# Patient Record
Sex: Female | Born: 1970 | Race: White | Hispanic: No | Smoking: Current some day smoker
Health system: Southern US, Community
[De-identification: ages and names within clinical notes are randomized; demographics above are authoritative.]

## PROBLEM LIST (undated history)

## (undated) DIAGNOSIS — K219 Gastro-esophageal reflux disease without esophagitis: Secondary | ICD-10-CM

## (undated) DIAGNOSIS — I10 Essential (primary) hypertension: Secondary | ICD-10-CM

## (undated) DIAGNOSIS — O009 Unspecified ectopic pregnancy without intrauterine pregnancy: Secondary | ICD-10-CM

## (undated) DIAGNOSIS — E785 Hyperlipidemia, unspecified: Secondary | ICD-10-CM

## (undated) HISTORY — DX: Hyperlipidemia, unspecified: E78.5

## (undated) HISTORY — PX: OTHER SURGICAL HISTORY: SHX169

## (undated) HISTORY — DX: Unspecified ectopic pregnancy without intrauterine pregnancy: O00.90

## (undated) HISTORY — DX: Essential (primary) hypertension: I10

## (undated) HISTORY — DX: Gastro-esophageal reflux disease without esophagitis: K21.9

## (undated) HISTORY — PX: ECTOPIC PREGNANCY SURGERY: SHX613

---

## 2002-03-25 ENCOUNTER — Ambulatory Visit (HOSPITAL_COMMUNITY): Admission: AD | Admit: 2002-03-25 | Discharge: 2002-03-25 | Payer: Self-pay | Admitting: Obstetrics and Gynecology

## 2002-03-25 ENCOUNTER — Encounter (INDEPENDENT_AMBULATORY_CARE_PROVIDER_SITE_OTHER): Payer: Self-pay | Admitting: Specialist

## 2002-03-25 ENCOUNTER — Encounter: Payer: Self-pay | Admitting: Obstetrics and Gynecology

## 2002-07-12 ENCOUNTER — Other Ambulatory Visit: Admission: RE | Admit: 2002-07-12 | Discharge: 2002-07-12 | Payer: Self-pay | Admitting: Obstetrics and Gynecology

## 2002-08-20 ENCOUNTER — Encounter: Payer: Self-pay | Admitting: Obstetrics and Gynecology

## 2002-08-20 ENCOUNTER — Ambulatory Visit (HOSPITAL_COMMUNITY): Admission: RE | Admit: 2002-08-20 | Discharge: 2002-08-20 | Payer: Self-pay | Admitting: Obstetrics and Gynecology

## 2003-04-21 ENCOUNTER — Encounter: Payer: Self-pay | Admitting: Obstetrics and Gynecology

## 2003-04-21 ENCOUNTER — Ambulatory Visit (HOSPITAL_COMMUNITY): Admission: AD | Admit: 2003-04-21 | Discharge: 2003-04-21 | Payer: Self-pay | Admitting: Obstetrics and Gynecology

## 2003-04-21 ENCOUNTER — Encounter (INDEPENDENT_AMBULATORY_CARE_PROVIDER_SITE_OTHER): Payer: Self-pay

## 2003-04-24 ENCOUNTER — Encounter: Admission: RE | Admit: 2003-04-24 | Discharge: 2003-04-24 | Payer: Self-pay | Admitting: Obstetrics and Gynecology

## 2003-04-24 ENCOUNTER — Encounter: Payer: Self-pay | Admitting: Obstetrics and Gynecology

## 2003-07-17 ENCOUNTER — Other Ambulatory Visit: Admission: RE | Admit: 2003-07-17 | Discharge: 2003-07-17 | Payer: Self-pay | Admitting: Obstetrics and Gynecology

## 2003-07-22 ENCOUNTER — Encounter: Admission: RE | Admit: 2003-07-22 | Discharge: 2003-07-22 | Payer: Self-pay | Admitting: Obstetrics and Gynecology

## 2004-07-17 ENCOUNTER — Other Ambulatory Visit: Admission: RE | Admit: 2004-07-17 | Discharge: 2004-07-17 | Payer: Self-pay | Admitting: Obstetrics and Gynecology

## 2004-08-20 ENCOUNTER — Ambulatory Visit: Payer: Self-pay | Admitting: Professional

## 2005-03-01 ENCOUNTER — Ambulatory Visit: Payer: Self-pay | Admitting: Internal Medicine

## 2005-03-02 ENCOUNTER — Encounter: Admission: RE | Admit: 2005-03-02 | Discharge: 2005-03-02 | Payer: Self-pay | Admitting: Internal Medicine

## 2005-03-02 IMAGING — CT CT PELVIS W/ CM
1 of 4 series · 12 of 32 positions shown, 18 images · IV contrast (READICAT/WATER)
Comparison: none

CLINICAL DATA: One week right lower quadrant pain.  Hematuria.  Post op LAP x?s 3.  Nausea.  Diarrhea. 
ABDOMINAL CT PRE AND POST CONTRAST:
TECHNIQUE: Multidetector CT imaging of the abdomen was performed both before and during bolus administration of intravenous contrast.
Contrast:  125 cc Omnipaque 300
TECHNIQUE: Multidetector CT imaging of the pelvis was performed following the standard protocol during bolus administration of intravenous contrast.

[Series 3: routine abdomen · axial · 0.84mm/px · z∈[-429,-29]mm · 12 of 94 slices shown, 18 images]
[im 7/94  soft-tissue]
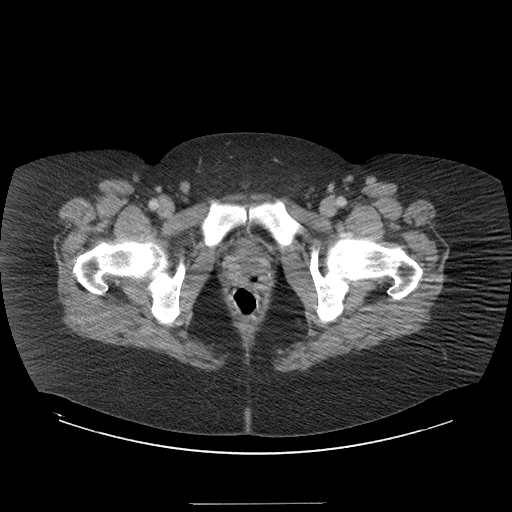
[im 7/94  bone]
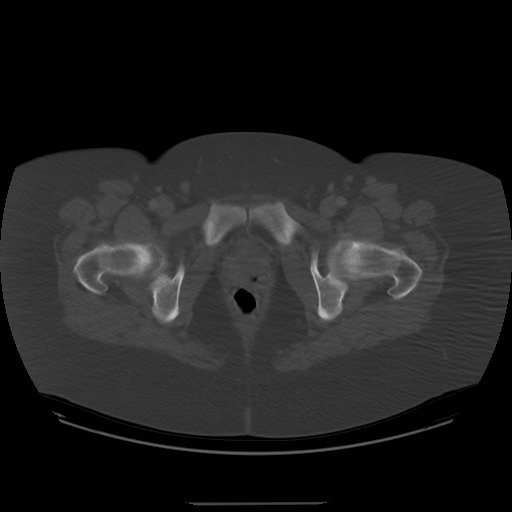
[im 14/94  soft-tissue]
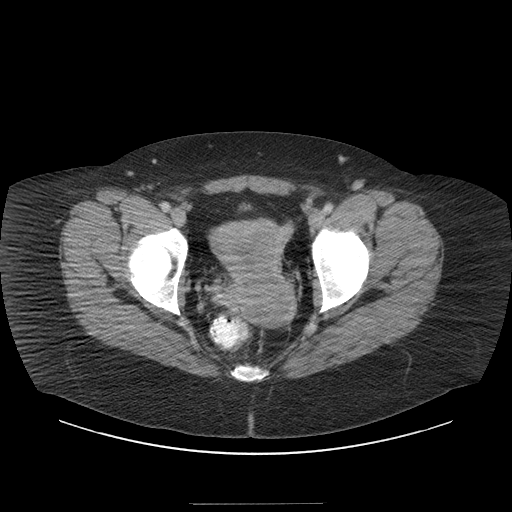
[im 20/94  soft-tissue]
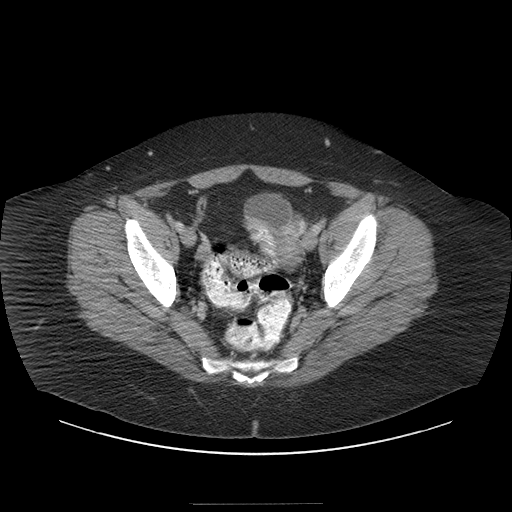
[im 27/94  soft-tissue]
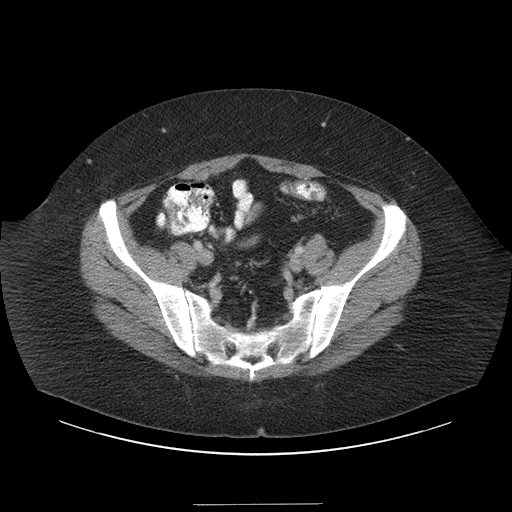
[im 34/94  soft-tissue]
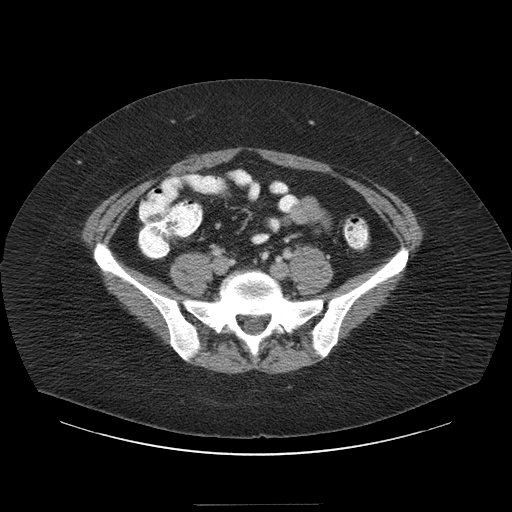
[im 40/94  soft-tissue]
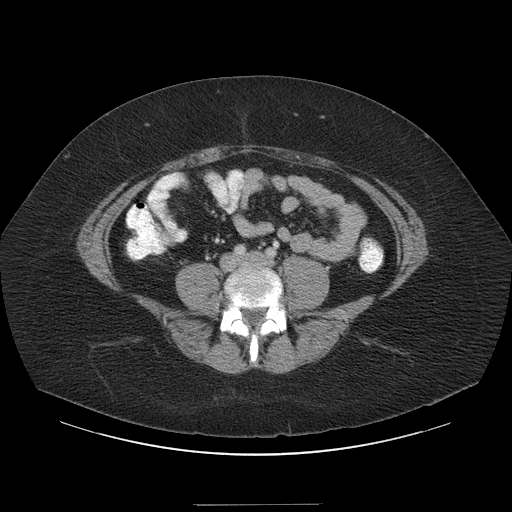
[im 54/94  soft-tissue]
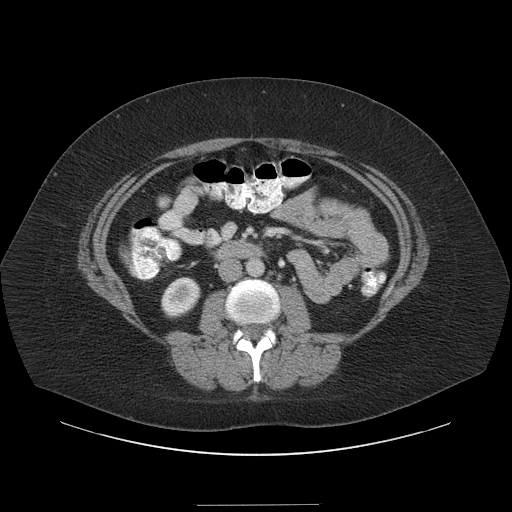
[im 60/94  soft-tissue]
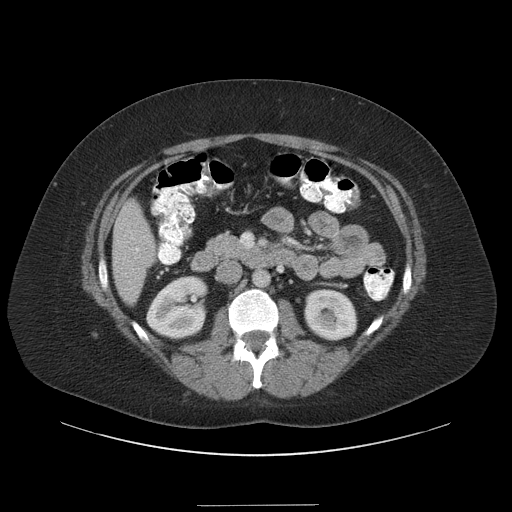
[im 67/94  soft-tissue]
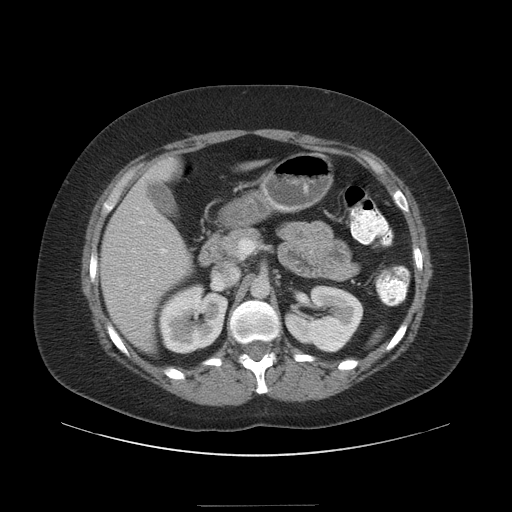
[im 67/94  lung]
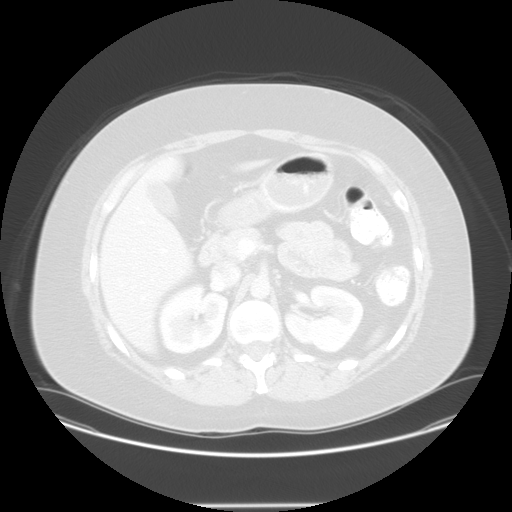
[im 67/94  bone]
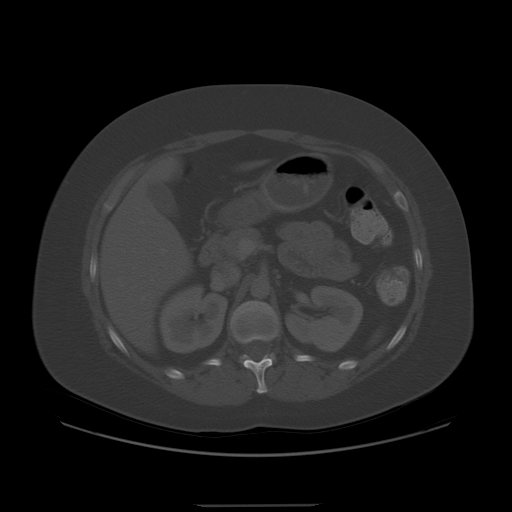
[im 74/94  soft-tissue]
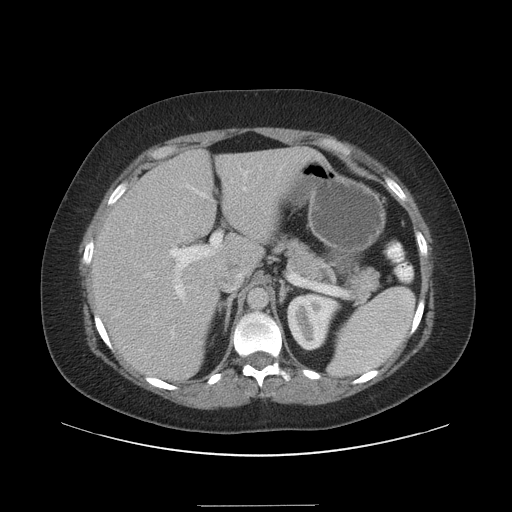
[im 74/94  lung]
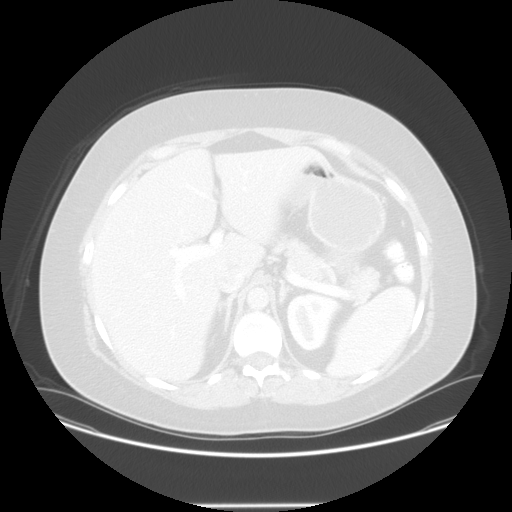
[im 80/94  soft-tissue]
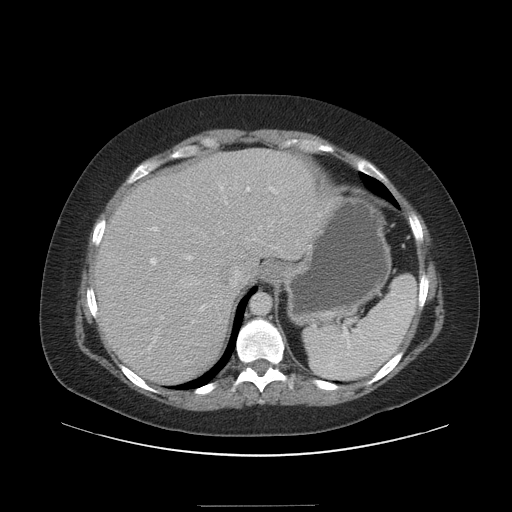
[im 80/94  lung]
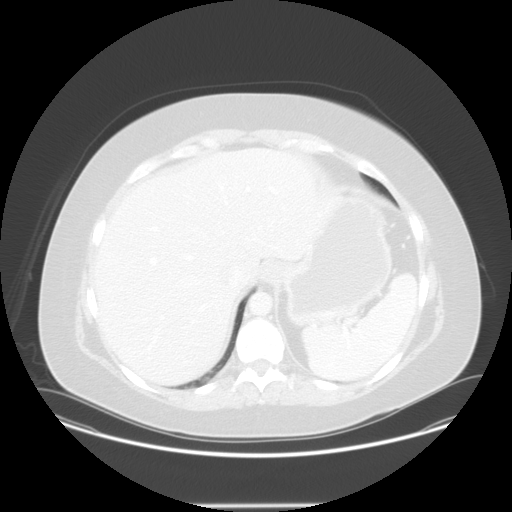
[im 87/94  soft-tissue]
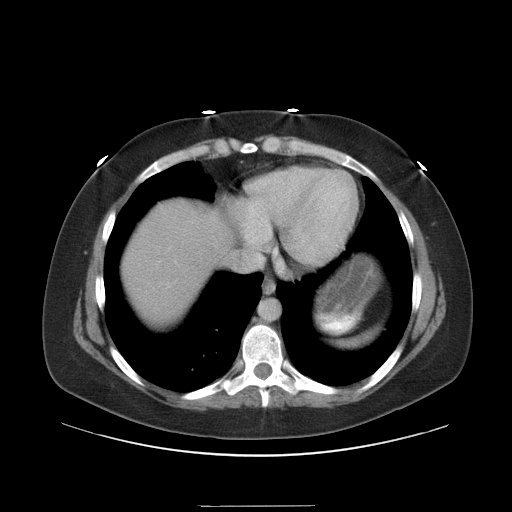
[im 87/94  lung]
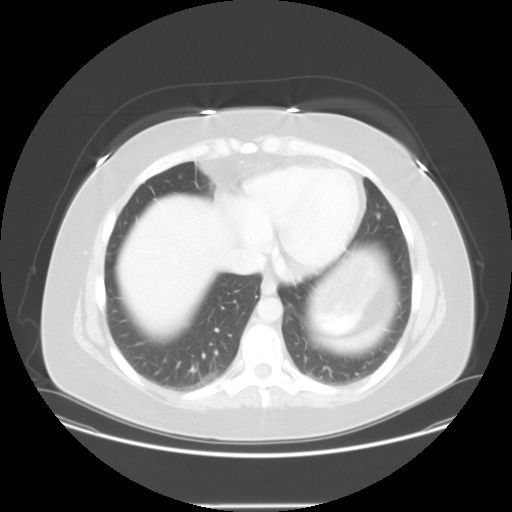

[12 of 32 positions shown; findings below may reference images not displayed]

FINDINGS: There is no evidence of renal calculi or hydronephrosis.  There is no evidence of mass,inflammatory process, or abdominal fluid collection.  
Since previous [REDACTED] abdominal CT report, [DATE], the previous 5 mm posterior right hepatic low density focus is not seen probably due to different slice level currently.  Slight diffuse fatty infiltration of the liver is noted.  Cause of microhematuria is not determined on current study.  Left retroperitoneal lymph nodes measure upper limits of normal with reference lymph node measuring 10 x 7 mm (image 43).
IMPRESSION: Since previous [REDACTED] abdominal CT report of [DATE]:
1.  Nonvisualization of previous 5 mm likely hepatic cyst with current slight diffuse fatty infiltration of the liver.
2.  Retroperitoneal lymph nodes upper limits of normal.
3.  Otherwise negative.
PELVIS CT WITH CONTRAST:
FINDINGS: Since previous [REDACTED] pelvic CT report of [DATE], there is resolution of previously described 15 mm right ovarian cyst.  Interval development of multiloculated complex left ovarian cystic mass measures approximately 4 cm long x 7.3 cm AP x 4.3 cm wide (image 76).  Remaining opacified intestine, uterus, right adnexa and bladder are normal by CT.  No inflammation, free fluid, nor adenopathy is seen.  No residual post procedure periumbilical abdominal wall changes are currently noted.  Incidental small left inguinal hernia containing fat only is seen.
IMPRESSION: Since previous [REDACTED] pelvic CT report of [DATE]:
1.  Interval resolution post procedure periumbilical abdominal wall changes and resolution of previous 15 mm right ovarian cyst. 
2.  Development of complex left ovarian cystic mass consistent with primary benign or malignant cystic ovarian tumors. 
3.  Small left inguinal hernia.
4.  Otherwise negative ? etiology of microhematuria is not determined on current study.

## 2005-03-03 ENCOUNTER — Encounter: Admission: RE | Admit: 2005-03-03 | Discharge: 2005-03-03 | Payer: Self-pay | Admitting: Obstetrics and Gynecology

## 2005-03-03 IMAGING — US US TRANSVAGINAL NON-OB
1 series · 13 of 25 positions shown · non-contrast
Comparison: CT abdomen and pelvis [DATE].

CLINICAL DATA: Followup left ovarian mass identified on recent CT.

TRANSABDOMINAL AND TRANSVAGINAL PELVIC ULTRASOUND [DATE]:
TECHNIQUE: Initially, transabdominal imaging was performed using the bladder as
an acoustical window. Subsequently, the bladder was emptied and transvaginal
imaging was performed.

[Series 1: unknown · 0.26mm/px · 13 of 47 slices shown]
[im 1/47]
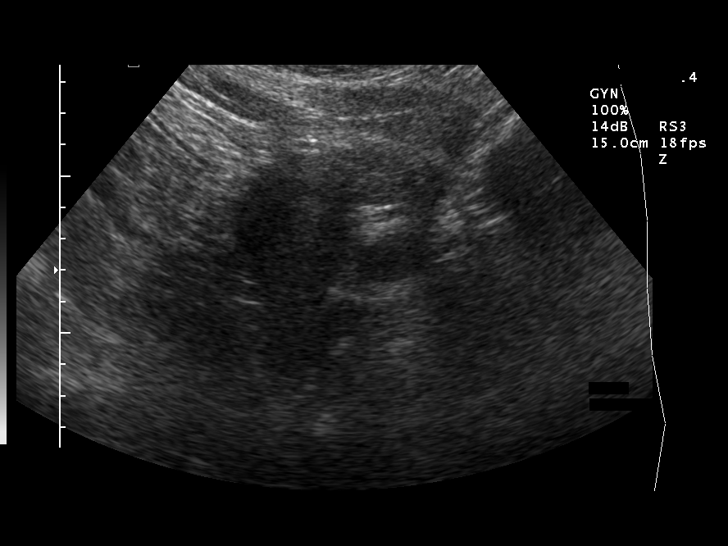
[im 4/47]
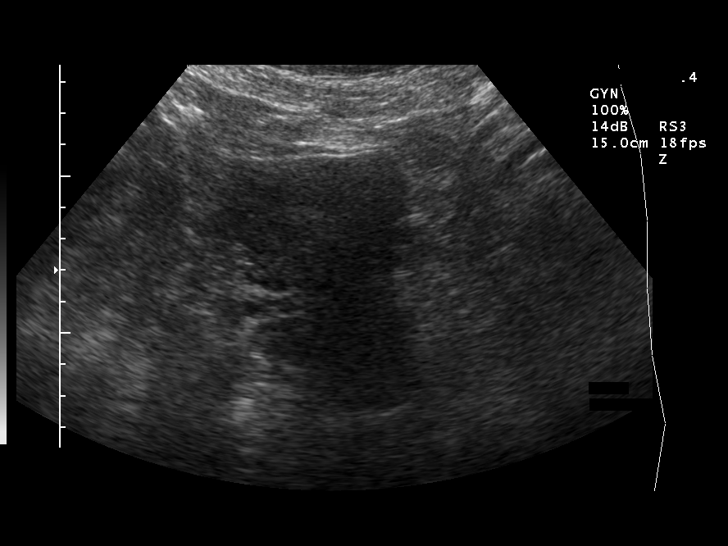
[im 8/47]
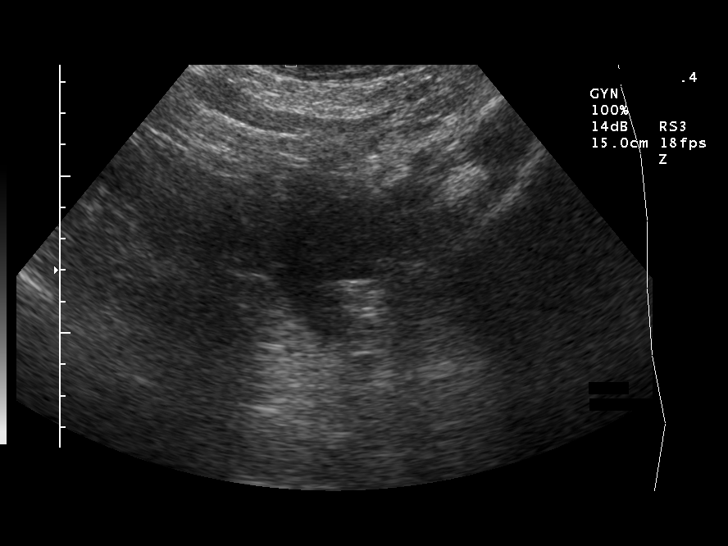
[im 12/47]
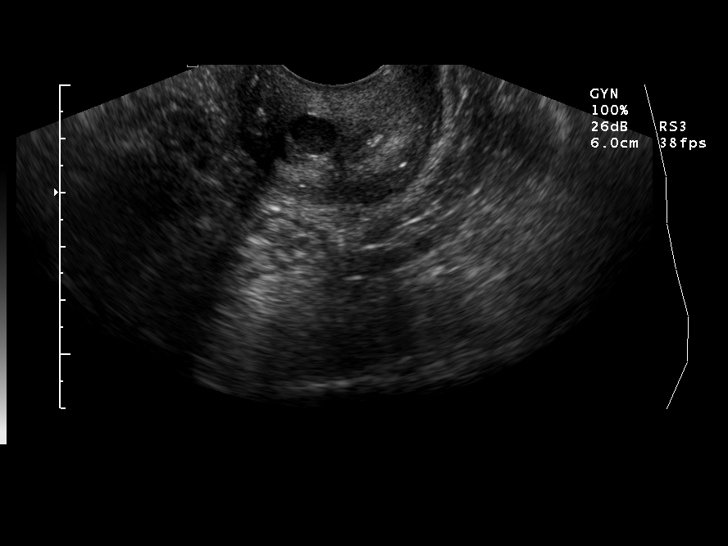
[im 16/47]
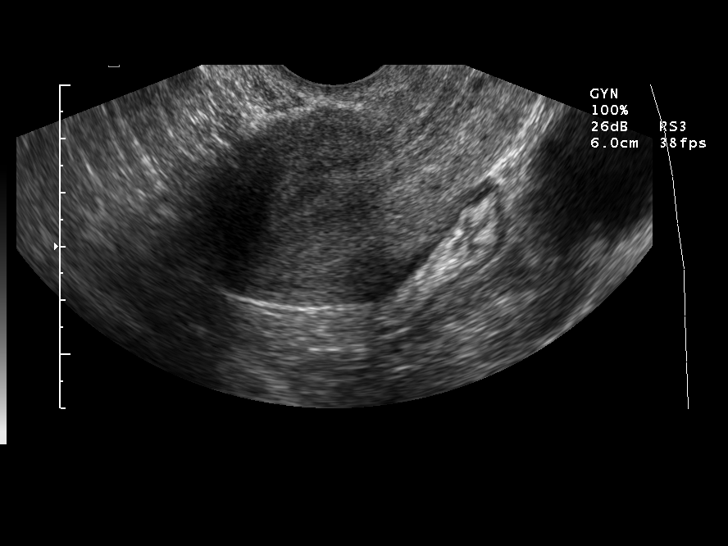
[im 20/47]
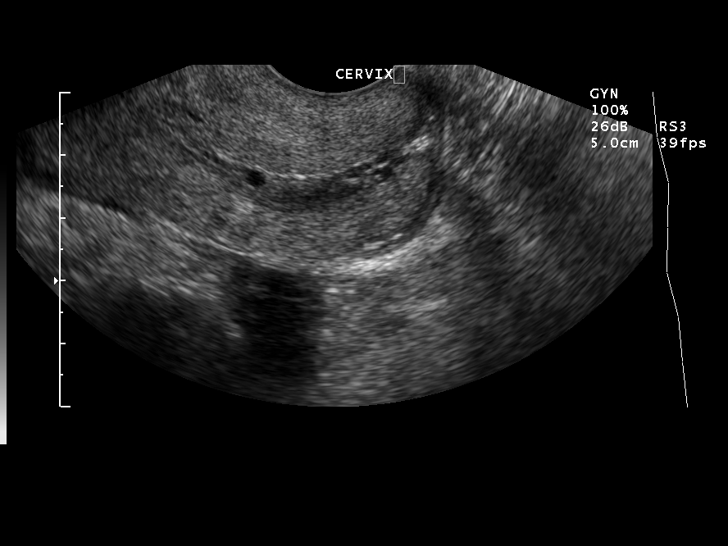
[im 24/47]
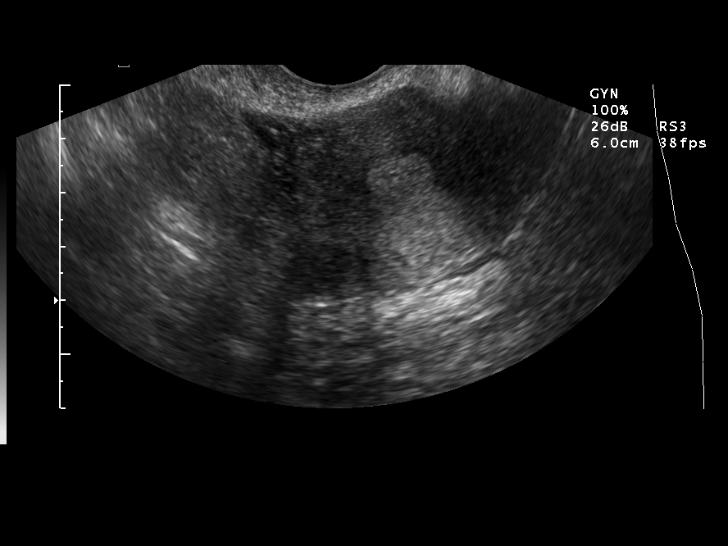
[im 27/47]
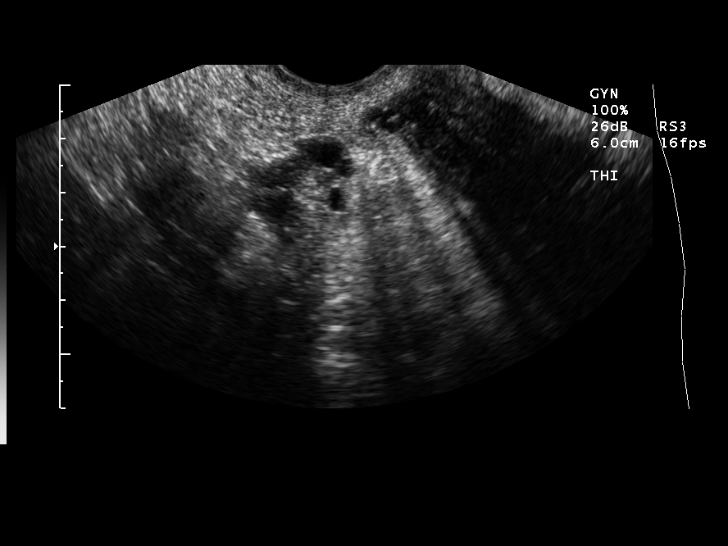
[im 31/47]
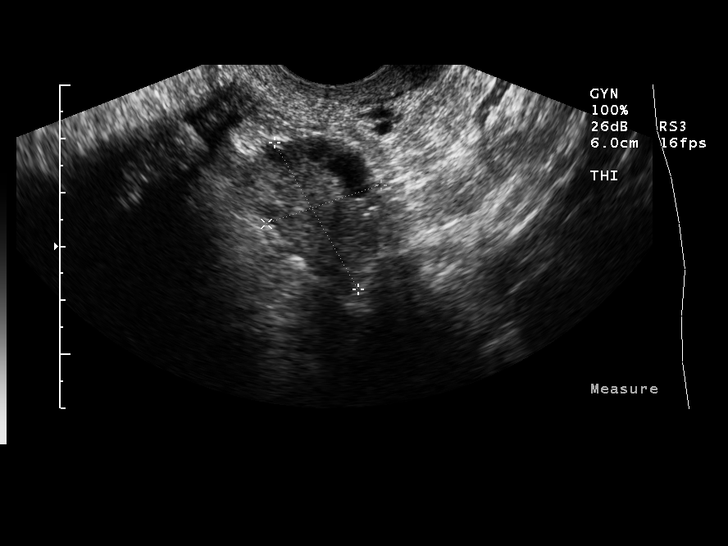
[im 35/47]
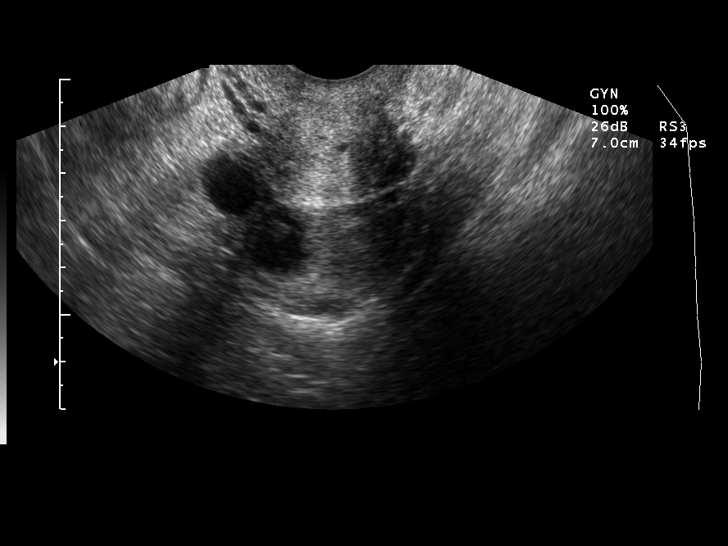
[im 39/47]
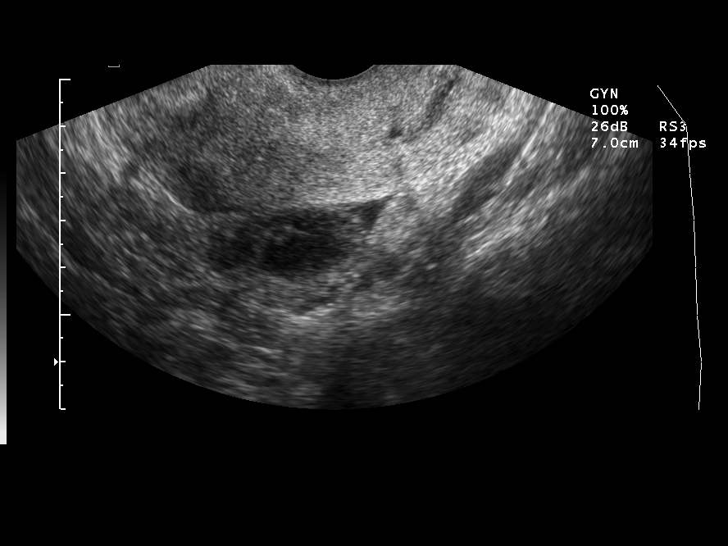
[im 43/47]
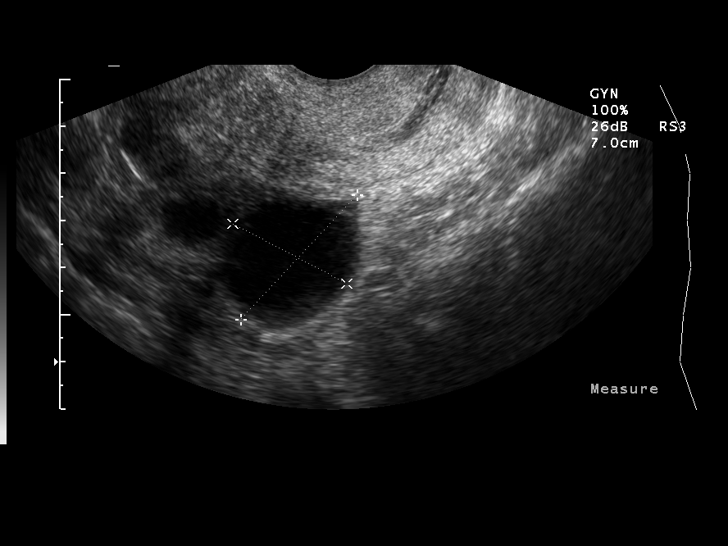
[im 47/47]
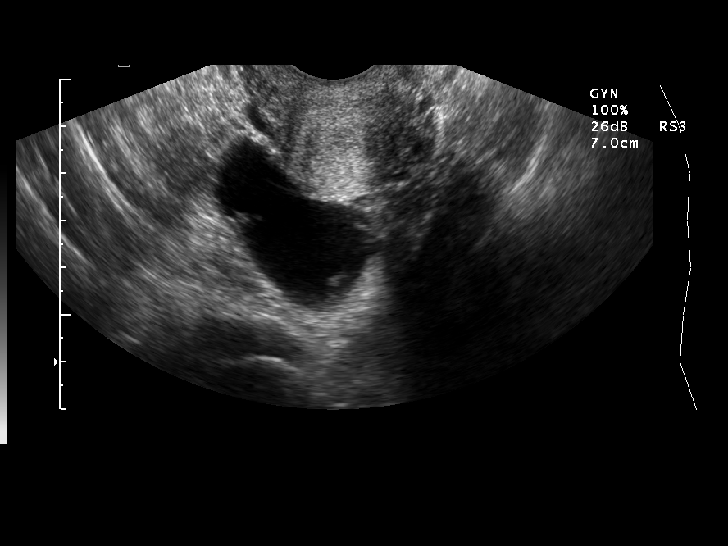

[13 of 25 positions shown; findings below may reference images not displayed]

FINDINGS: Ultrasound confirms a mixed cystic and solid lesion arising from the
left ovary. The cystic component has thick septations. The 2 largest cystic foci
measure approximately 3.6 x 2.7 x 3.3 cm and 2.0 x 1.0 x 1.7 cm. The solid
component measures approximately 3.4 x 2.4 x 4.2 cm. Within the cystic lesions,
there are scattered mural nodules.

The right ovary is normal in size and contains several subcentimeter follicular
cysts. It measures approximately 2.2 x 3.1 x 2.1 cm. No right ovarian masses are
identified. No free fluid was identified.

The uterus is normal in size measuring approximately 7.3 x 3.7 x 5.9 cm. There
is a solitary posterior fundal fibroid measuring 1.4 x 0.9 x 1.4 cm. The
endometrium is normal in appearance and measures approximately 8 mm in
thickness. Nabothian cyst is noted on the cervix.
IMPRESSION: 1. Mixed cystic and solid lesion arising from the left ovary as noted on the CT
yesterday. This is worrisome for an ovarian neoplasm.
2. Normal appearing right ovary.
3. Solitary 1.4 cm posterior fundal uterine fibroid. 
4. Nabothian cyst on the cervix.

## 2005-03-05 ENCOUNTER — Ambulatory Visit: Payer: Self-pay | Admitting: Internal Medicine

## 2005-04-13 ENCOUNTER — Observation Stay (HOSPITAL_COMMUNITY): Admission: RE | Admit: 2005-04-13 | Discharge: 2005-04-14 | Payer: Self-pay | Admitting: Obstetrics and Gynecology

## 2005-07-30 ENCOUNTER — Other Ambulatory Visit: Admission: RE | Admit: 2005-07-30 | Discharge: 2005-07-30 | Payer: Self-pay | Admitting: Obstetrics and Gynecology

## 2005-12-08 ENCOUNTER — Encounter: Payer: Self-pay | Admitting: Obstetrics and Gynecology

## 2006-08-12 ENCOUNTER — Ambulatory Visit: Payer: Self-pay | Admitting: Internal Medicine

## 2006-11-14 ENCOUNTER — Ambulatory Visit: Payer: Self-pay | Admitting: Internal Medicine

## 2007-02-16 ENCOUNTER — Ambulatory Visit: Payer: Self-pay | Admitting: Internal Medicine

## 2007-04-03 ENCOUNTER — Ambulatory Visit: Payer: Self-pay | Admitting: Internal Medicine

## 2007-04-03 DIAGNOSIS — R51 Headache: Secondary | ICD-10-CM | POA: Insufficient documentation

## 2007-04-03 DIAGNOSIS — R519 Headache, unspecified: Secondary | ICD-10-CM | POA: Insufficient documentation

## 2007-04-03 LAB — CONVERTED CEMR LAB
BUN: 7 mg/dL (ref 6–23)
Basophils Absolute: 0.1 10*3/uL (ref 0.0–0.1)
Basophils Relative: 0.6 % (ref 0.0–1.0)
CO2: 22 meq/L (ref 19–32)
Calcium: 9.9 mg/dL (ref 8.4–10.5)
Chloride: 107 meq/L (ref 96–112)
Creatinine, Ser: 0.8 mg/dL (ref 0.4–1.2)
Eosinophils Absolute: 0.1 10*3/uL (ref 0.0–0.6)
Eosinophils Relative: 1.1 % (ref 0.0–5.0)
GFR calc Af Amer: 104 mL/min
GFR calc non Af Amer: 86 mL/min
Glucose, Bld: 87 mg/dL (ref 70–99)
HCT: 39.5 % (ref 36.0–46.0)
Hemoglobin: 13.4 g/dL (ref 12.0–15.0)
INR: 0.9 (ref 0.8–1.0)
Lymphocytes Relative: 31 % (ref 12.0–46.0)
MCHC: 34.1 g/dL (ref 30.0–36.0)
MCV: 91.5 fL (ref 78.0–100.0)
Monocytes Absolute: 0.4 10*3/uL (ref 0.2–0.7)
Monocytes Relative: 3.8 % (ref 3.0–11.0)
Neutro Abs: 7.1 10*3/uL (ref 1.4–7.7)
Neutrophils Relative %: 63.5 % (ref 43.0–77.0)
Platelets: 362 10*3/uL (ref 150–400)
Potassium: 4.1 meq/L (ref 3.5–5.1)
Prothrombin Time: 11.3 s (ref 10.9–13.3)
RBC: 4.31 M/uL (ref 3.87–5.11)
RDW: 12.7 % (ref 11.5–14.6)
Sed Rate: 10 mm/hr (ref 0–25)
Sodium: 138 meq/L (ref 135–145)
WBC: 11.2 10*3/uL — ABNORMAL HIGH (ref 4.5–10.5)
aPTT: 30.1 s — ABNORMAL HIGH (ref 21.7–29.8)

## 2007-04-06 ENCOUNTER — Ambulatory Visit: Payer: Self-pay | Admitting: Cardiology

## 2007-04-06 IMAGING — CT CT HEAD W/O CM
1 series · 16 of 30 positions shown, 20 images · non-contrast
Comparison: none

CLINICAL DATA: Frontal headaches, nausea, foot numbness

[Series 2: head_seq 4.5 h37s st · axial · 0.43mm/px · z∈[-153,-9]mm · 16 of 36 slices shown, 20 images]
[im 2/36  brain]
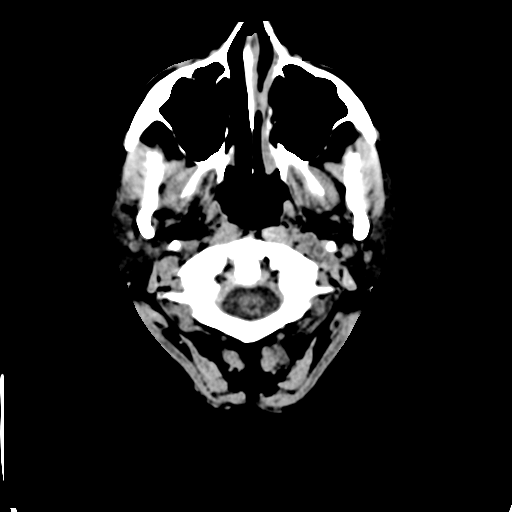
[im 2/36  bone]
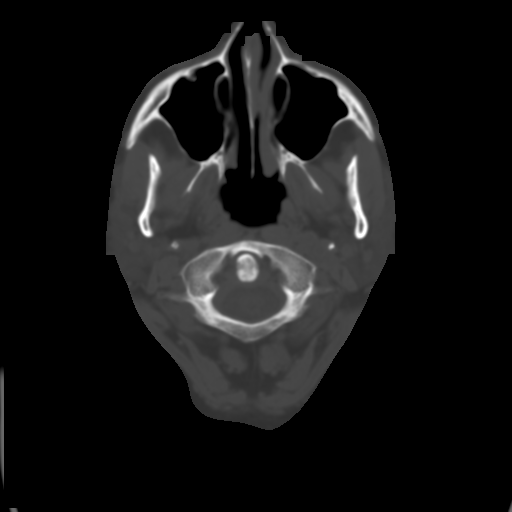
[im 4/36  brain]
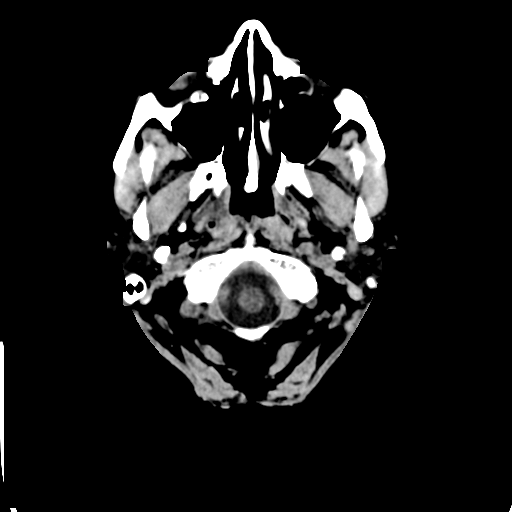
[im 7/36  brain]
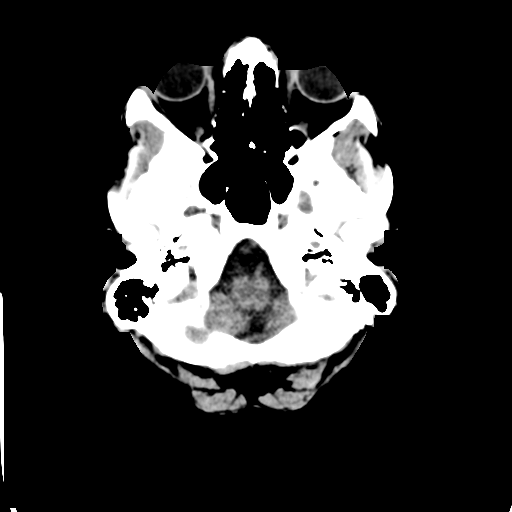
[im 9/36  brain]
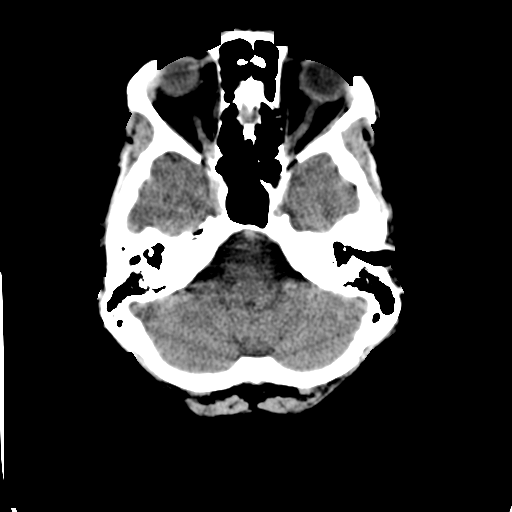
[im 10/36  brain]
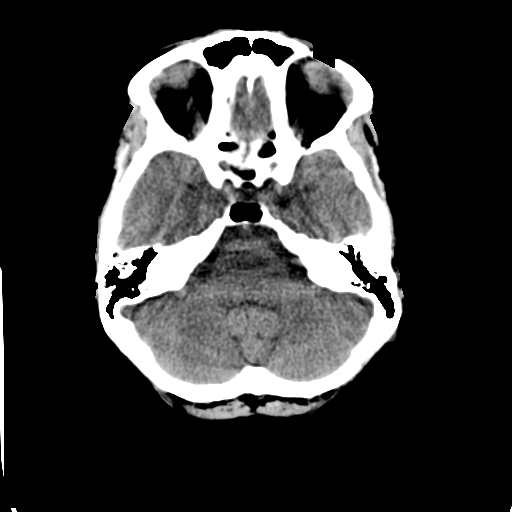
[im 10/36  bone]
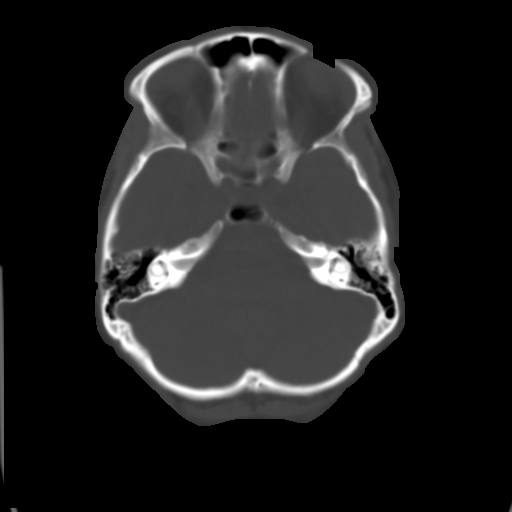
[im 13/36  brain]
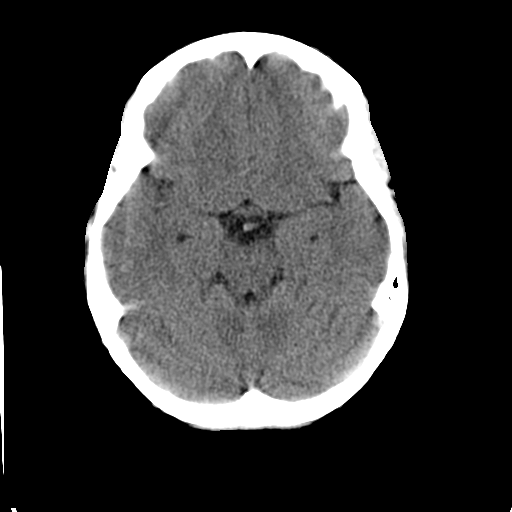
[im 15/36  brain]
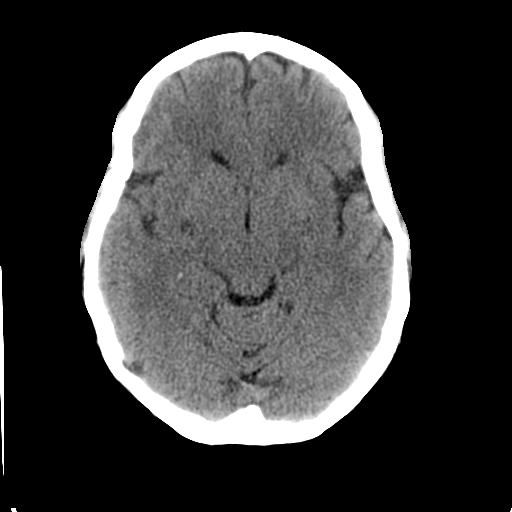
[im 17/36  brain]
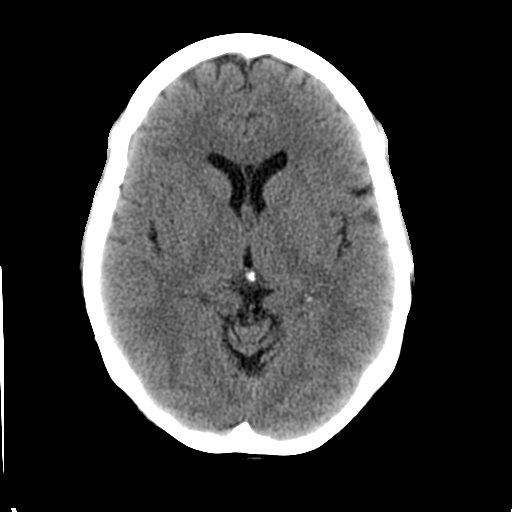
[im 19/36  brain]
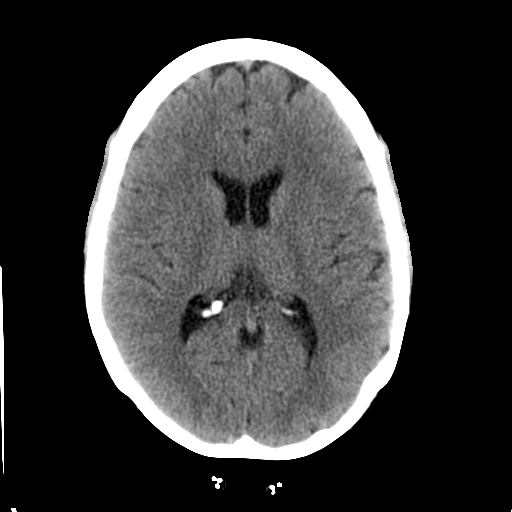
[im 19/36  bone]
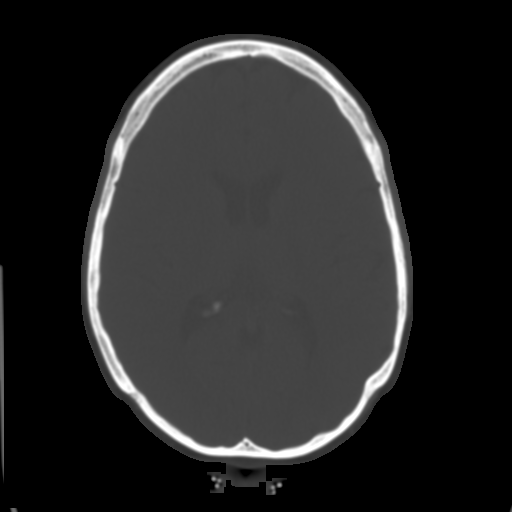
[im 21/36  brain]
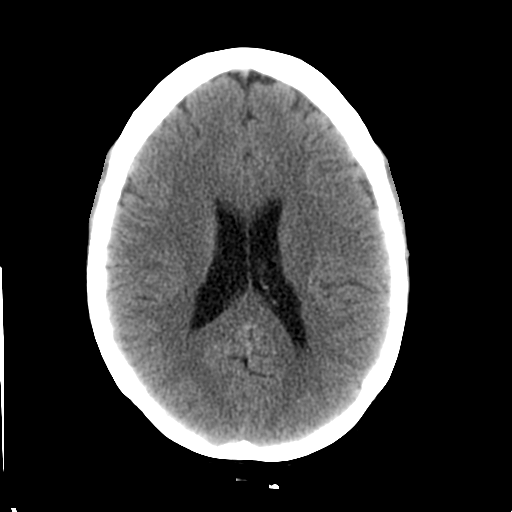
[im 23/36  brain]
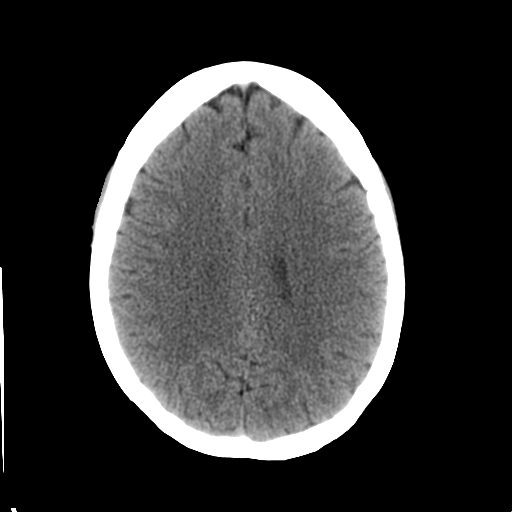
[im 26/36  brain]
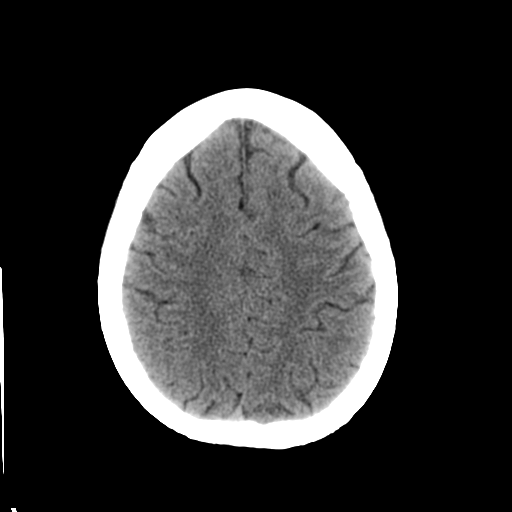
[im 27/36  brain]
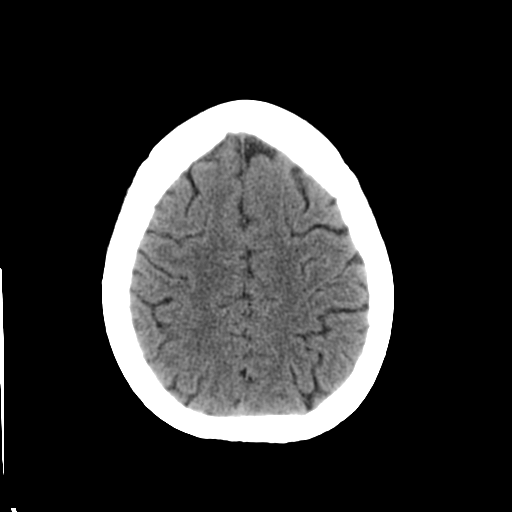
[im 27/36  bone]
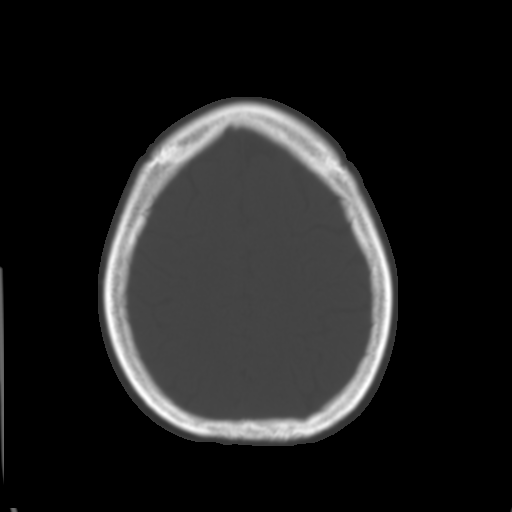
[im 29/36  brain]
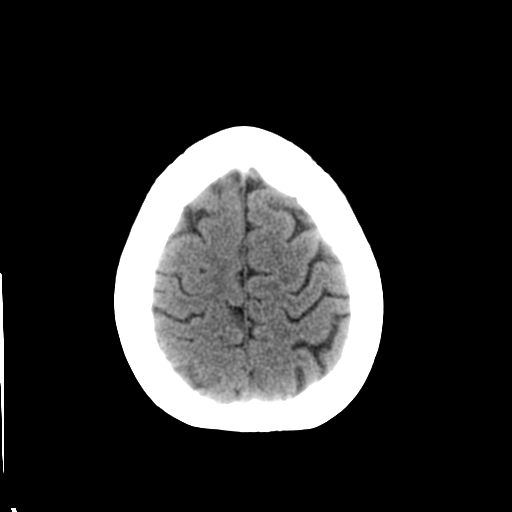
[im 32/36  brain]
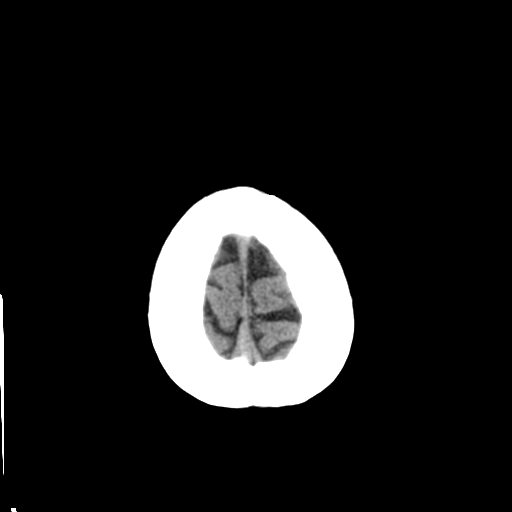
[im 34/36  brain]
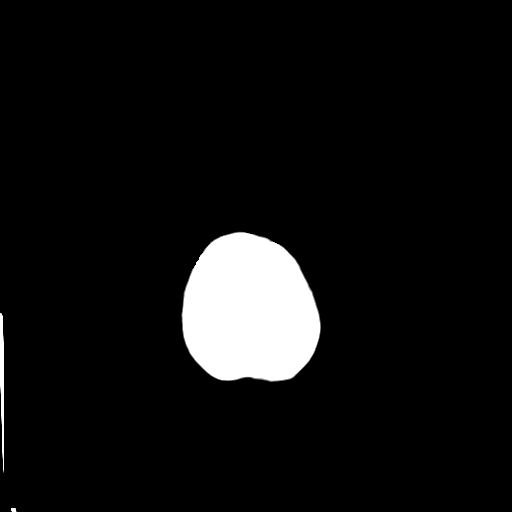

[16 of 30 positions shown; findings below may reference images not displayed]

CT head without contrast:

No previous for comparison.  There is no evidence of acute intracranial
hemorrhage, brain edema, mass,  mass effect, or midline shift. Acute infarct may
be inapparent on noncontrast CT.  No other intra-axial abnormalities are seen,
and the ventricles and sulci are within normal limits in size and symmetry.   No
abnormal extra-axial fluid collections or masses are identified.  No significant
calvarial abnormality.
IMPRESSION: 1. Negative non-contrast head CT.

## 2007-04-12 ENCOUNTER — Telehealth: Payer: Self-pay | Admitting: Internal Medicine

## 2007-07-03 ENCOUNTER — Telehealth: Payer: Self-pay | Admitting: Internal Medicine

## 2007-07-03 ENCOUNTER — Ambulatory Visit: Payer: Self-pay | Admitting: Internal Medicine

## 2007-07-03 DIAGNOSIS — R079 Chest pain, unspecified: Secondary | ICD-10-CM | POA: Insufficient documentation

## 2007-07-04 ENCOUNTER — Telehealth: Payer: Self-pay | Admitting: Internal Medicine

## 2007-07-04 ENCOUNTER — Encounter: Payer: Self-pay | Admitting: Internal Medicine

## 2007-07-04 ENCOUNTER — Encounter: Payer: Self-pay | Admitting: *Deleted

## 2007-09-24 DIAGNOSIS — J069 Acute upper respiratory infection, unspecified: Secondary | ICD-10-CM | POA: Insufficient documentation

## 2007-09-26 DIAGNOSIS — J45909 Unspecified asthma, uncomplicated: Secondary | ICD-10-CM | POA: Insufficient documentation

## 2007-09-28 ENCOUNTER — Ambulatory Visit: Payer: Self-pay | Admitting: Family Medicine

## 2007-09-28 DIAGNOSIS — F17200 Nicotine dependence, unspecified, uncomplicated: Secondary | ICD-10-CM | POA: Insufficient documentation

## 2007-10-02 IMAGING — CR DG RIBS 2V*R*
3 series · 3 of 3 positions shown · non-contrast
Comparison: none

CLINICAL DATA: Right rib pain after a fall.  
 RIGHT RIBS ? 3 VIEW ? [DATE]

[view not recorded (1 of 3)]
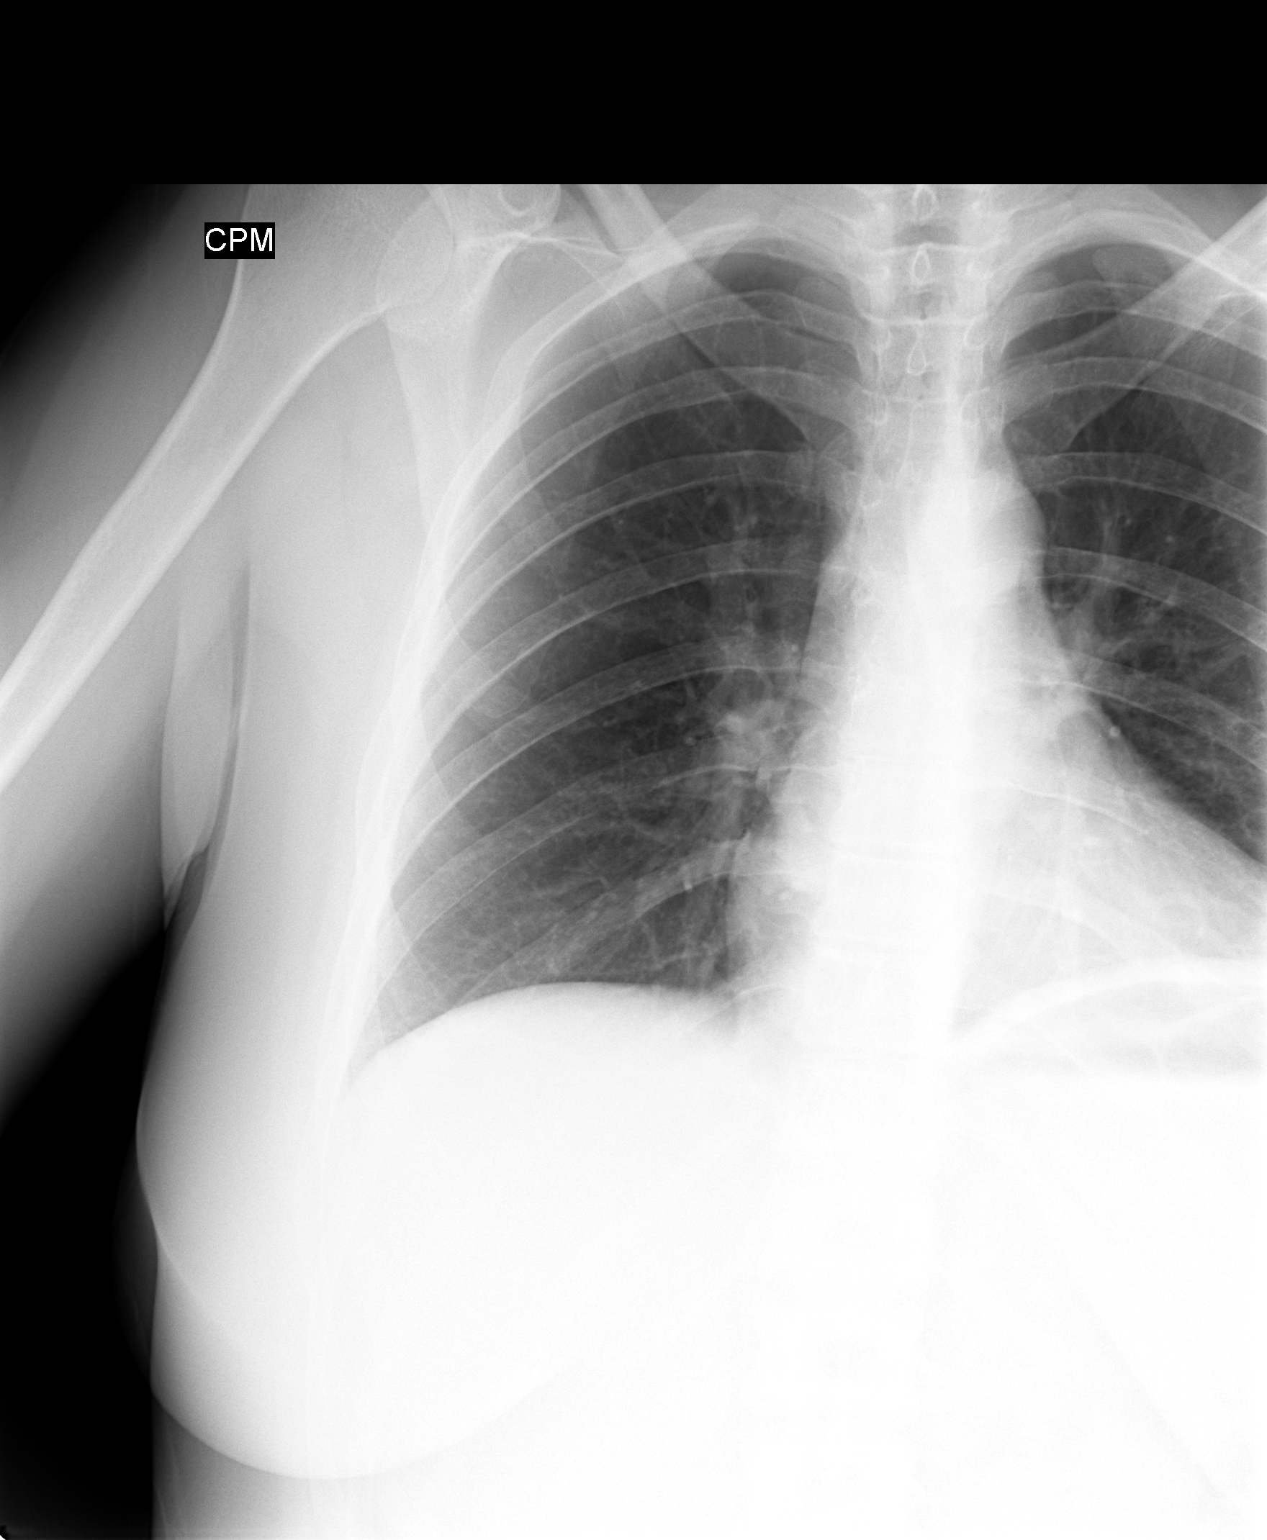

[view not recorded (2 of 3)]
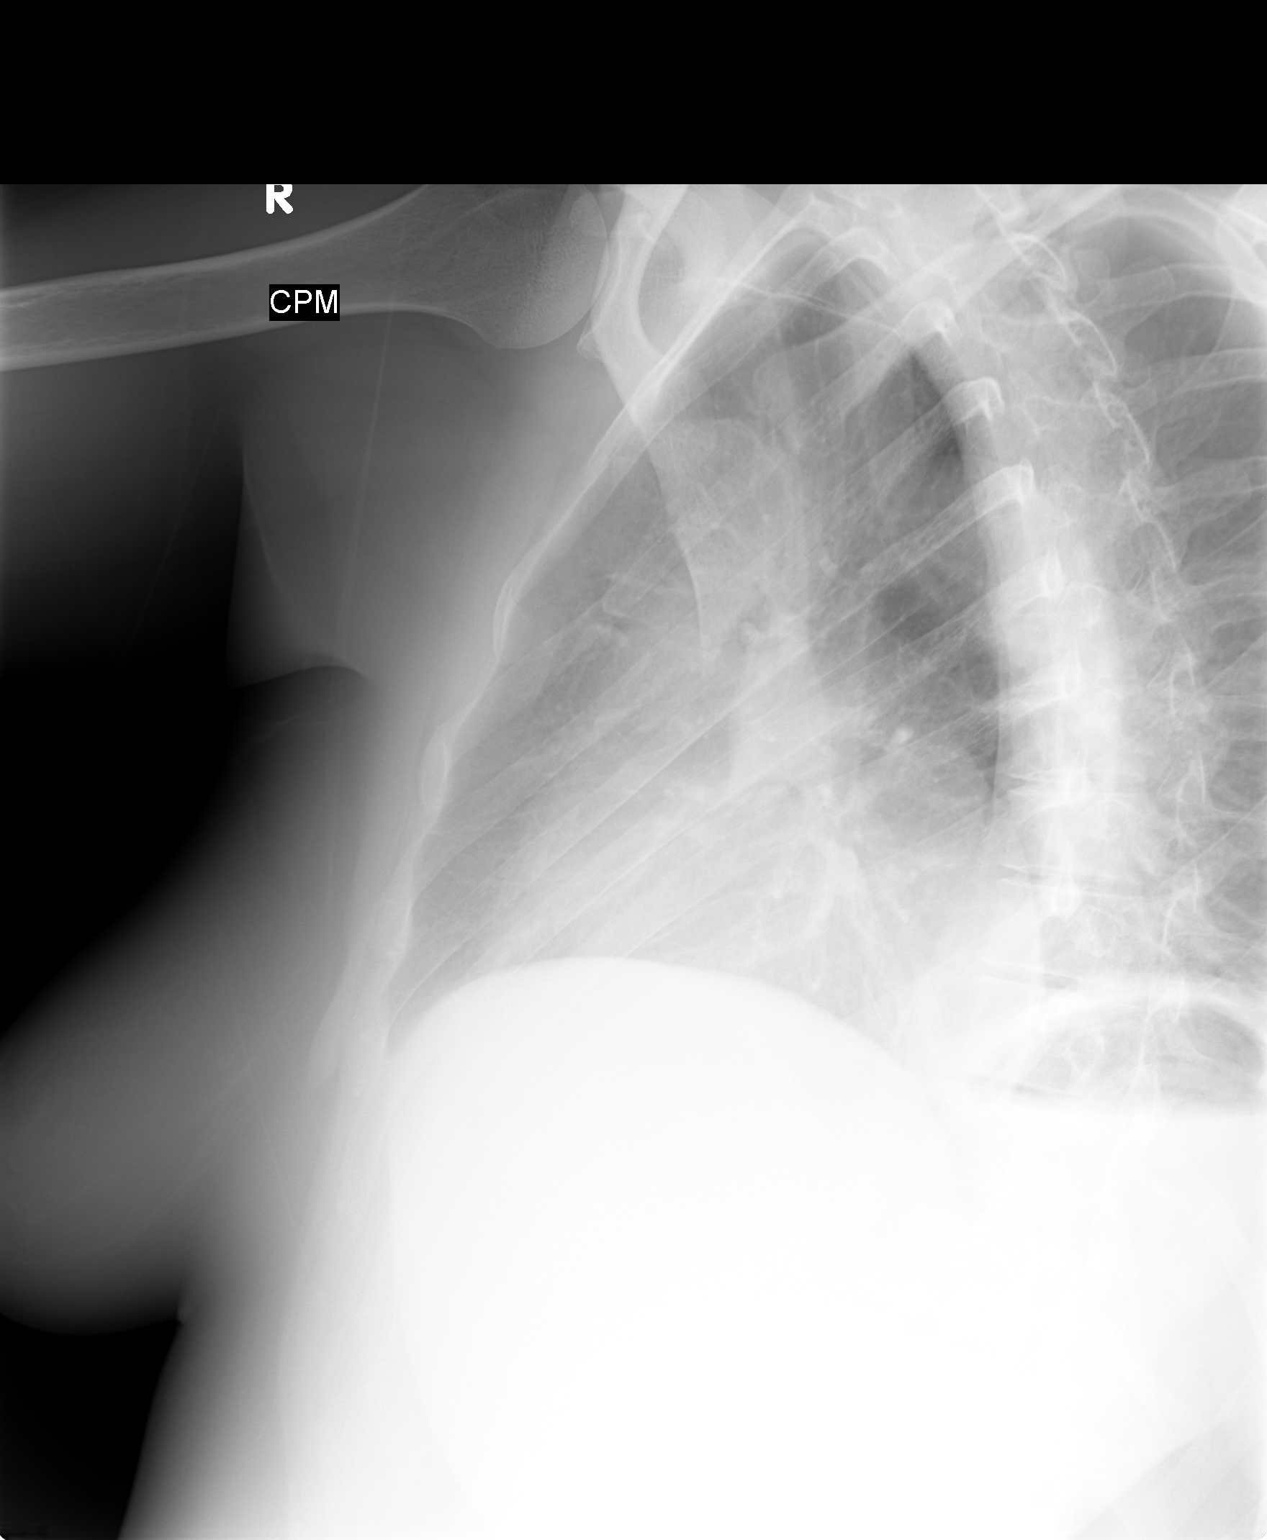

[view not recorded (3 of 3)]
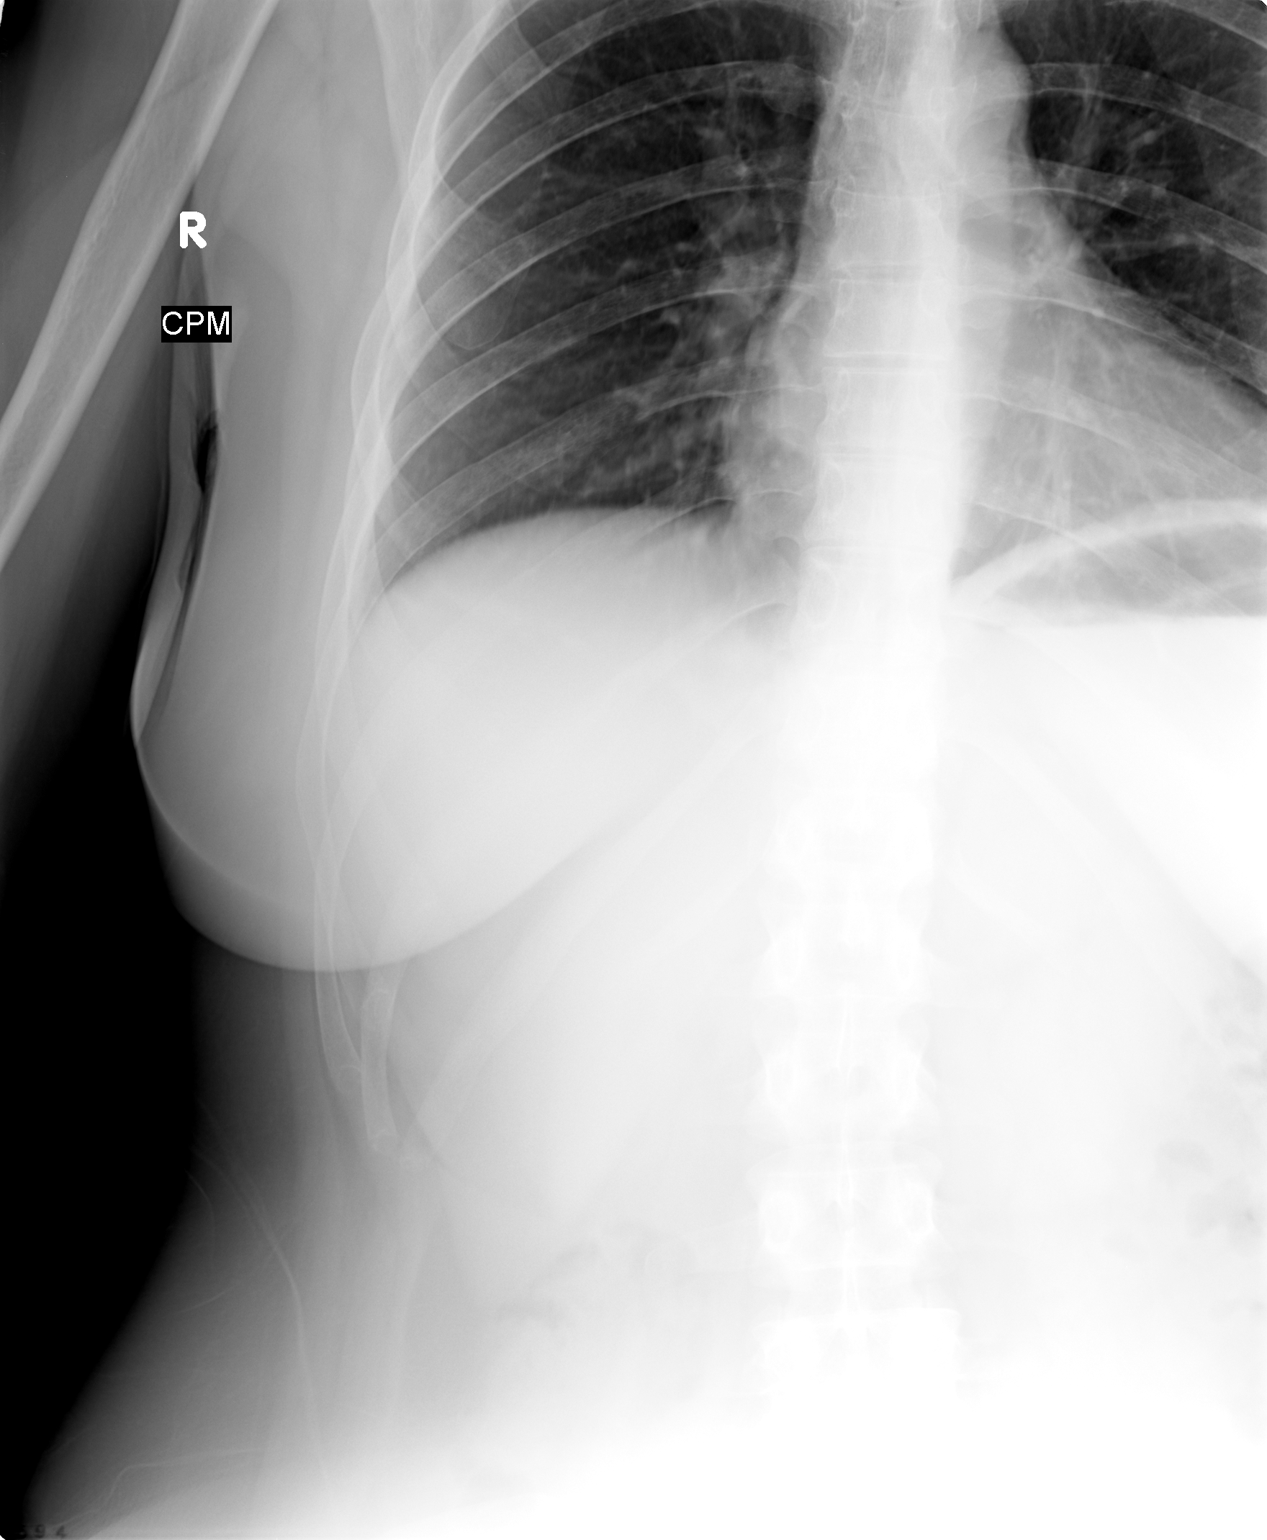

[3 of 3 positions shown; findings below may reference images not displayed]

FINDINGS: No acute rib fracture.
IMPRESSION: No acute rib fracture.

## 2007-11-02 ENCOUNTER — Encounter: Admission: RE | Admit: 2007-11-02 | Discharge: 2007-11-02 | Payer: Self-pay | Admitting: Obstetrics and Gynecology

## 2007-11-02 IMAGING — MG MM DIAGNOSTIC BILATERAL
5 series · 5 of 5 positions shown · non-contrast
Comparison: Patient's prior exam from [DATE] is used for correlation.

DG DIAGNOSTIC BILATERAL
Bilateral CC and MLO view(s) were taken.

LEFT BREAST ULTRASOUND
Technologist: DIZ, Medical
DIGITAL BILATERAL DIAGNOSTIC MAMMOGRAM: AND LEFT BREAST ULTRASOUND:
CLINICAL DATA: Patient presents for diagnostic bilateral mammogram due to a new palpable 
abnormality over the lower slightly outer left breast.

[R CC]
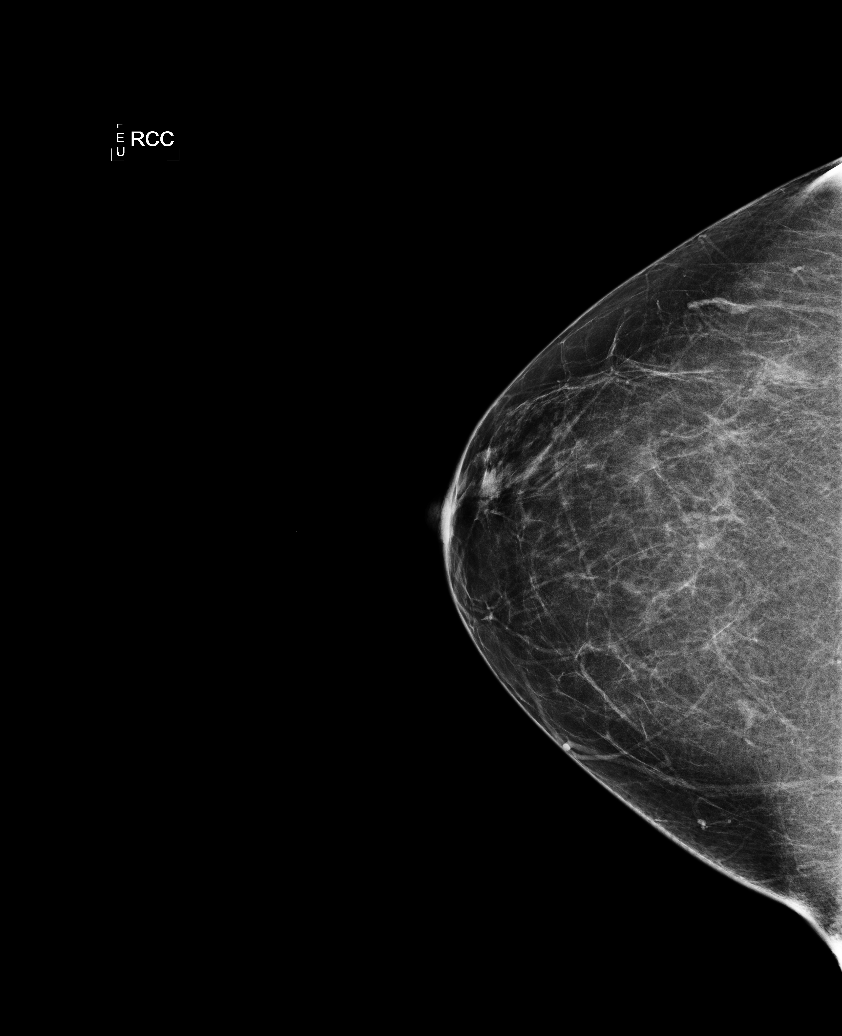

[L CC]
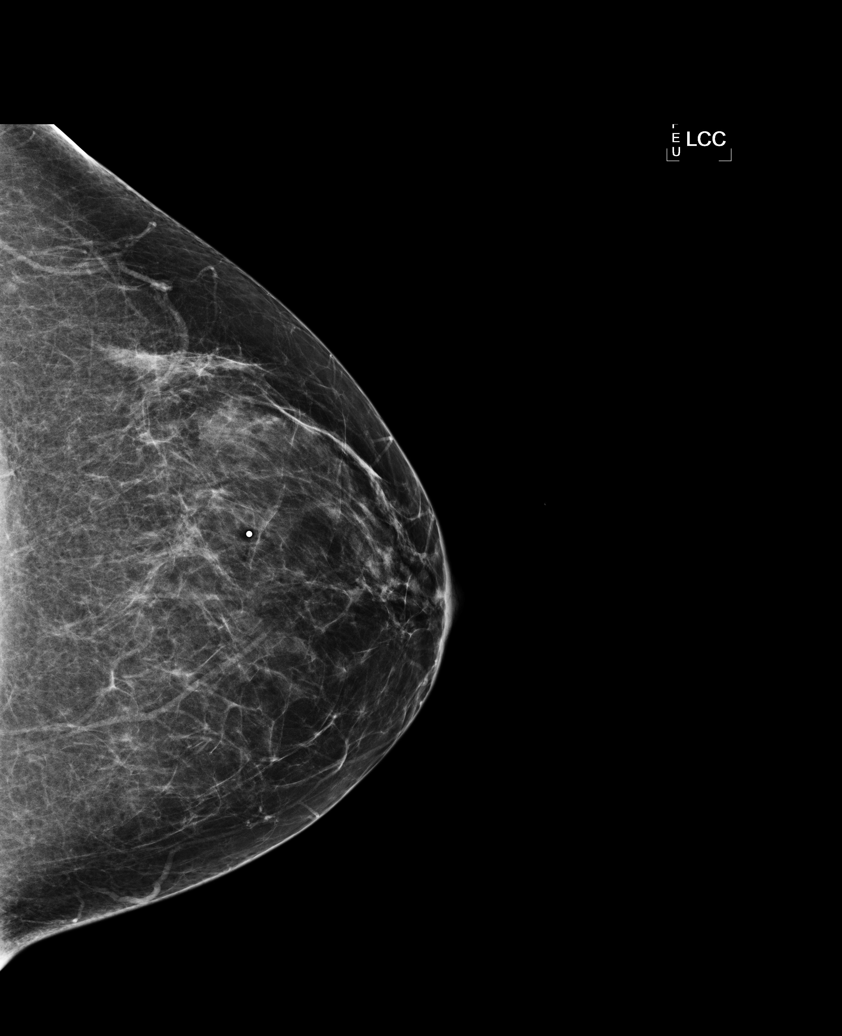

[L MLO]
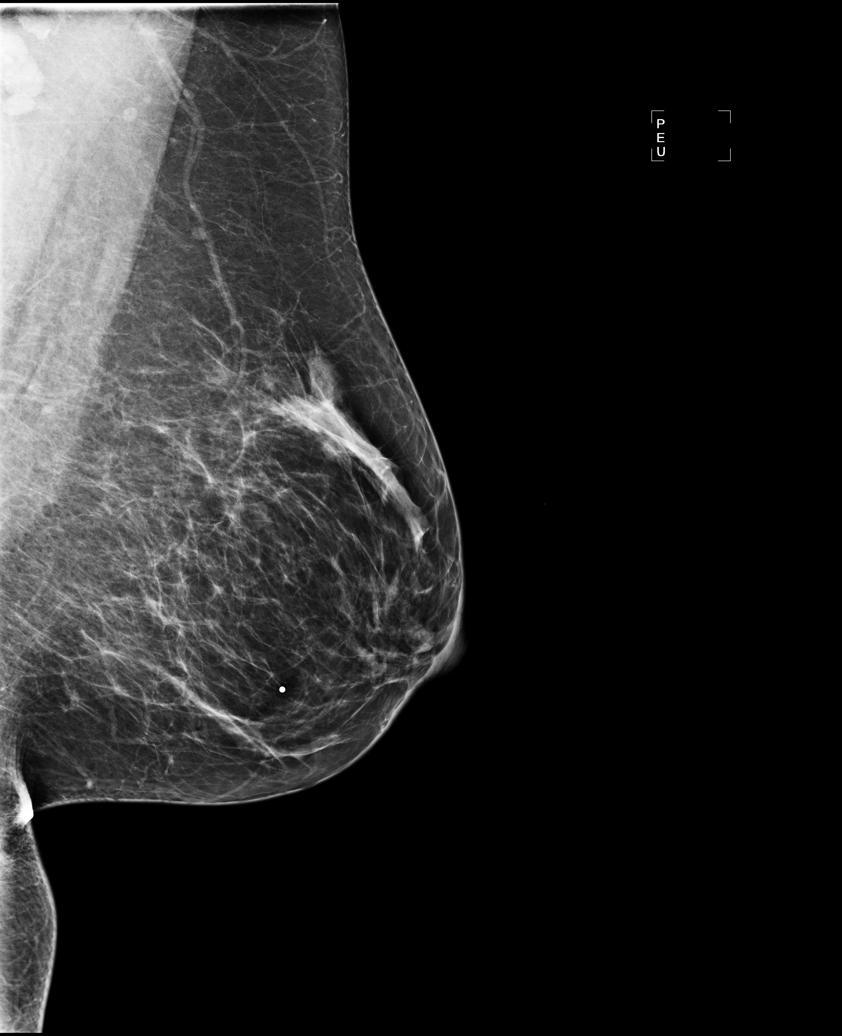

[R MLO]
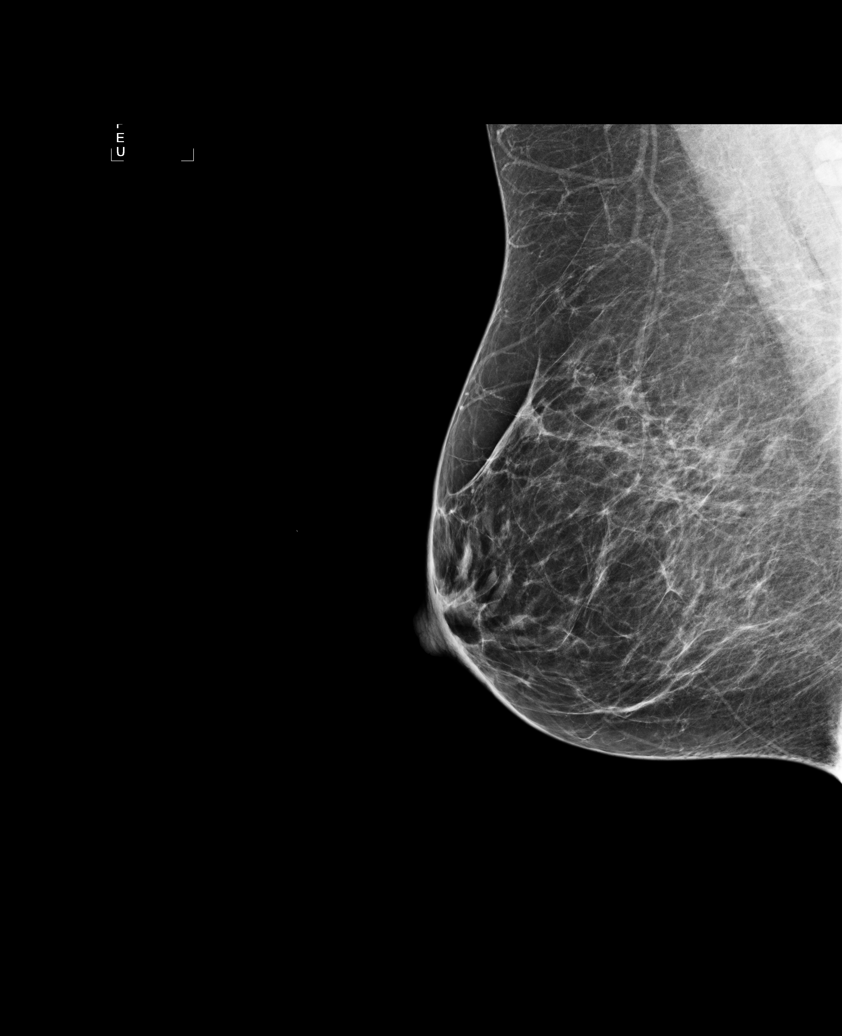

[L TAN]
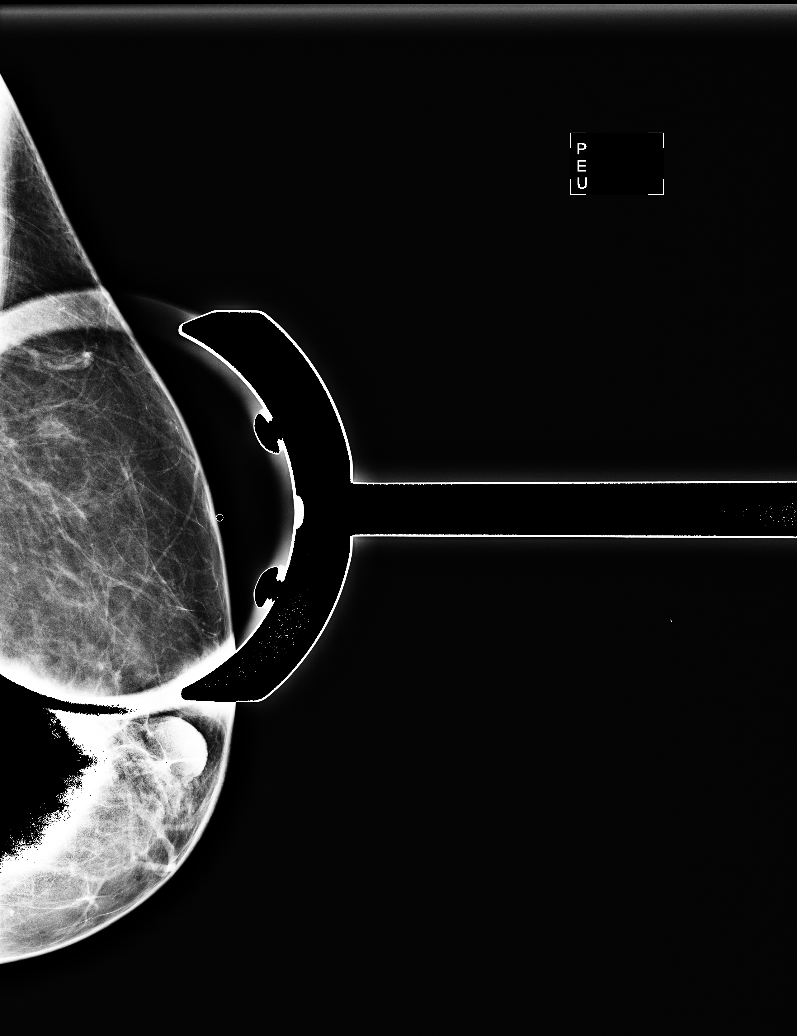

[5 of 5 positions shown; findings below may reference images not displayed]

Examination demonstrates scattered fibroglandular density.  There is no focal abnormality seen in 
the outer lower left breast to correspond to the palpable abnormality. The remainder of the 
mammogram is unchanged from the prior study.

An ultrasound was performed over the patient's palpable abnormality at 5 o'clock position of the 
left breast demonstrating no evidence of focal solid mass or cyst.
IMPRESSION: No mammographic or sonographic evidence of an abnormality to correspond to patient's palpable 
lesion in the outer lower left breast.

Recommend beginning annual screening mammography at age 39.  Would also recommend continued 
management of patient's palpable abnormality on a clinical basis.  Note that a negative mammogram 
and sonogram does not preclude surgical intervention of a clinically palpable abnormality.

ASSESSMENT: Negative - BI-RADS 1

Screening mammogram of both breasts at age 39.
,

## 2007-11-02 IMAGING — US UNKNOWN US STUDY
1 series · 2 of 2 positions shown · non-contrast
Comparison: Patient's prior exam from [DATE] is used for correlation.

DG DIAGNOSTIC BILATERAL
Bilateral CC and MLO view(s) were taken.

LEFT BREAST ULTRASOUND
Technologist: DIZ, Medical
DIGITAL BILATERAL DIAGNOSTIC MAMMOGRAM: AND LEFT BREAST ULTRASOUND:
CLINICAL DATA: Patient presents for diagnostic bilateral mammogram due to a new palpable 
abnormality over the lower slightly outer left breast.

[Series 1: unknown us study · 2 of 2 slices shown]
[im 1/2]
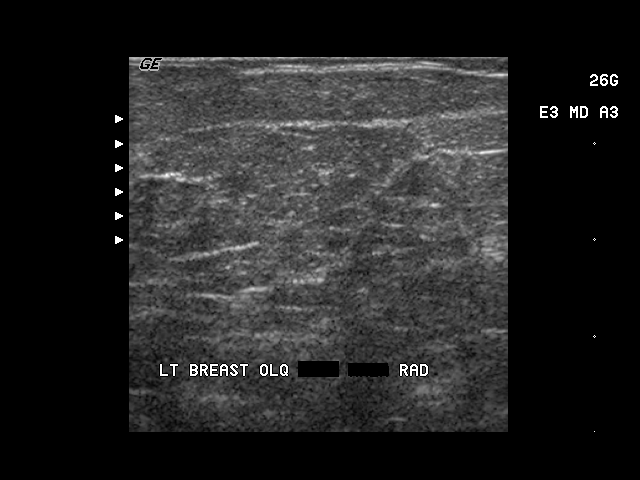
[im 2/2]
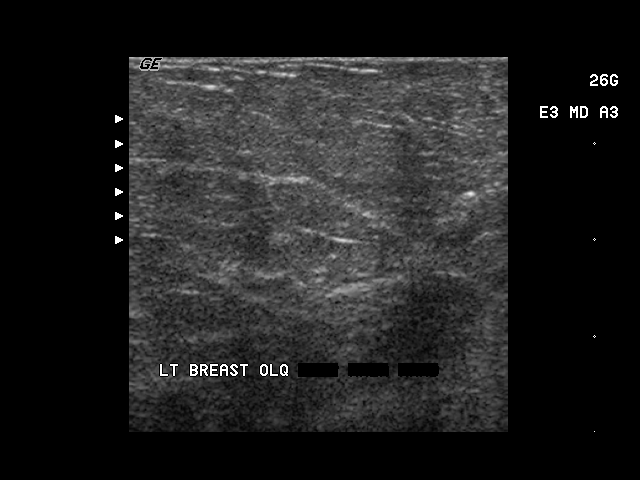

[2 of 2 positions shown; findings below may reference images not displayed]

Examination demonstrates scattered fibroglandular density.  There is no focal abnormality seen in 
the outer lower left breast to correspond to the palpable abnormality. The remainder of the 
mammogram is unchanged from the prior study.

An ultrasound was performed over the patient's palpable abnormality at 5 o'clock position of the 
left breast demonstrating no evidence of focal solid mass or cyst.
IMPRESSION: No mammographic or sonographic evidence of an abnormality to correspond to patient's palpable 
lesion in the outer lower left breast.

Recommend beginning annual screening mammography at age 39.  Would also recommend continued 
management of patient's palpable abnormality on a clinical basis.  Note that a negative mammogram 
and sonogram does not preclude surgical intervention of a clinically palpable abnormality.

ASSESSMENT: Negative - BI-RADS 1

Screening mammogram of both breasts at age 39.
,

## 2008-03-15 ENCOUNTER — Ambulatory Visit: Payer: Self-pay | Admitting: Internal Medicine

## 2008-03-15 DIAGNOSIS — E669 Obesity, unspecified: Secondary | ICD-10-CM | POA: Insufficient documentation

## 2008-03-15 LAB — CONVERTED CEMR LAB
BUN: 10 mg/dL (ref 6–23)
CO2: 27 meq/L (ref 19–32)
Calcium: 9.4 mg/dL (ref 8.4–10.5)
Chloride: 111 meq/L (ref 96–112)
Creatinine, Ser: 0.8 mg/dL (ref 0.4–1.2)
GFR calc Af Amer: 103 mL/min
GFR calc non Af Amer: 85 mL/min
Glucose, Bld: 90 mg/dL (ref 70–99)
Potassium: 4.9 meq/L (ref 3.5–5.1)
Sodium: 140 meq/L (ref 135–145)

## 2008-03-26 ENCOUNTER — Telehealth: Payer: Self-pay | Admitting: Internal Medicine

## 2008-06-04 ENCOUNTER — Telehealth: Payer: Self-pay | Admitting: Internal Medicine

## 2008-09-13 ENCOUNTER — Telehealth: Payer: Self-pay | Admitting: Internal Medicine

## 2009-01-27 ENCOUNTER — Encounter: Payer: Self-pay | Admitting: Internal Medicine

## 2010-12-22 NOTE — Assessment & Plan Note (Signed)
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  She has been seen by  the vein clinic.  She used warm compressive stockings for the past 3-4  months.  She continues to have leg pain and edema.  She has really had  no relief with the stockings.  She needs further evaluation and likely  intervention from the vein clinic.  I have spent 15 minutes with Ms.  Hursey discussing this.  She will follow up at the vein clinic.     Bruce Rexene Edison Swords, MD  Electronically Signed    BHS/MedQ  DD: 02/16/2007  DT: 02/16/2007  Job #: [id number]   cc:   Fayne Norrie, M.D.

## 2010-12-25 NOTE — Op Note (Signed)
NAME:  Holly Horn, Holly Horn                         ACCOUNT NO.:  [id number]   MEDICAL RECORD NO.:  [id number]                   PATIENT TYPE:  AMB   LOCATION:  [facility]                                  FACILITY:  [facility]   PHYSICIAN:  Huel Cote, NP                DATE OF BIRTH:  02-Jan-1971   DATE OF PROCEDURE:  03/25/2002  DATE OF DISCHARGE:                                 OPERATIVE REPORT   PREOPERATIVE DIAGNOSES:  1. Right ectopic pregnancy.  2. Previous ectopic pregnancy and surgical removal.   POSTOPERATIVE DIAGNOSES:  1. Right ectopic pregnancy.  2. Previous ectopic pregnancy and surgical removal.   PROCEDURE:  Laparoscopic right salpingostomy and removal of ectopic  pregnancy with lysis of adhesions.   SURGEON:  Huel Cote, M.D.   ANESTHESIA:  General.   ESTIMATED BLOOD LOSS:  50 cc.   FLUIDS:  Urine output 150 cc.  IV fluids 1500 cc.   FINDINGS:  There is a right ectopic pregnancy with some scarring of the  fallopian tube noted as it double backed upon itself.  However, the  fimbriated end appeared normal.  The left tube was markedly abnormal with a  clubbed end and no visible fimbria noted.  Ovaries and uterus were normal.  The pelvis appeared to have old adhesions from  possible TID.  There was no  significant endometriosis except for an occasional focus on the right  fallopian tube.   PATHOLOGY:  Tissue from the right tube was sent.   PROCEDURE:  The patient was taken to the operating room where general  anesthesia was obtained without difficulty.  She was then prepped and draped  in the normal sterile fashion in the dorsal lithotomy position.  A speculum  was placed within the vagina and cervix identified with a Hulka tenaculum  placed within it for uterine manipulation.  A Foley catheter was also  placed.  Attention was then turned to the patient's abdomen where a small  infraumbilical incision was made with a scalpel and after injection with  0.25%  Marcaine, the Veress needle was then introduced into this incision and  pneumoperitoneum obtained with approximately 2.5 liters of CO2 gas.  The  Veress needle removed.  The trocar was then placed within the incision, 1011  in size, and intraperitoneal placement confirmed with the camera.  With the  gas flow thus applied, one additional 5-mm trocar was placed in the left  lower quadrant under good direct visualization and no active bleeding noted.  An atraumatic grasper was utilized to grasp the right fallopian tube towards  the distal end and with the ectopic bulge thus exposed, it was opened with  the [facility] unit in a linear fashion with a small amount of clot and  products of conception removed through the opening and handed off to  pathology.  Hydrodissection was then extensively performed with no further  tissue noted in the  tube and the edges of the tube lightly cauterized with  the [facility] cautery unit to minimize bleeding.  The tube was carefully  inspected and although it had some adhesions which tethered it to the right  pelvic wall, its fimbriated end was essentially normal and not in close  proximity to the ovary itself.  The left fallopian tube was carefully  inspected and was markedly abnormal with possible old distal hydrosalpinx  and a clubbed end with no fimbria noted.  This was also adherent to the  pelvic side wall and was taken down with the cutting cautery unit.  There  was some light adhesions of the left colon to the side wall which was taken  down sharply.  At the conclusion of the procedure, there was no active  bleeding noted.  Both tubes were relatively free although the left was  markedly abnormal, as previously described and the ovaries appeared normal.  The appendix and liver edge also appeared normal.  Therefore, the 5-mm  trocar was removed under direct visualization with no bleeding noted.  Additionally, the 1011 trocar was removed with no problem and the   pneumoperitoneum completely evacuated prior to its removal.  The umbilical  incision was closed with 0 Vicryl in one figure-of-eight suture in a deep  stitch and then 3-0 Vicryl in a subcuticular stitch.  The 5-mm port was  closed with one subcuticular mattress suture of 3-0 Vicryl.  Sponge, lap,  and needle counts were correct x2 and the Hulka tenaculum was removed from  the vagina prior to awakening the patient.  She was awakened and taken to  the recovery room in stable condition.                                                Huel Cote, NP    KR/MEDQ  D:  03/25/2002  T:  03/25/2002  Job:  [id number]

## 2010-12-25 NOTE — H&P (Signed)
NAME:  Holly Horn, Holly Horn               ACCOUNT NO.:  [id number]   MEDICAL RECORD NO.:  [id number]          PATIENT TYPE:  AMB   LOCATION:  [facility]                           FACILITY:  [facility]   PHYSICIAN:  Zenaida Niece, M.D.DATE OF BIRTH:  15-Aug-1970   DATE OF ADMISSION:  04/13/2005  DATE OF DISCHARGE:                                HISTORY & PHYSICAL   CHIEF COMPLAINT:  Pelvic pain and left ovarian cyst/pelvic mass.   HISTORY OF PRESENT ILLNESS:  This is a 40 year old white female gravida 4,  para 0-0-4-0 whom I saw for an annual exam in December 2005.  At that time  she was having regular menses with significant cramping which Vicodin  helped.  She was also taking over-the-counter nonsteroidals and this was  helping also.  She has had a previous laparoscopy with right salpingectomy  for an ectopic pregnancy and has a known distal occlusion of the left tube  by that same laparoscopy.  At that time her exam was normal with an upper  limits of normal size uterus and we discussed tuboplasty.  I then next saw  her on March 03, 2005.  At that point she had been having pain for  approximately one week.  She had a CT scan performed on March 02, 2005 which  revealed a left pelvic mass.  Ultrasound on March 03, 2005 revealed a complex  left adnexal mass although her pain is more on the right than the left.  Exam at that time revealed bilateral lower quadrant tenderness on the right  greater than the left without rebound and no specific mass.  On pelvic exam  she was slightly tender on the left with a fullness.  Due to her young age I  discussed with her that this is most likely a benign process and that  surgery was not necessary.  However, after talking with several of her  friends and family members, the patient wishes to proceed with surgical  evaluation of this mass.  Although she does want to preserve chances of  fertility, she does want this mass removed.  She is admitted for this at  this  time.   PAST OBSTETRICAL HISTORY:  Past OB history is significant for one elective  termination and three ectopic pregnancies.   GYNECOLOGIC HISTORY:  History of cryotherapy with normal followup Pap  smears.   PAST MEDICAL HISTORY:  Negative.   PAST SURGICAL HISTORY:  Laparoscopy x3, most recently with a right  salpingectomy for ectopic pregnancy.   SOCIAL HISTORY:  She does smoke less than a pack of cigarettes a day.   ALLERGIES:  She is sensitive to MORPHINE.   CURRENT MEDICATIONS:  None.   FAMILY HISTORY:  Maternal aunt, maternal grandmother, and paternal  grandmother with breast cancer.   PHYSICAL EXAMINATION:  VITAL SIGNS:  Weight is approximately 200 pounds.  Last blood pressure in the office was 124/82.  GENERAL:  This is a slightly obese white female in no acute distress.  NECK:  Supple without lymphadenopathy or thyromegaly.  LUNGS:  Clear to auscultation.  HEART:  Regular rate  and rhythm without murmur.  ABDOMEN:  Soft, nondistended, without palpable masses.  Again, she is  slightly tender in bilateral lower quadrants without palpable masses and  without rebound.  EXTREMITIES:  No edema and are nontender.  GENITALIA:  On pelvic exam external genitalia has no lesions.  Speculum exam  has revealed a normal cervix.  On bimanual exam she is slightly tender on  the left with fullness.   ASSESSMENT:  Cystic and solid left adnexal mass by ultrasound.  She also has  a known history of left distal tubal occlusion.  All options including  observation have been discussed with the patient but she wishes to proceed  with surgical evaluation.  She understands that this may then require  removal of her left tube and ovary and remove any chances of spontaneous  pregnancy.  She does wish to maintain fertility if possible but does want  surgical evaluation.   PLAN:  Plan is to admit the patient on the day of surgery for a laparoscopy  with removal of this pelvic mass.  Again, we  will preserve fertility if at  all possible.  Risks of surgery including bleeding, infection, and damage to  the surrounding organs have been discussed with the patient.      Zenaida Niece, M.D.  Electronically Signed     TDM/MEDQ  D:  04/12/2005  T:  04/12/2005  Job:  [id number]

## 2010-12-25 NOTE — Op Note (Signed)
NAME:  Holly Horn, Holly Horn               ACCOUNT NO.:  [id number]   MEDICAL RECORD NO.:  [id number]          PATIENT TYPE:  AMB   LOCATION:  [facility]                           FACILITY:  [facility]   PHYSICIAN:  Zenaida Niece, M.D.DATE OF BIRTH:  04-Aug-1971   DATE OF PROCEDURE:  04/13/2005  DATE OF DISCHARGE:                                 OPERATIVE REPORT   PREOPERATIVE DIAGNOSIS:  Left pelvic mass.   POSTOPERATIVE DIAGNOSIS:  Distal left hydrosalpinx.   PROCEDURES:  Laparoscopy followed by a minilaparotomy with left tuboplasty.   SURGEON:  Zenaida Niece, M.D.   ASSISTANT:  Huel Cote, M.D.   ANESTHESIA:  General endotracheal tube.   SPECIMENS:  None.   ESTIMATED BLOOD LOSS:  Less than 50 cc.   COMPLICATIONS:  None.   FINDINGS:  Essentially normal pelvis with a normal uterus with an absent  right fallopian tube. She had normal ovaries with a small simple cyst on the  left ovary. She had a distal left hydrosalpinx.   PROCEDURE IN DETAIL:  The patient was taken to the operating room and placed  in the dorsal supine position. General anesthesia was induced and she was  placed in mobile stirrups. Abdomen was then prepped and draped in the usual  sterile fashion, bladder drained with a red rubber catheter, Hulka tenaculum  applied to the cervix for uterine manipulation. Infraumbilical skin was then  infiltrated with 0.25% Marcaine and a 1-cm horizontal incision was made. The  Veress needle was inserted into the peritoneal cavity and placement  confirmed by the water drop test and opening pressure 7 mmHg. CO2 gas was  insufflated to a pressure of 14 mmHg and the Veress needle was removed. A 5-  mm Ethicon XL trocar was then used to enter the peritoneal cavity with  direct visualization. A 5-mm port was then placed on the left side under  direct visualization. Inspection revealed the above-mentioned findings.  Uterus was normal with a small stump of right fallopian tube. The  right  ovary appeared normal. The left ovary was normal with a significantly  dilated and clubbed distal left tube with hydrosalpinx. This appeared to be  the only pathology. The laparoscope was then removed. I decided to proceed  with a minilaparotomy. Abdomen was entered via a standard Pfannenstiel's  incision approximately 6 cm in width. Bowels were packed away from the  uterus and the left fallopian tube was identified and brought to the  incision. The distal portion of the tube was grasped with Babcock clamps and  an X-shaped incision was made in the end of the tube. Bloody clear fluid was  drained from this area. Hemostasis was achieved with electrocautery from the  edges of the incision. The four flaps were then sewn back upon the tube with  interrupted sutures of 4-0 Vicryl. This was done to open up the end of the  tube. The tubal ostia was identified and appeared to be patent. There was no  other significant bleeding. The lap sponge was removed from the abdomen.  Subfascial space was inspected and found to be hemostatic.  Fascia was closed  in running fashion starting at both ends and meeting in the middle with 0  Vicryl. Subcutaneous tissue was then irrigated and made hemostatic with  electrocautery. Skin was then closed with staples and a sterile dressing.  Laparoscopic incisions were closed with Dermabond after the 5 mm ports were  removed. The patient tolerated the procedure well and was taken to recovery  in stable condition. Counts were correct x2 and she received no antibiotics.  She did have PAS hose on throughout the procedure. The initial plan is to  discharge her once she has recovered but she may require an overnight stay.      Zenaida Niece, M.D.  Electronically Signed     TDM/MEDQ  D:  04/13/2005  T:  04/13/2005  Job:  [id number]

## 2010-12-25 NOTE — Op Note (Signed)
NAME:  Holly Horn, Holly Horn                         ACCOUNT NO.:  [id number]   MEDICAL RECORD NO.:  [id number]                   PATIENT TYPE:  AMB   LOCATION:  SDC                                  FACILITY:  [facility]   PHYSICIAN:  Zenaida Niece, M.D.             DATE OF BIRTH:  [birth year]   DATE OF PROCEDURE:  04/21/2003  DATE OF DISCHARGE:                                 OPERATIVE REPORT   PREOPERATIVE DIAGNOSIS:  Right ectopic pregnancy.   POSTOPERATIVE DIAGNOSIS:  Right ectopic pregnancy.   PROCEDURE:  Laparoscopy with right salpingectomy.   SURGEON:  Zenaida Niece, M.D.   ANESTHESIA:  General endotracheal tube.   ESTIMATED BLOOD LOSS:  Less than 50 mL.   FINDINGS:  She had an obvious rupturing right ectopic pregnancy.  There was  a small amount of blood in the posterior cul-de-sac.  There was distal  clubbing of the left fallopian tube.  She appeared to have normal ovaries  and a small posterior leiomyoma.   DESCRIPTION OF PROCEDURE:  The patient was taken to the operating room and  placed in the dorsal supine position.  General anesthesia was induced, and  she was placed in mobile stirrups.  Her left arm was tucked by her side.  Abdomen was prepped and draped in the usual sterile fashion.  Bladder  drained with a red rubber catheter, and a Hulka tenaculum applied to her  cervix for uterine manipulation.  Infraumbilical skin was then infiltrated  with 0.25% Marcaine and a 3 cm horizontal incision was made.  Using  retractors, the fascia was identified, elevated and entered sharply.  The  peritoneal cavity was then entered bluntly, and I was not able to palpate  any adhesions.  A pursestring suture of 0 Vicryl was placed around the  fascia, and the Hasson cannula was inserted.  The operating laparoscope was  inserted.  Placement was confirmed via the scope.  The abdomen was  insufflated with CO2 gas.  A 5 mm port was then placed in the midline above  the pubic symphysis  under direct visualization at one of her previous scars.  Inspection revealed the above mentioned findings with an obvious rupturing  right pregnancy.  The right fallopian tube was grasped and using bipolar  cautery the tube was separated from the proximal tube as well as from the  mesosalpinx and infundibulopelvic ligament.  The fimbria was slightly  adherent to the ovary, and this was taken down with electrocautery and with  sharp dissection.  Using scissors, the tube was freed from all of these  areas that had been coagulated.  All areas were then recoagulated with  bipolar cautery to ensure hemostasis.  The remainder of the pelvis appeared  hemostatic.  Again, left tube appears to have a distal occlusion.  The tube  was then grasped through the scope and brought through the umbilical  incision. All gas was  allowed to deflate from the abdomen, and the 5 mm  trocar was removed.  The pursestring suture was tied and appeared to achieve  good approximation of the fascia.  The skin incisions were closed with  interrupted subcuticular sutures of 4-0 Vicryl  followed by Steri-Strips and Band-Aids.  The Hulka tenaculum was removed.  The patient was extubated in the operating room and taken to the recovery  room after tolerating the procedure well.  Counts were correct, and she  received no antibiotics.                                               Zenaida Niece, M.D.    TDM/MEDQ  D:  04/21/2003  T:  04/21/2003  Job:  [id number]

## 2012-08-07 ENCOUNTER — Other Ambulatory Visit (HOSPITAL_COMMUNITY): Payer: Self-pay | Admitting: Obstetrics and Gynecology

## 2012-08-07 DIAGNOSIS — Z1231 Encounter for screening mammogram for malignant neoplasm of breast: Secondary | ICD-10-CM

## 2012-08-17 ENCOUNTER — Ambulatory Visit (HOSPITAL_COMMUNITY): Payer: Self-pay

## 2012-08-28 ENCOUNTER — Other Ambulatory Visit (HOSPITAL_COMMUNITY): Payer: Self-pay | Admitting: Obstetrics and Gynecology

## 2012-08-28 DIAGNOSIS — Z1231 Encounter for screening mammogram for malignant neoplasm of breast: Secondary | ICD-10-CM

## 2012-09-05 ENCOUNTER — Ambulatory Visit (HOSPITAL_COMMUNITY): Admission: RE | Admit: 2012-09-05 | Payer: Self-pay | Source: Ambulatory Visit

## 2012-09-26 ENCOUNTER — Ambulatory Visit (HOSPITAL_COMMUNITY)
Admission: RE | Admit: 2012-09-26 | Discharge: 2012-09-26 | Disposition: A | Discharge status: Home / Self Care | Payer: BC Managed Care – PPO | Source: Ambulatory Visit | Attending: Obstetrics and Gynecology | Admitting: Obstetrics and Gynecology

## 2012-09-26 DIAGNOSIS — Z1231 Encounter for screening mammogram for malignant neoplasm of breast: Secondary | ICD-10-CM | POA: Insufficient documentation

## 2012-09-26 IMAGING — MG MM DIGITAL SCREENING BILAT
4 series · 4 of 4 positions shown · non-contrast
Comparison: [DATE]

CLINICAL DATA: Screening.

DIGITAL BILATERAL SCREENING MAMMOGRAM WITH CAD

[R CC]
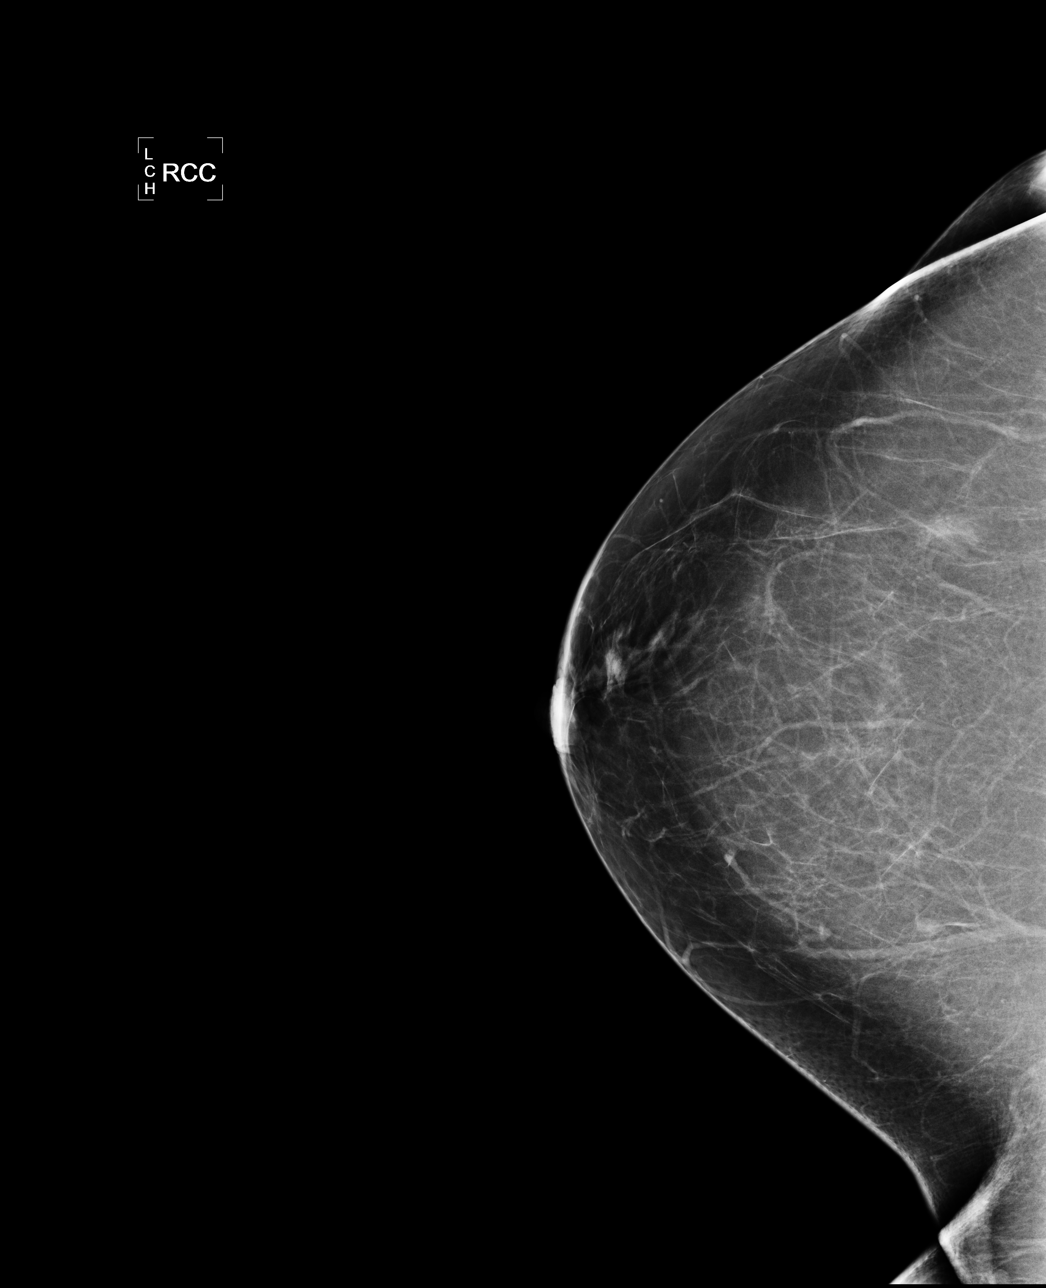

[R MLO]
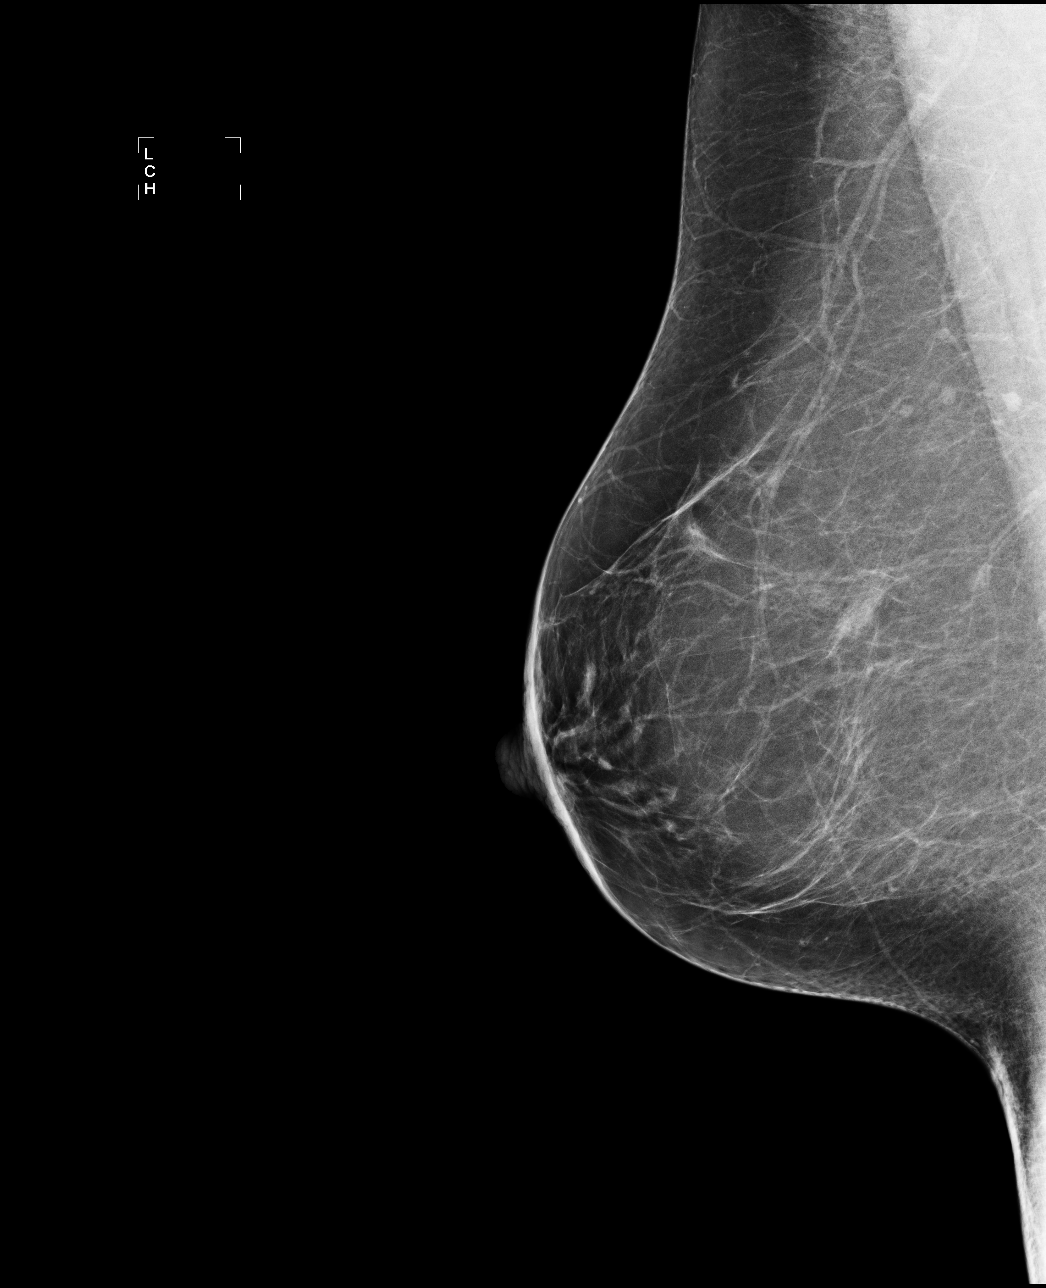

[L CC]
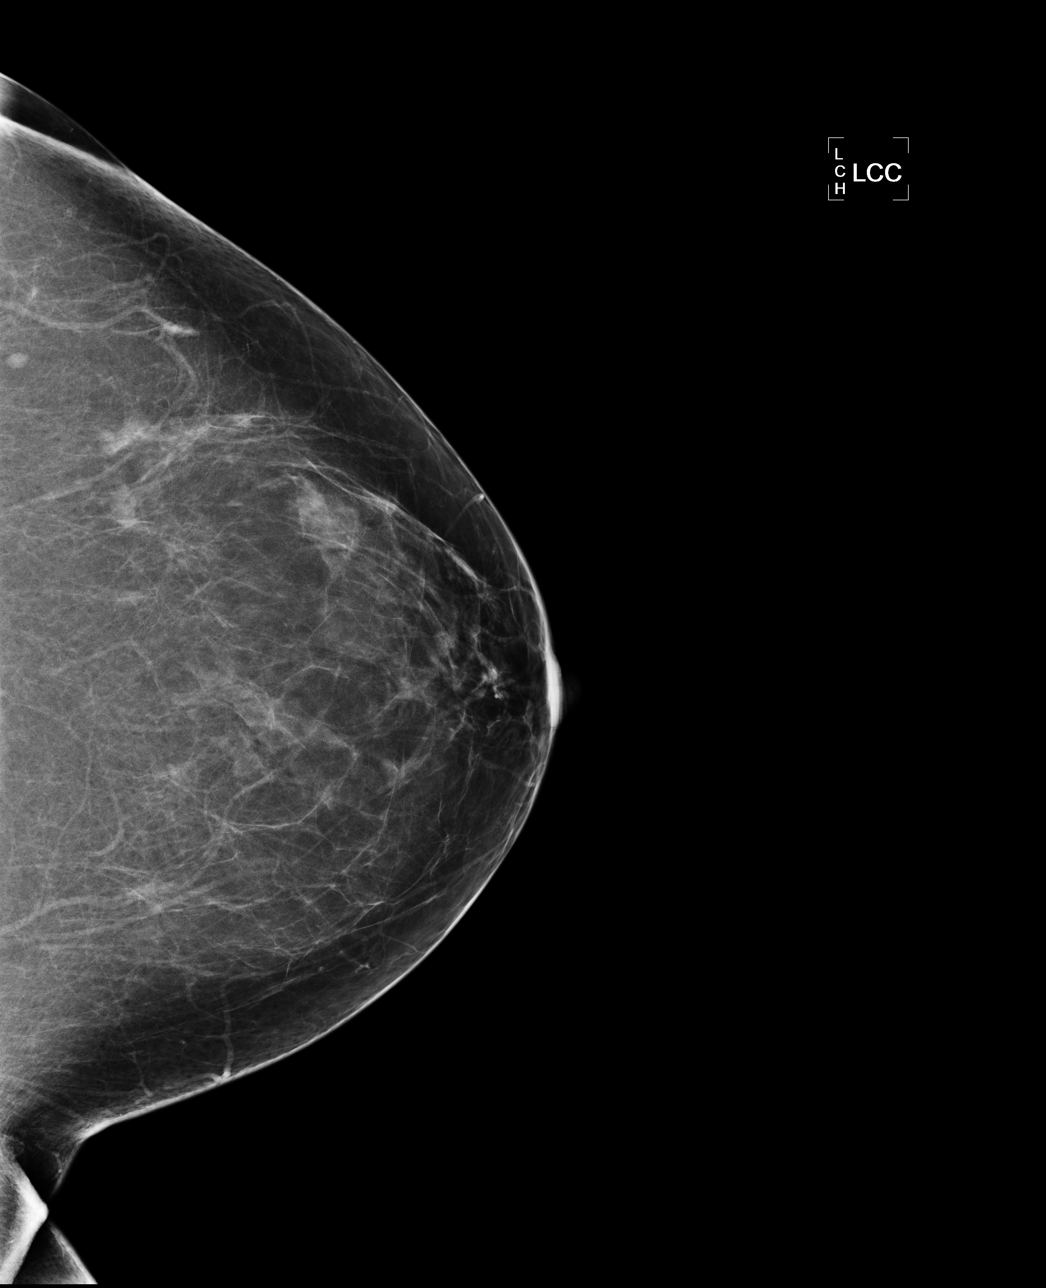

[L MLO]
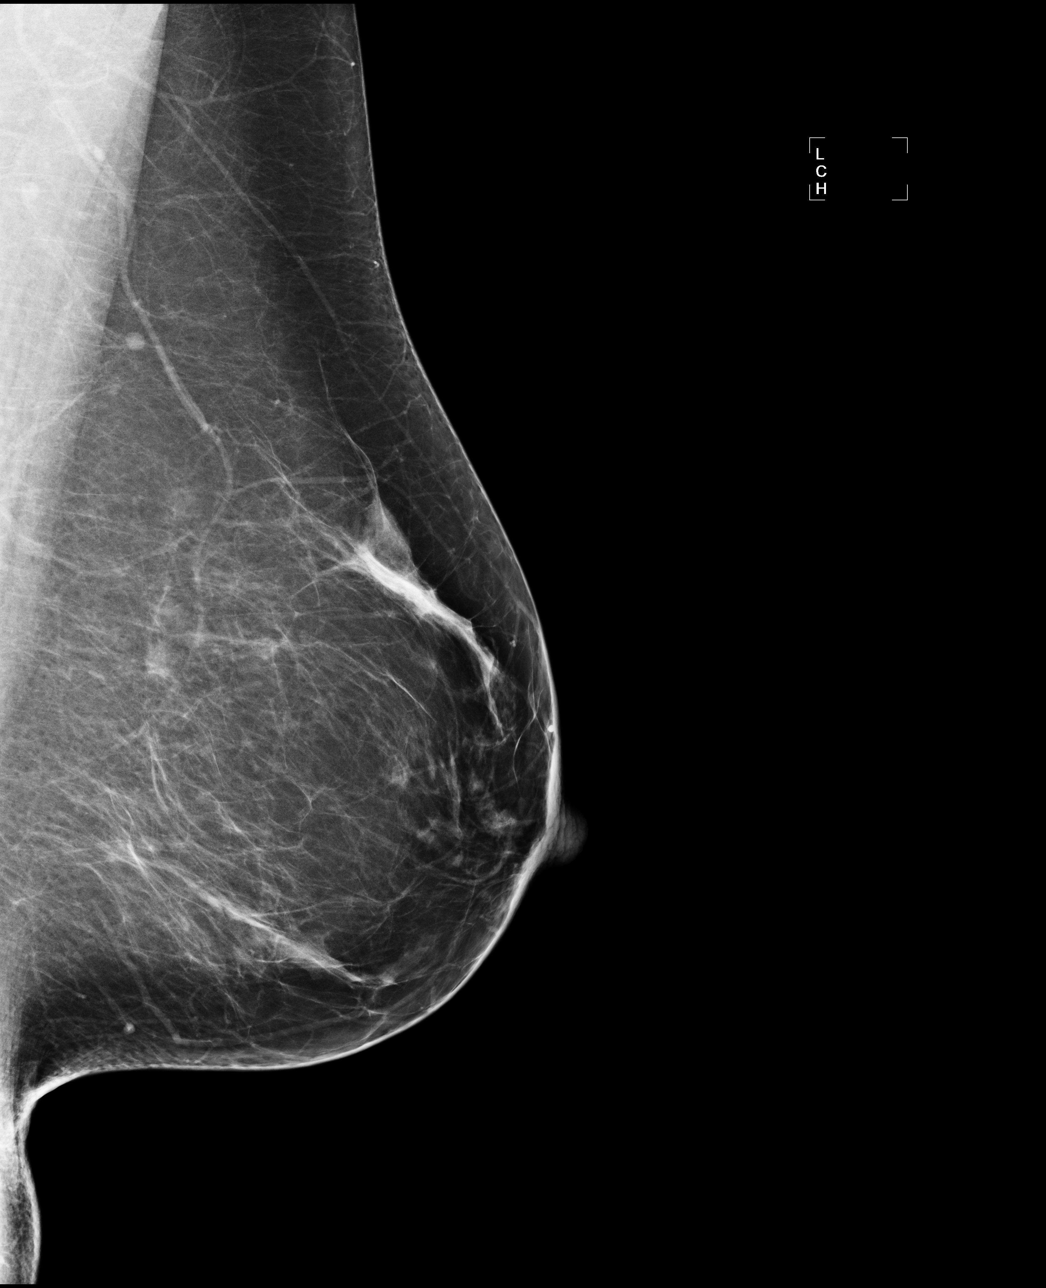

[4 of 4 positions shown; findings below may reference images not displayed]

FINDINGS: ACR Breast Density Category 2: There are scattered fibroglandular
densities.

There is no suspicious dominant mass, architectural distortion, or
calcification to suggest malignancy.

Images were processed with CAD.
IMPRESSION: No mammographic evidence of malignancy.

A result letter of this screening mammogram will be mailed directly
to the patient.

RECOMMENDATION:
Screening mammogram in one year. (Code:[K7])

BI-RADS CATEGORY 1:  Negative.

## 2013-04-23 ENCOUNTER — Emergency Department (HOSPITAL_BASED_OUTPATIENT_CLINIC_OR_DEPARTMENT_OTHER): Payer: BC Managed Care – PPO

## 2013-04-23 ENCOUNTER — Emergency Department (HOSPITAL_BASED_OUTPATIENT_CLINIC_OR_DEPARTMENT_OTHER)
Admission: EM | Admit: 2013-04-23 | Discharge: 2013-04-23 | Disposition: A | Discharge status: Home / Self Care | Payer: BC Managed Care – PPO | Attending: Emergency Medicine | Admitting: Emergency Medicine

## 2013-04-23 DIAGNOSIS — Z8742 Personal history of other diseases of the female genital tract: Secondary | ICD-10-CM | POA: Insufficient documentation

## 2013-04-23 DIAGNOSIS — D259 Leiomyoma of uterus, unspecified: Secondary | ICD-10-CM | POA: Insufficient documentation

## 2013-04-23 DIAGNOSIS — Z3202 Encounter for pregnancy test, result negative: Secondary | ICD-10-CM | POA: Insufficient documentation

## 2013-04-23 DIAGNOSIS — R110 Nausea: Secondary | ICD-10-CM | POA: Insufficient documentation

## 2013-04-23 DIAGNOSIS — D219 Benign neoplasm of connective and other soft tissue, unspecified: Secondary | ICD-10-CM

## 2013-04-23 LAB — CBC WITH DIFFERENTIAL/PLATELET
Basophils Absolute: 0 10*3/uL (ref 0.0–0.1)
Basophils Relative: 0 % (ref 0–1)
Eosinophils Absolute: 0.1 10*3/uL (ref 0.0–0.7)
Eosinophils Relative: 0 % (ref 0–5)
HCT: 37.5 % (ref 36.0–46.0)
Hemoglobin: 12.7 g/dL (ref 12.0–15.0)
Lymphocytes Relative: 19 % (ref 12–46)
Lymphs Abs: 2.4 10*3/uL (ref 0.7–4.0)
MCH: 31.1 pg (ref 26.0–34.0)
MCHC: 33.9 g/dL (ref 30.0–36.0)
MCV: 91.7 fL (ref 78.0–100.0)
Monocytes Absolute: 0.7 10*3/uL (ref 0.1–1.0)
Monocytes Relative: 6 % (ref 3–12)
Neutro Abs: 9.2 10*3/uL — ABNORMAL HIGH (ref 1.7–7.7)
Neutrophils Relative %: 75 % (ref 43–77)
Platelets: 303 10*3/uL (ref 150–400)
RBC: 4.09 MIL/uL (ref 3.87–5.11)
RDW: 12.5 % (ref 11.5–15.5)
WBC: 12.3 10*3/uL — ABNORMAL HIGH (ref 4.0–10.5)

## 2013-04-23 LAB — URINALYSIS, ROUTINE W REFLEX MICROSCOPIC
Bilirubin Urine: NEGATIVE
Glucose, UA: NEGATIVE mg/dL
Hgb urine dipstick: NEGATIVE
Ketones, ur: NEGATIVE mg/dL
Leukocytes, UA: NEGATIVE
Nitrite: NEGATIVE
Protein, ur: NEGATIVE mg/dL
Specific Gravity, Urine: 1.024 (ref 1.005–1.030)
Urobilinogen, UA: 0.2 mg/dL (ref 0.0–1.0)
pH: 6.5 (ref 5.0–8.0)

## 2013-04-23 LAB — PREGNANCY, URINE: Preg Test, Ur: NEGATIVE

## 2013-04-23 LAB — COMPREHENSIVE METABOLIC PANEL
ALT: 20 U/L (ref 0–35)
AST: 17 U/L (ref 0–37)
Albumin: 3.9 g/dL (ref 3.5–5.2)
Alkaline Phosphatase: 96 U/L (ref 39–117)
BUN: 13 mg/dL (ref 6–23)
CO2: 24 mEq/L (ref 19–32)
Calcium: 10 mg/dL (ref 8.4–10.5)
Chloride: 101 mEq/L (ref 96–112)
Creatinine, Ser: 0.7 mg/dL (ref 0.50–1.10)
GFR calc Af Amer: >90 mL/min (ref >90)
GFR calc non Af Amer: >90 mL/min (ref >90)
Glucose, Bld: 98 mg/dL (ref 70–99)
Potassium: 3.6 mEq/L (ref 3.5–5.1)
Sodium: 136 mEq/L (ref 135–145)
Total Bilirubin: 0.5 mg/dL (ref 0.3–1.2)
Total Protein: 6.8 g/dL (ref 6.0–8.3)

## 2013-04-23 LAB — WET PREP, GENITAL: Yeast Wet Prep HPF POC: NONE SEEN

## 2013-04-23 LAB — RPR: RPR Ser Ql: NONREACTIVE

## 2013-04-23 IMAGING — US US TRANSVAGINAL NON-OB
1 series · 14 of 25 positions shown · non-contrast
Comparison: [DATE] ultrasound, CT [DATE]

CLINICAL DATA: Right lower quadrant pelvic pain



[Series 1: us transvaginal non-ob · 0.32mm/px · 14 of 60 slices shown]
[im 1/60]
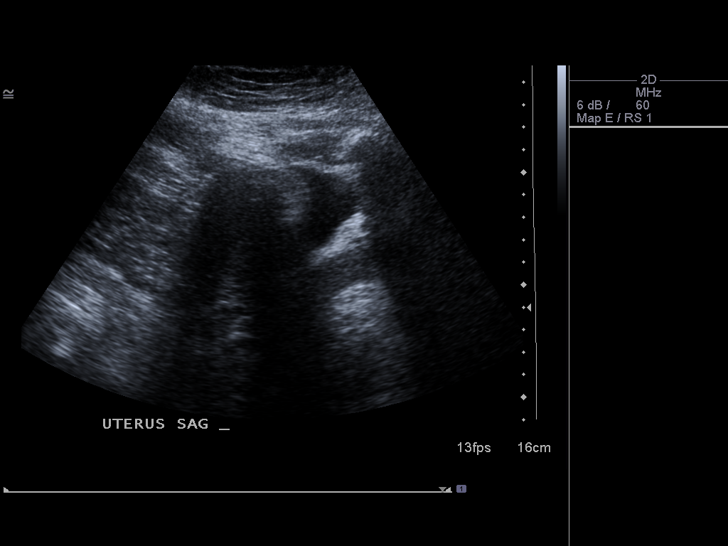
[im 5/60]
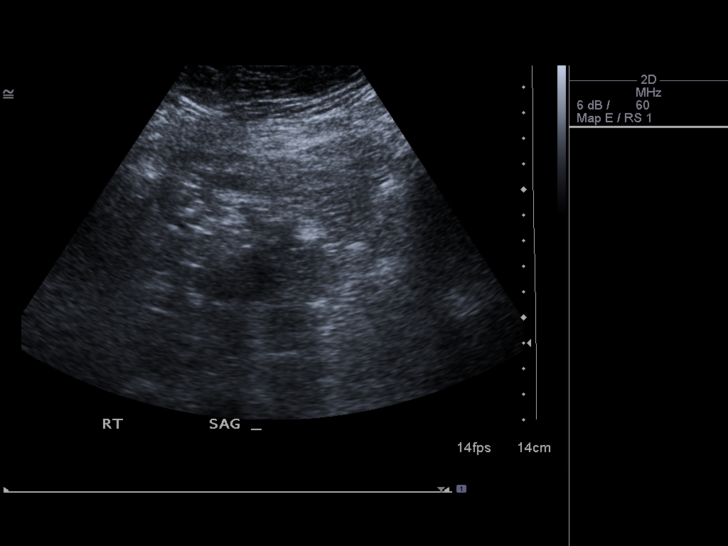
[im 10/60]
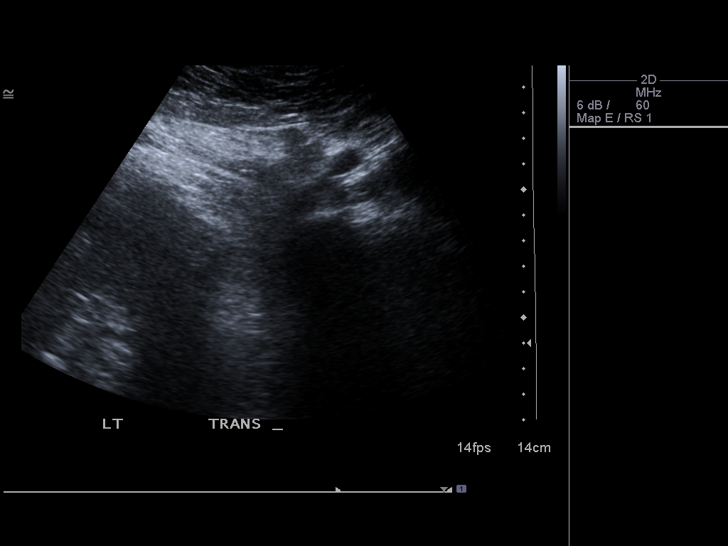
[im 15/60]
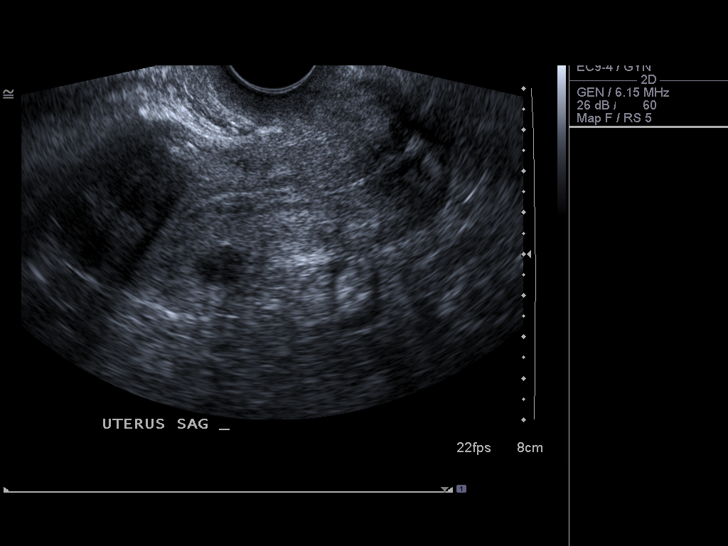
[im 20/60]
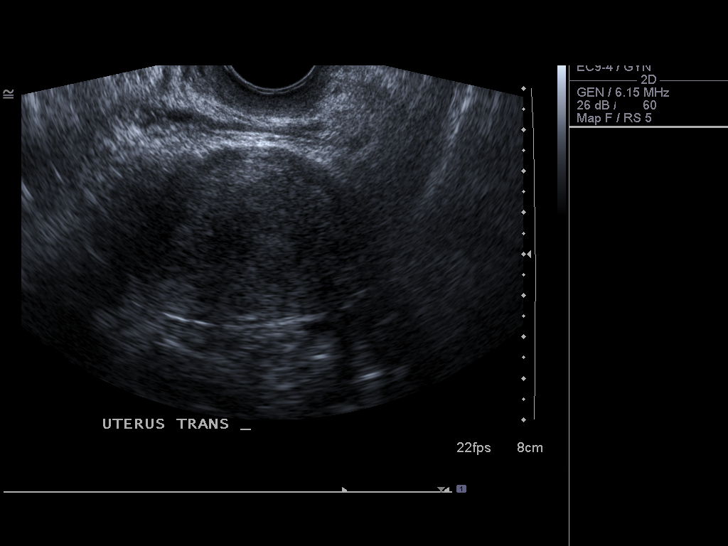
[im 23/60]
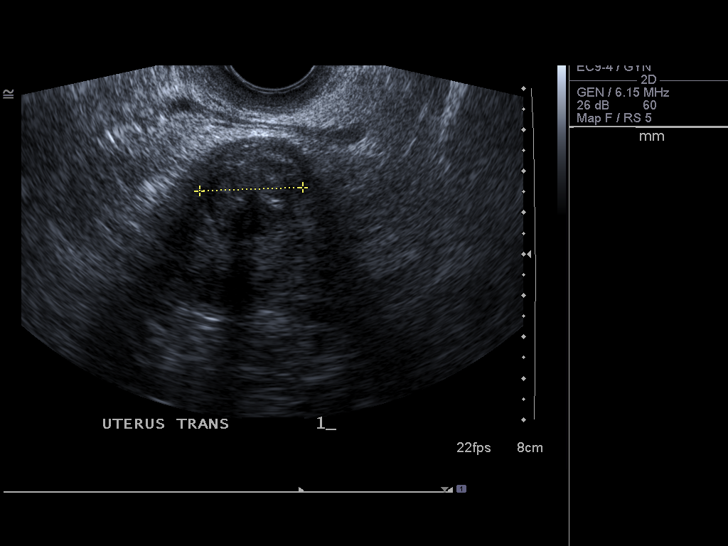
[im 28/60]
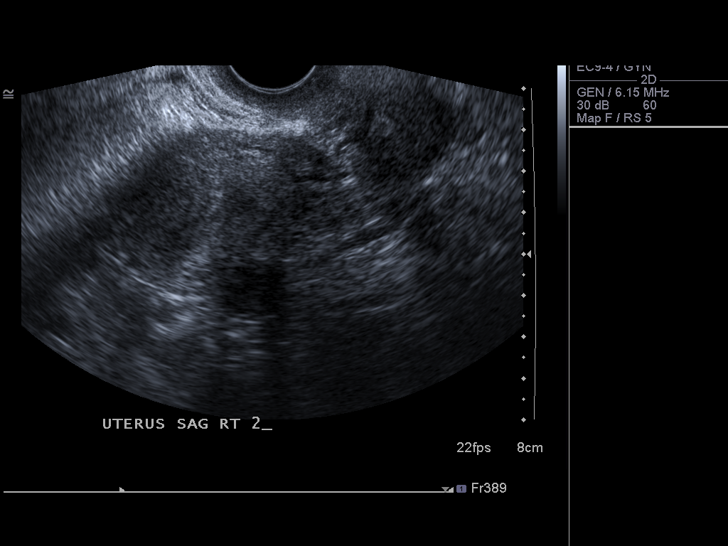
[im 32/60]
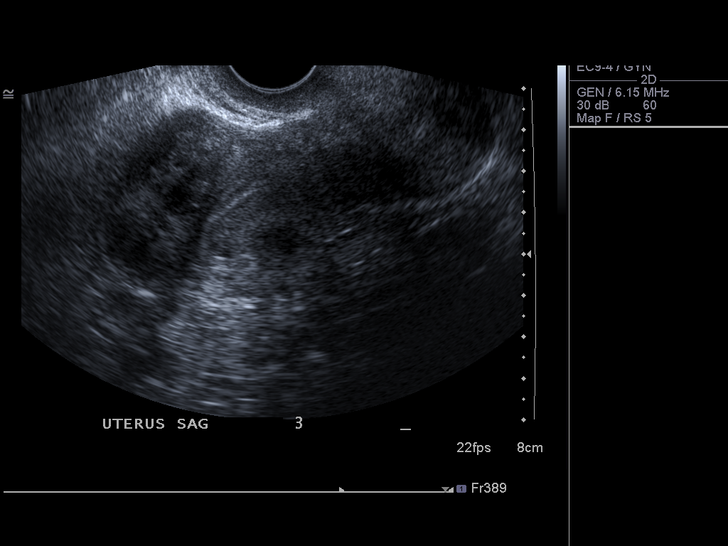
[im 37/60]
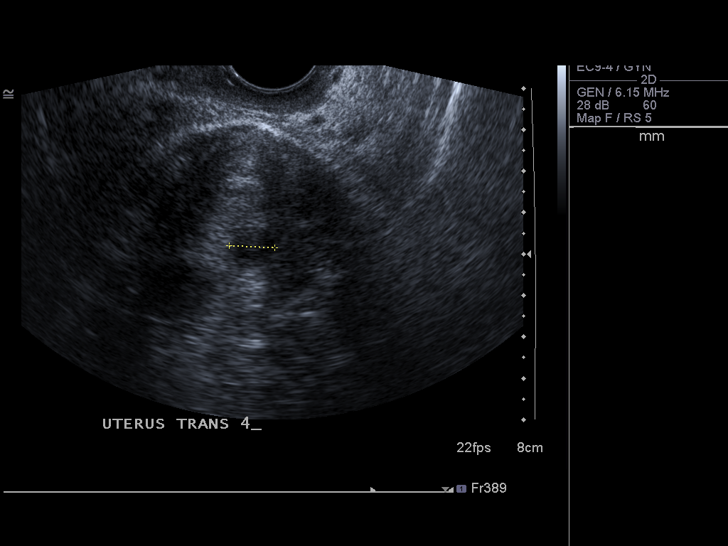
[im 40/60]
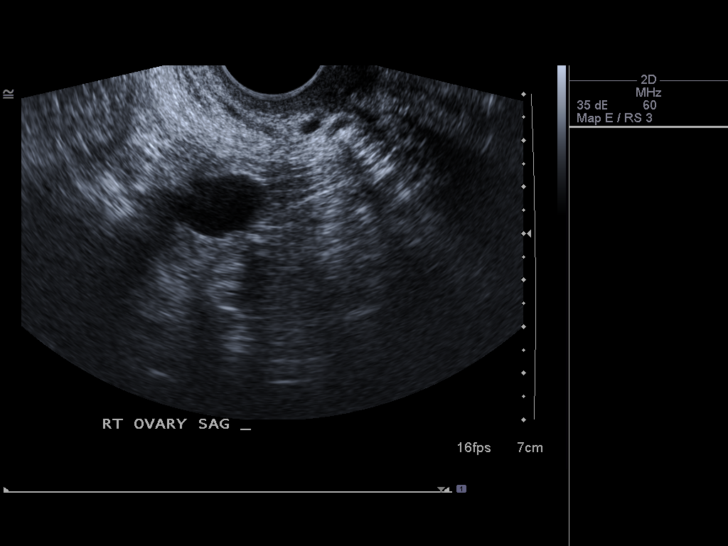
[im 45/60]
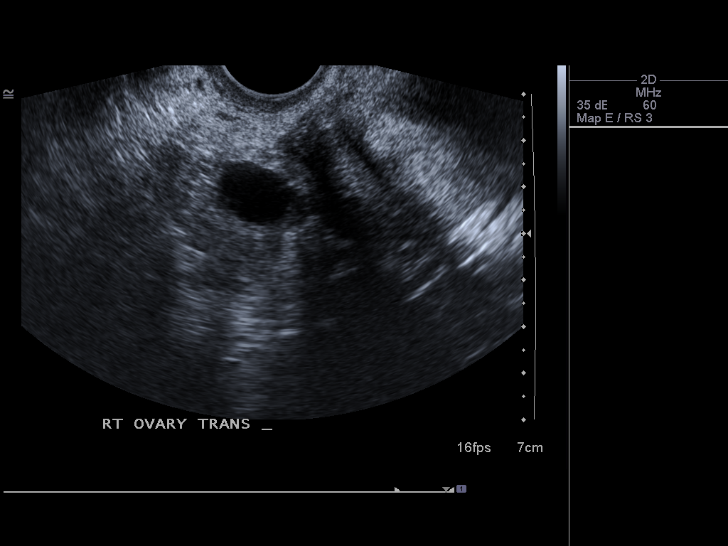
[im 50/60]
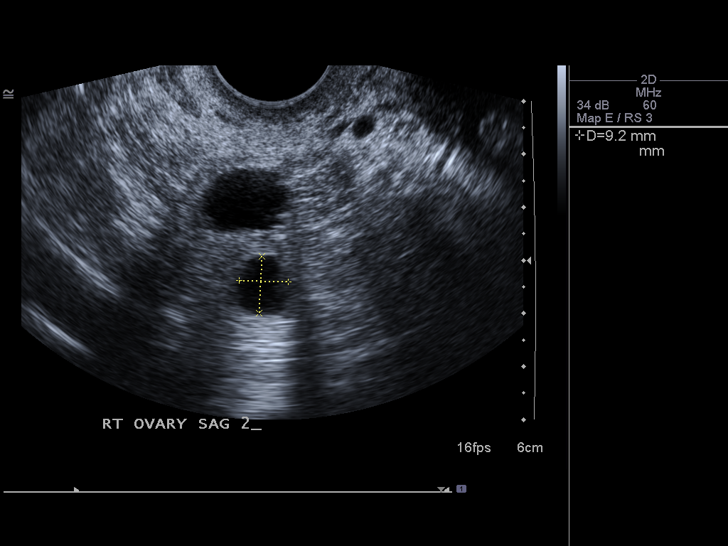
[im 55/60]
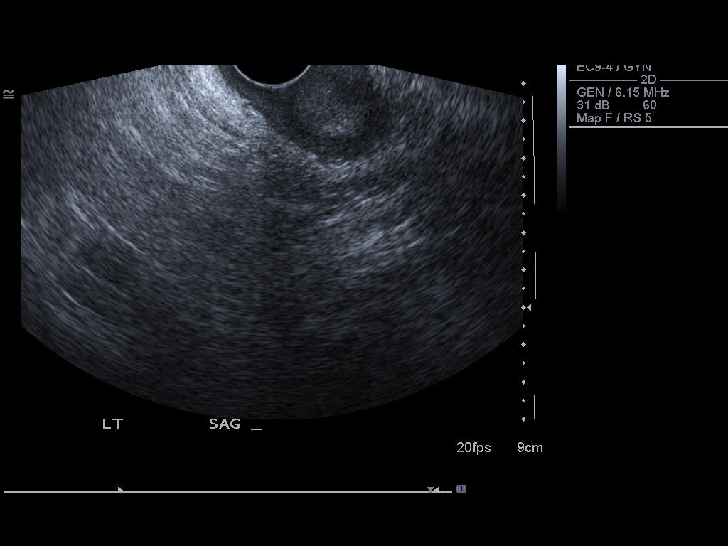
[im 60/60]
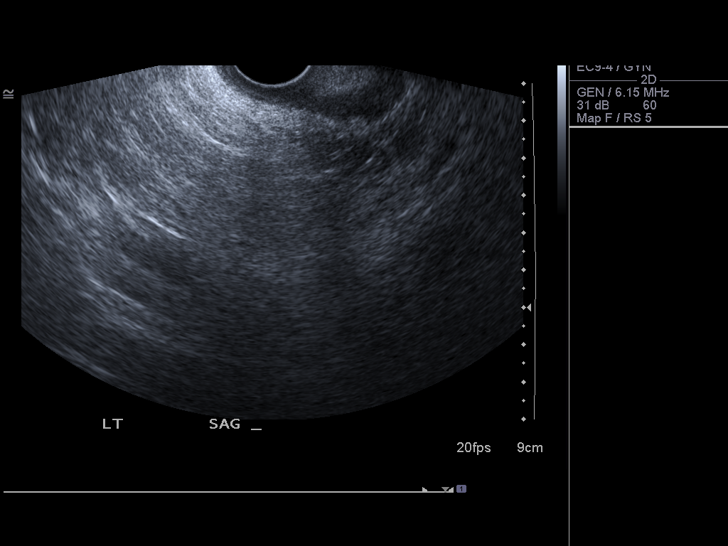

[14 of 25 positions shown; findings below may reference images not displayed]

FINDINGS: Uterus:  9.6 x 5.2 x 4.7 cm.  Anteverted, anteflexed.  Multiple
fibroids as follows:

Anterior uterine fundus, intramural, partly calcified, 2.5 x 2.2 x
2.2 cm.

Posterior uterine body, subserosal / intramural, 1.8 x 1.8 x
cm.

Uterine fundus, subserosal / intramural, 1.8 x 1.7 x 1.4 cm.

Endometrium: 8 mm.  Borderline trilaminar in appearance without
focal abnormality.

Right ovary: 2.6 x 2.4 x 2.2 cm.  Normal.

Left ovary: Not visualized.  No left adnexal mass.

Other Findings:  No free fluid
IMPRESSION: Uterine fibroids as above.  No pedunculated subserosal or
submucosal component.

## 2013-04-23 MED ORDER — HYDROMORPHONE HCL PF 1 MG/ML IJ SOLN
1.0000 mg | Freq: Once | INTRAMUSCULAR | Status: AC
  Administered 2013-04-23: 1 mg via INTRAVENOUS
  Filled 2013-04-23: qty 1

## 2013-04-23 MED ORDER — OXYCODONE-ACETAMINOPHEN 5-325 MG PO TABS: 1.0000 | ORAL_TABLET | ORAL | Status: DC | PRN

## 2013-04-23 MED ORDER — ONDANSETRON HCL 4 MG/2ML IJ SOLN
4.0000 mg | Freq: Once | INTRAMUSCULAR | Status: AC
  Administered 2013-04-23: 4 mg via INTRAVENOUS
  Filled 2013-04-23: qty 2

## 2013-04-23 NOTE — ED Provider Notes (Signed)
Medical screening examination/treatment/procedure(s) were performed by non-physician practitioner and as supervising physician I was immediately available for consultation/collaboration.  Doug Sou, MD 04/23/13 1537

## 2013-04-23 NOTE — ED Provider Notes (Signed)
CSN: [id number]     Arrival date & time 04/23/13  1208 History   First MD Initiated Contact with Patient 04/23/13 1230     No chief complaint on file.  (Consider location/radiation/quality/duration/timing/severity/associated sxs/prior Treatment) HPI Comments: Pt states that she had acute onset of rlq pain this morning:some nausea,no vomiting or diarrhea:history of ectopic pregnancy, ovarian cyst, does still have appendix:pt states that she has had some discharge in the last 2 days:has not tried anything for pain:pt states that it has eased off a little, but this is similar to previous ectopic pain  The history is provided by the patient. No language interpreter was used.    No past medical history on file. No past surgical history on file. No family history on file. History  Substance Use Topics  . Smoking status: Not on file  . Smokeless tobacco: Not on file  . Alcohol Use: Not on file   OB History   No data available     Review of Systems  Constitutional: Negative.   Respiratory: Negative.   Cardiovascular: Negative.     Allergies  Morphine sulfate  Home Medications  No current outpatient prescriptions on file. BP 173/109  Pulse 91  Temp(Src) 97.2 F (36.2 C) (Oral)  Resp 18  Ht 5' 7.5" (1.715 m)  Wt 204 lb (92.534 kg)  BMI 31.46 kg/m2  SpO2 99% Physical Exam  Nursing note and vitals reviewed. Constitutional: She is oriented to person, place, and time. She appears well-developed and well-nourished.  HENT:  Head: Normocephalic and atraumatic.  Eyes: Conjunctivae and EOM are normal.  Neck: Normal range of motion. Neck supple.  Cardiovascular: Normal rate and regular rhythm.   Pulmonary/Chest: Effort normal and breath sounds normal.  Abdominal: Soft. Bowel sounds are normal.  rlq tenderness  Musculoskeletal: Normal range of motion.  Neurological: She is alert and oriented to person, place, and time.  Skin: Skin is warm and dry.  Psychiatric: She has a normal  mood and affect.    ED Course  Procedures (including critical care time) Labs Review Labs Reviewed  URINALYSIS, ROUTINE W REFLEX MICROSCOPIC - Abnormal; Notable for the following:    APPearance CLOUDY (*)    All other components within normal limits  CBC WITH DIFFERENTIAL - Abnormal; Notable for the following:    WBC 12.3 (*)    Neutro Abs 9.2 (*)    All other components within normal limits  GC/CHLAMYDIA PROBE AMP  WET PREP, GENITAL  PREGNANCY, URINE  COMPREHENSIVE METABOLIC PANEL  RPR   Imaging Review US Transvaginal Non-ob  04/23/2013   *RADIOLOGY REPORT*  Clinical Data: Right lower quadrant pelvic pain  TRANSABDOMINAL AND TRANSVAGINAL ULTRASOUND OF PELVIS  Technique:  Both transabdominal and transvaginal ultrasound examinations of the pelvis were performed.  Transabdominal technique was performed for global imaging of the pelvis including uterus, ovaries, adnexal regions, and pelvic cul-de-sac.  It was necessary to proceed with endovaginal exam following the transabdominal exam to visualize the endometrium and ovaries.  Comparison:  03/03/2005 ultrasound, CT 03/02/2005  Findings: Uterus:  9.6 x 5.2 x 4.7 cm.  Anteverted, anteflexed.  Multiple fibroids as follows:  Anterior uterine fundus, intramural, partly calcified, 2.5 x 2.2 x 2.2 cm.  Posterior uterine body, subserosal / intramural, 1.8 x 1.8 x 1.4 cm.  Uterine fundus, subserosal / intramural, 1.8 x 1.7 x 1.4 cm.  Endometrium: 8 mm.  Borderline trilaminar in appearance without focal abnormality.  Right ovary: 2.6 x 2.4 x 2.2 cm.  Normal.  Left  ovary: Not visualized.  No left adnexal mass.  Other Findings:  No free fluid  IMPRESSION: Uterine fibroids as above.  No pedunculated subserosal or submucosal component.   Original Report Authenticated By: Christiana Pellant, M.D.   US Pelvis Complete  04/23/2013   *RADIOLOGY REPORT*  Clinical Data: Right lower quadrant pelvic pain  TRANSABDOMINAL AND TRANSVAGINAL ULTRASOUND OF PELVIS  Technique:   Both transabdominal and transvaginal ultrasound examinations of the pelvis were performed.  Transabdominal technique was performed for global imaging of the pelvis including uterus, ovaries, adnexal regions, and pelvic cul-de-sac.  It was necessary to proceed with endovaginal exam following the transabdominal exam to visualize the endometrium and ovaries.  Comparison:  03/03/2005 ultrasound, CT 03/02/2005  Findings: Uterus:  9.6 x 5.2 x 4.7 cm.  Anteverted, anteflexed.  Multiple fibroids as follows:  Anterior uterine fundus, intramural, partly calcified, 2.5 x 2.2 x 2.2 cm.  Posterior uterine body, subserosal / intramural, 1.8 x 1.8 x 1.4 cm.  Uterine fundus, subserosal / intramural, 1.8 x 1.7 x 1.4 cm.  Endometrium: 8 mm.  Borderline trilaminar in appearance without focal abnormality.  Right ovary: 2.6 x 2.4 x 2.2 cm.  Normal.  Left ovary: Not visualized.  No left adnexal mass.  Other Findings:  No free fluid  IMPRESSION: Uterine fibroids as above.  No pedunculated subserosal or submucosal component.   Original Report Authenticated By: Christiana Pellant, M.D.    MDM   1. Fibroids    Likely the source of pain:pt is okay to follow up with XBJ:YNWGN appendicitis although discussed the possibility with pt if symptoms continue or worsen:pt has Dr. Tyna Jaksch as her FA:OZHY send home with something for pain    Teressa Lower, NP 04/23/13 1521

## 2013-04-24 LAB — GC/CHLAMYDIA PROBE AMP: CT Probe RNA: NEGATIVE

## 2013-08-24 LAB — HM PAP SMEAR: HM Pap smear: NORMAL

## 2013-09-07 ENCOUNTER — Encounter: Payer: Self-pay | Admitting: Family

## 2013-09-07 ENCOUNTER — Ambulatory Visit (INDEPENDENT_AMBULATORY_CARE_PROVIDER_SITE_OTHER): Payer: BC Managed Care – PPO | Admitting: Family

## 2013-09-07 VITALS — BP 130/90 | HR 82 | Ht 67.0 in | Wt 230.0 lb

## 2013-09-07 DIAGNOSIS — E669 Obesity, unspecified: Secondary | ICD-10-CM

## 2013-09-07 DIAGNOSIS — F411 Generalized anxiety disorder: Secondary | ICD-10-CM

## 2013-09-07 DIAGNOSIS — G8929 Other chronic pain: Secondary | ICD-10-CM

## 2013-09-07 DIAGNOSIS — R1031 Right lower quadrant pain: Secondary | ICD-10-CM

## 2013-09-07 MED ORDER — BUPROPION HCL ER (XL) 150 MG PO TB24: 150.0000 mg | ORAL_TABLET | Freq: Every day | ORAL | Status: DC

## 2013-09-07 NOTE — Patient Instructions (Signed)

## 2013-09-07 NOTE — Progress Notes (Signed)
Pre visit review using our clinic review tool, if applicable. No additional management support is needed unless otherwise documented below in the visit note. 

## 2013-09-10 NOTE — Progress Notes (Signed)
Subjective:    Patient ID: Holly Horn, female    DOB: September 02, 1970, 43 y.o.   MRN: [id number]  HPI  43 year old female, nonsmoker, G4 P0 A4, presents today to be established. She reports right lower quadrant pain since her ED visit on 04/23/13. The pain occurs intermittently and has had 3 episodes since her ED visit. She has a history of Fibroid Tumors but reports gynecology does not feel it is the reason for her pain. She has had 3 ectopic pregnancies and had to have one tube surgically removed. Reports having 4-5 bowel movements a day. Patient declines further workout.   Reports increased stress, working 90 hours a week. Reports her dad sustaining a gunshot wound to the face in 2010 and now has severe deformity of the face. She reports financially supporting her father. Also reports her brother living with her due to him going through a divorce.   Review of Systems  Constitutional: Negative.   HENT: Negative.   Respiratory: Negative.   Cardiovascular: Negative.   Gastrointestinal: Positive for abdominal pain. Negative for blood in stool, abdominal distention and anal bleeding.  Endocrine: Negative.   Genitourinary: Negative.   Musculoskeletal: Negative.   Skin: Negative.   Neurological: Negative.   Psychiatric/Behavioral: Negative.    Past Medical History  Diagnosis Date  . GERD (gastroesophageal reflux disease)   . Ectopic pregnancy     x 3    History   Social History  . Marital Status: Single    Spouse Name: N/A    Number of Children: N/A  . Years of Education: N/A   Occupational History  . Not on file.   Social History Main Topics  . Smoking status: Current Some Day Smoker  . Smokeless tobacco: Not on file  . Alcohol Use: Yes  . Drug Use: No  . Sexual Activity: Not on file   Other Topics Concern  . Not on file   Social History Narrative  . No narrative on file    Past Surgical History  Procedure Laterality Date  . Ectopic pregnancy surgery      No  family history on file.  Allergies  Allergen Reactions  . Morphine Sulfate     REACTION: vomiting, SOB    No current outpatient prescriptions on file prior to visit.   No current facility-administered medications on file prior to visit.    BP 130/90  Pulse 82  Ht 5\' 7"  (1.702 m)  Wt 230 lb (104.327 kg)  BMI 36.01 kg/m2  LMP 01/15/2015chart    Objective:   Physical Exam  Constitutional: She is oriented to person, place, and time. She appears well-developed and well-nourished.  HENT:  Right Ear: External ear normal.  Left Ear: External ear normal.  Nose: Nose normal.  Mouth/Throat: Oropharynx is clear and moist.  Neck: Normal range of motion. Neck supple.  Cardiovascular: Normal rate, regular rhythm and normal heart sounds.   Pulmonary/Chest: Effort normal and breath sounds normal.  Abdominal: Soft. She exhibits no mass. There is tenderness. There is no rebound and no guarding.  Right lower quadrant tenderness  Musculoskeletal: Normal range of motion.  Neurological: She is alert and oriented to person, place, and time.  Skin: Skin is warm and dry.  Psychiatric: She has a normal mood and affect.          Assessment & Plan:  Assessments: 1. Right Lower Quadrant Pain-Chronic Consider CT of the abdomen and pelvis or referral to GI when patient is  financially prepared.  2. Fibroid Tumors 3. Anxiety 4. Acute Reaction to Stress- Start Wellbutrin 150mg  once daily.  Exercise daily.

## 2013-09-12 ENCOUNTER — Telehealth: Payer: Self-pay | Admitting: Family

## 2013-09-12 NOTE — Telephone Encounter (Signed)
Relevant patient education assigned to patient using Emmi. ° °

## 2013-09-19 LAB — HM MAMMOGRAPHY

## 2014-06-11 ENCOUNTER — Encounter: Payer: Self-pay | Admitting: Family

## 2014-06-11 ENCOUNTER — Telehealth: Payer: Self-pay | Admitting: Family

## 2014-06-11 ENCOUNTER — Ambulatory Visit (INDEPENDENT_AMBULATORY_CARE_PROVIDER_SITE_OTHER): Payer: BC Managed Care – PPO | Admitting: Family

## 2014-06-11 VITALS — BP 124/80 | HR 87 | Ht 67.0 in | Wt 233.6 lb

## 2014-06-11 DIAGNOSIS — F329 Major depressive disorder, single episode, unspecified: Secondary | ICD-10-CM

## 2014-06-11 DIAGNOSIS — J209 Acute bronchitis, unspecified: Secondary | ICD-10-CM

## 2014-06-11 DIAGNOSIS — Z720 Tobacco use: Secondary | ICD-10-CM

## 2014-06-11 DIAGNOSIS — F32A Depression, unspecified: Secondary | ICD-10-CM

## 2014-06-11 DIAGNOSIS — Z0000 Encounter for general adult medical examination without abnormal findings: Secondary | ICD-10-CM

## 2014-06-11 DIAGNOSIS — Z1231 Encounter for screening mammogram for malignant neoplasm of breast: Secondary | ICD-10-CM

## 2014-06-11 DIAGNOSIS — Z23 Encounter for immunization: Secondary | ICD-10-CM

## 2014-06-11 LAB — COMPREHENSIVE METABOLIC PANEL
ALK PHOS: 76 U/L (ref 39–117)
ALT: 17 U/L (ref 0–35)
AST: 17 U/L (ref 0–37)
Albumin: 3.4 g/dL — ABNORMAL LOW (ref 3.5–5.2)
BILIRUBIN TOTAL: 0.5 mg/dL (ref 0.2–1.2)
BUN: 9 mg/dL (ref 6–23)
CO2: 26 mEq/L (ref 19–32)
Calcium: 9.2 mg/dL (ref 8.4–10.5)
Chloride: 105 mEq/L (ref 96–112)
Creatinine, Ser: 0.7 mg/dL (ref 0.4–1.2)
GFR: 90.49 mL/min (ref >60.00)
GLUCOSE: 88 mg/dL (ref 70–99)
Potassium: 4.3 mEq/L (ref 3.5–5.1)
SODIUM: 136 meq/L (ref 135–145)
TOTAL PROTEIN: 6.9 g/dL (ref 6.0–8.3)

## 2014-06-11 LAB — POCT URINALYSIS DIPSTICK
Bilirubin, UA: NEGATIVE
Glucose, UA: NEGATIVE
KETONES UA: NEGATIVE
Leukocytes, UA: NEGATIVE
Nitrite, UA: NEGATIVE
PH UA: 6.5
PROTEIN UA: NEGATIVE
SPEC GRAV UA: 1.015
Urobilinogen, UA: 0.2

## 2014-06-11 LAB — LIPID PANEL
Cholesterol: 169 mg/dL (ref 0–200)
HDL: 64.2 mg/dL (ref >39.00)
LDL CALC: 90 mg/dL (ref 0–99)
NONHDL: 104.8
Total CHOL/HDL Ratio: 3
Triglycerides: 76 mg/dL (ref 0.0–149.0)
VLDL: 15.2 mg/dL (ref 0.0–40.0)

## 2014-06-11 LAB — CBC WITH DIFFERENTIAL/PLATELET
BASOS PCT: 0.4 % (ref 0.0–3.0)
Basophils Absolute: 0 10*3/uL (ref 0.0–0.1)
EOS PCT: 1.6 % (ref 0.0–5.0)
Eosinophils Absolute: 0.2 10*3/uL (ref 0.0–0.7)
HEMATOCRIT: 38.3 % (ref 36.0–46.0)
HEMOGLOBIN: 12.4 g/dL (ref 12.0–15.0)
LYMPHS ABS: 3.3 10*3/uL (ref 0.7–4.0)
Lymphocytes Relative: 31.5 % (ref 12.0–46.0)
MCHC: 32.4 g/dL (ref 30.0–36.0)
MCV: 91 fl (ref 78.0–100.0)
MONO ABS: 0.7 10*3/uL (ref 0.1–1.0)
Monocytes Relative: 6.4 % (ref 3.0–12.0)
NEUTROS ABS: 6.4 10*3/uL (ref 1.4–7.7)
Neutrophils Relative %: 60.1 % (ref 43.0–77.0)
Platelets: 293 10*3/uL (ref 150.0–400.0)
RBC: 4.21 Mil/uL (ref 3.87–5.11)
RDW: 13 % (ref 11.5–15.5)
WBC: 10.6 10*3/uL — AB (ref 4.0–10.5)

## 2014-06-11 LAB — TSH: TSH: 1.19 u[IU]/mL (ref 0.35–4.50)

## 2014-06-11 MED ORDER — ALBUTEROL SULFATE HFA 108 (90 BASE) MCG/ACT IN AERS: 2.0000 | INHALATION_SPRAY | Freq: Four times a day (QID) | RESPIRATORY_TRACT | Status: DC | PRN

## 2014-06-11 MED ORDER — ALBUTEROL SULFATE (2.5 MG/3ML) 0.083% IN NEBU
2.5000 mg | INHALATION_SOLUTION | Freq: Once | RESPIRATORY_TRACT | Status: AC
  Administered 2014-06-11: 2.5 mg via RESPIRATORY_TRACT

## 2014-06-11 MED ORDER — PREDNISONE 20 MG PO TABS: ORAL_TABLET | ORAL | Status: AC

## 2014-06-11 MED ORDER — BUPROPION HCL ER (XL) 150 MG PO TB24: 150.0000 mg | ORAL_TABLET | Freq: Every day | ORAL | Status: DC

## 2014-06-11 NOTE — Progress Notes (Signed)
Pre visit review using our clinic review tool, if applicable. No additional management support is needed unless otherwise documented below in the visit note. 

## 2014-06-11 NOTE — Telephone Encounter (Signed)
Error/njr °

## 2014-06-11 NOTE — Patient Instructions (Signed)
Smoking Cessation Quitting smoking is important to your health and has many advantages. However, it is not always easy to quit since nicotine is a very addictive drug. Oftentimes, people try 3 times or more before being able to quit. This document explains the best ways for you to prepare to quit smoking. Quitting takes hard work and a lot of effort, but you can do it. ADVANTAGES OF QUITTING SMOKING  You will live longer, feel better, and live better.  Your body will feel the impact of quitting smoking almost immediately.  Within 20 minutes, blood pressure decreases. Your pulse returns to its normal level.  After 8 hours, carbon monoxide levels in the blood return to normal. Your oxygen level increases.  After 24 hours, the chance of having a heart attack starts to decrease. Your breath, hair, and body stop smelling like smoke.  After 48 hours, damaged nerve endings begin to recover. Your sense of taste and smell improve.  After 72 hours, the body is virtually free of nicotine. Your bronchial tubes relax and breathing becomes easier.  After 2 to 12 weeks, lungs can hold more air. Exercise becomes easier and circulation improves.  The risk of having a heart attack, stroke, cancer, or lung disease is greatly reduced.  After 1 year, the risk of coronary heart disease is cut in half.  After 5 years, the risk of stroke falls to the same as a nonsmoker.  After 10 years, the risk of lung cancer is cut in half and the risk of other cancers decreases significantly.  After 15 years, the risk of coronary heart disease drops, usually to the level of a nonsmoker.  If you are pregnant, quitting smoking will improve your chances of having a healthy baby.  The people you live with, especially any children, will be healthier.  You will have extra money to spend on things other than cigarettes. QUESTIONS TO THINK ABOUT BEFORE ATTEMPTING TO QUIT You may want to talk about your answers with your  health care provider.  Why do you want to quit?  If you tried to quit in the past, what helped and what did not?  What will be the most difficult situations for you after you quit? How will you plan to handle them?  Who can help you through the tough times? Your family? Friends? A health care provider?  What pleasures do you get from smoking? What ways can you still get pleasure if you quit? Here are some questions to ask your health care provider:  How can you help me to be successful at quitting?  What medicine do you think would be best for me and how should I take it?  What should I do if I need more help?  What is smoking withdrawal like? How can I get information on withdrawal? GET READY  Set a quit date.  Change your environment by getting rid of all cigarettes, ashtrays, matches, and lighters in your home, car, or work. Do not let people smoke in your home.  Review your past attempts to quit. Think about what worked and what did not. GET SUPPORT AND ENCOURAGEMENT You have a better chance of being successful if you have help. You can get support in many ways.  Tell your family, friends, and coworkers that you are going to quit and need their support. Ask them not to smoke around you.  Get individual, group, or telephone counseling and support. Programs are available at local hospitals and health centers. Call   your local health department for information about programs in your area.  Spiritual beliefs and practices may help some smokers quit.  Download a "quit meter" on your computer to keep track of quit statistics, such as how long you have gone without smoking, cigarettes not smoked, and money saved.  Get a self-help book about quitting smoking and staying off tobacco. LEARN NEW SKILLS AND BEHAVIORS  Distract yourself from urges to smoke. Talk to someone, go for a walk, or occupy your time with a task.  Change your normal routine. Take a different route to work.  Drink tea instead of coffee. Eat breakfast in a different place.  Reduce your stress. Take a hot bath, exercise, or read a book.  Plan something enjoyable to do every day. Reward yourself for not smoking.  Explore interactive web-based programs that specialize in helping you quit. GET MEDICINE AND USE IT CORRECTLY Medicines can help you stop smoking and decrease the urge to smoke. Combining medicine with the above behavioral methods and support can greatly increase your chances of successfully quitting smoking.  Nicotine replacement therapy helps deliver nicotine to your body without the negative effects and risks of smoking. Nicotine replacement therapy includes nicotine gum, lozenges, inhalers, nasal sprays, and skin patches. Some may be available over-the-counter and others require a prescription.  Antidepressant medicine helps people abstain from smoking, but how this works is unknown. This medicine is available by prescription.  Nicotinic receptor partial agonist medicine simulates the effect of nicotine in your brain. This medicine is available by prescription. Ask your health care provider for advice about which medicines to use and how to use them based on your health history. Your health care provider will tell you what side effects to look out for if you choose to be on a medicine or therapy. Carefully read the information on the package. Do not use any other product containing nicotine while using a nicotine replacement product.  RELAPSE OR DIFFICULT SITUATIONS Most relapses occur within the first 3 months after quitting. Do not be discouraged if you start smoking again. Remember, most people try several times before finally quitting. You may have symptoms of withdrawal because your body is used to nicotine. You may crave cigarettes, be irritable, feel very hungry, cough often, get headaches, or have difficulty concentrating. The withdrawal symptoms are only temporary. They are strongest  when you first quit, but they will go away within 10-14 days. To reduce the chances of relapse, try to:  Avoid drinking alcohol. Drinking lowers your chances of successfully quitting.  Reduce the amount of caffeine you consume. Once you quit smoking, the amount of caffeine in your body increases and can give you symptoms, such as a rapid heartbeat, sweating, and anxiety.  Avoid smokers because they can make you want to smoke.  Do not let weight gain distract you. Many smokers will gain weight when they quit, usually less than 10 pounds. Eat a healthy diet and stay active. You can always lose the weight gained after you quit.  Find ways to improve your mood other than smoking. FOR MORE INFORMATION  www.smokefree.gov  Document Released: 07/20/2001 Document Revised: 12/10/2013 Document Reviewed: 11/04/2011 [employer] Patient Information 2015 [employer]. This information is not intended to replace advice given to you by your health care provider. Make sure you discuss any questions you have with your health care provider.  

## 2014-06-11 NOTE — Progress Notes (Signed)
Subjective:    Patient ID: Holly Horn, female    DOB: 1971-01-10, 43 y.o.   MRN: [id number]  HPI 43 year old white female, smoker, is in today for complete physical exam. Has concerns of cough, congestion, wheezing 2-3 days. Has not been taking any medication over-the-counter at this point. Smokes approximately 1/3 of a pack of cigarettes per day. Does not routinely exercise.   Review of Systems  Constitutional: Negative.   HENT: Positive for congestion, postnasal drip and rhinorrhea.   Eyes: Negative.   Respiratory: Positive for cough, shortness of breath and wheezing.   Cardiovascular: Negative.   Gastrointestinal: Negative.   Endocrine: Negative.   Genitourinary: Negative.   Musculoskeletal: Negative.   Skin: Negative.   Allergic/Immunologic: Negative.   Neurological: Negative.   Hematological: Negative.   Psychiatric/Behavioral: Negative.    Past Medical History  Diagnosis Date  . GERD (gastroesophageal reflux disease)   . Ectopic pregnancy     x 3    History   Social History  . Marital Status: Single    Spouse Name: N/A    Number of Children: N/A  . Years of Education: N/A   Occupational History  . Not on file.   Social History Main Topics  . Smoking status: Current Every Day Smoker  . Smokeless tobacco: Not on file  . Alcohol Use: 0.0 oz/week    0 Not specified per week  . Drug Use: No  . Sexual Activity: Not on file   Other Topics Concern  . Not on file   Social History Narrative    Past Surgical History  Procedure Laterality Date  . Ectopic pregnancy surgery      History reviewed. No pertinent family history.  Allergies  Allergen Reactions  . Morphine Sulfate     REACTION: vomiting, SOB    No current outpatient prescriptions on file prior to visit.   No current facility-administered medications on file prior to visit.    BP 124/80 mmHg  Pulse 87  Ht 5\' 7"  (1.702 m)  Wt 233 lb 9.6 oz (105.96 kg)  BMI 36.58 kg/m2chart      Objective:   Physical Exam  Constitutional: She is oriented to person, place, and time. She appears well-developed and well-nourished.  HENT:  Head: Normocephalic.  Right Ear: External ear normal.  Left Ear: External ear normal.  Nose: Nose normal.  Mouth/Throat: Oropharynx is clear and moist.  Eyes: Conjunctivae and EOM are normal. Pupils are equal, round, and reactive to light.  Neck: Normal range of motion. Neck supple. No thyromegaly present.  Cardiovascular: Normal rate, regular rhythm and normal heart sounds.   Pulmonary/Chest: Effort normal. She has wheezes.  Abdominal: Soft. Bowel sounds are normal.  Musculoskeletal: Normal range of motion.  Neurological: She is alert and oriented to person, place, and time. She has normal reflexes.  Skin: Skin is warm and dry.          Assessment & Plan:  Holly Horn was seen today for annual exam.  Diagnoses and associated orders for this visit:  Preventative health care - Cancel: EKG 12-Lead - TSH - CMP - CBC with Differential - POC Urinalysis Dipstick - Lipid Panel  Depression - TSH  Tobacco abuse  Acute bronchitis, unspecified organism - albuterol (PROVENTIL) (2.5 MG/3ML) 0.083% nebulizer solution 2.5 mg; Take 3 mLs (2.5 mg total) by nebulization once.  Need for prophylactic vaccination with combined diphtheria-tetanus-pertussis (DTP) vaccine - Tdap vaccine greater than or equal to 7yo IM  Other  Orders - buPROPion (WELLBUTRIN XL) 150 MG 24 hr tablet; Take 1 tablet (150 mg total) by mouth daily. - predniSONE (DELTASONE) 20 MG tablet; 60mg  PO qam x 3 days, 40mg  po qam x 3 days, 20mg  qam x 3 days - albuterol (PROVENTIL HFA;VENTOLIN HFA) 108 (90 BASE) MCG/ACT inhaler; Inhale 2 puffs into the lungs every 6 (six) hours as needed for wheezing or shortness of breath.    strongly encourage smoking cessation. Encouraged healthy diet, exercise.

## 2014-06-13 ENCOUNTER — Telehealth: Payer: Self-pay | Admitting: Family

## 2014-06-13 NOTE — Telephone Encounter (Signed)
Advised pt that Rx for cough medication would have to be picked up at the office if Abby Potash will authorize it. She is aware that Abby Potash is out of the office this afternoon but I will speak with her about it 06/14/14.

## 2014-06-13 NOTE — Telephone Encounter (Signed)
Pt calling to report severe cough she has and is requesting cough syrup.  Pt also states she was given proair inhaler and is not sure if this is causing her headache.  Wants to know if she should continue the proair.  Pharmacy:  [address] [facility] [address]

## 2014-06-14 MED ORDER — GUAIFENESIN-CODEINE 100-10 MG/5ML PO SYRP: 5.0000 mL | ORAL_SOLUTION | Freq: Three times a day (TID) | ORAL | Status: DC | PRN

## 2014-06-14 NOTE — Telephone Encounter (Signed)
Pt aware.

## 2014-06-14 NOTE — Telephone Encounter (Signed)
Pick up RX

## 2014-06-14 NOTE — Telephone Encounter (Signed)
Calling again to see if RX can be picked up this morning.

## 2014-07-09 ENCOUNTER — Ambulatory Visit: Payer: BC Managed Care – PPO | Admitting: Family

## 2014-07-23 ENCOUNTER — Ambulatory Visit: Payer: BC Managed Care – PPO | Admitting: Family

## 2015-03-06 ENCOUNTER — Other Ambulatory Visit: Payer: Self-pay | Admitting: Obstetrics and Gynecology

## 2015-03-06 DIAGNOSIS — R928 Other abnormal and inconclusive findings on diagnostic imaging of breast: Secondary | ICD-10-CM

## 2015-03-12 ENCOUNTER — Ambulatory Visit
Admission: RE | Admit: 2015-03-12 | Discharge: 2015-03-12 | Disposition: A | Discharge status: Home / Self Care | Payer: No Typology Code available for payment source | Source: Ambulatory Visit | Attending: Obstetrics and Gynecology | Admitting: Obstetrics and Gynecology

## 2015-03-12 ENCOUNTER — Other Ambulatory Visit: Payer: Self-pay | Admitting: Obstetrics and Gynecology

## 2015-03-12 DIAGNOSIS — N6310 Unspecified lump in the right breast, unspecified quadrant: Secondary | ICD-10-CM

## 2015-03-12 DIAGNOSIS — R928 Other abnormal and inconclusive findings on diagnostic imaging of breast: Secondary | ICD-10-CM

## 2015-03-12 IMAGING — MG MM DIAG BREAST TOMO UNI RIGHT
4 series · 4 of 12 positions shown · non-contrast
Comparison: Previous exam(s).

CLINICAL DATA: Recall from screening mammogram.

EXAM:
DIGITAL DIAGNOSTIC RIGHT MAMMOGRAM WITH 3D TOMOSYNTHESIS
ULTRASOUND RIGHT BREAST

[R MLO]
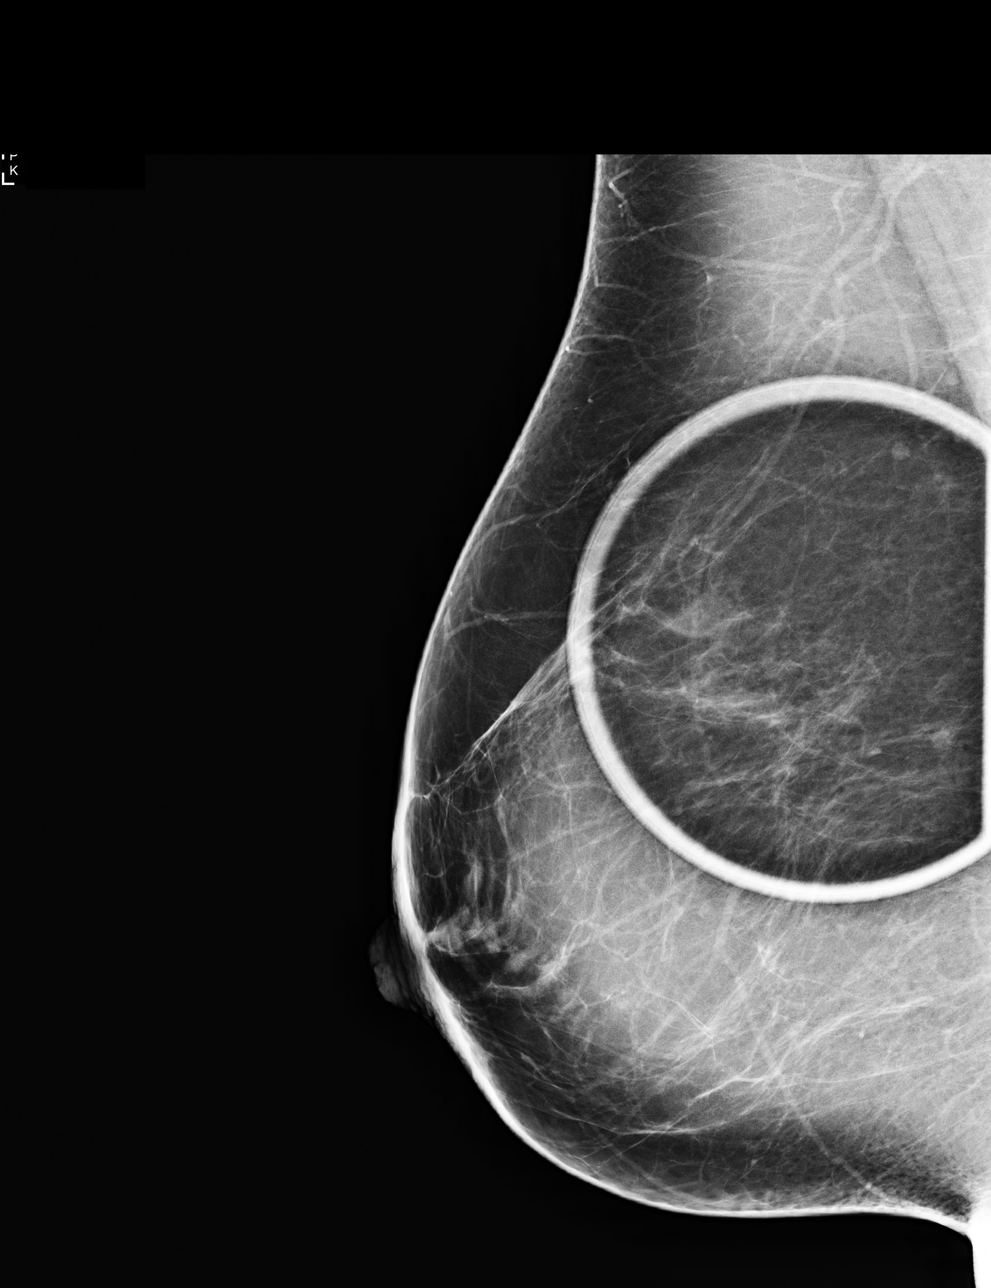

[R CC]
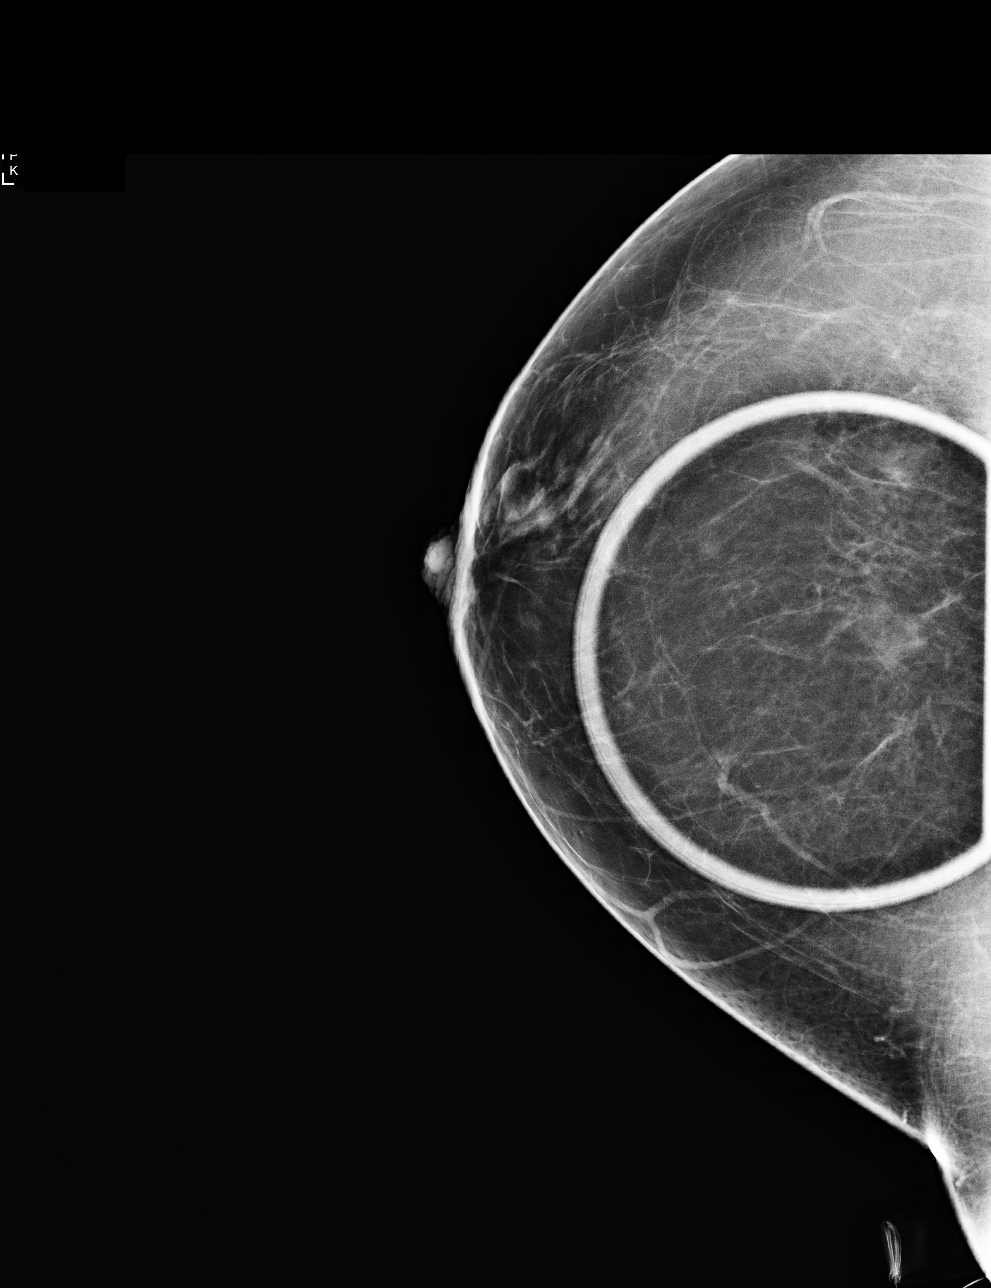

[R CC tomo · tomo slice 31/60.0]
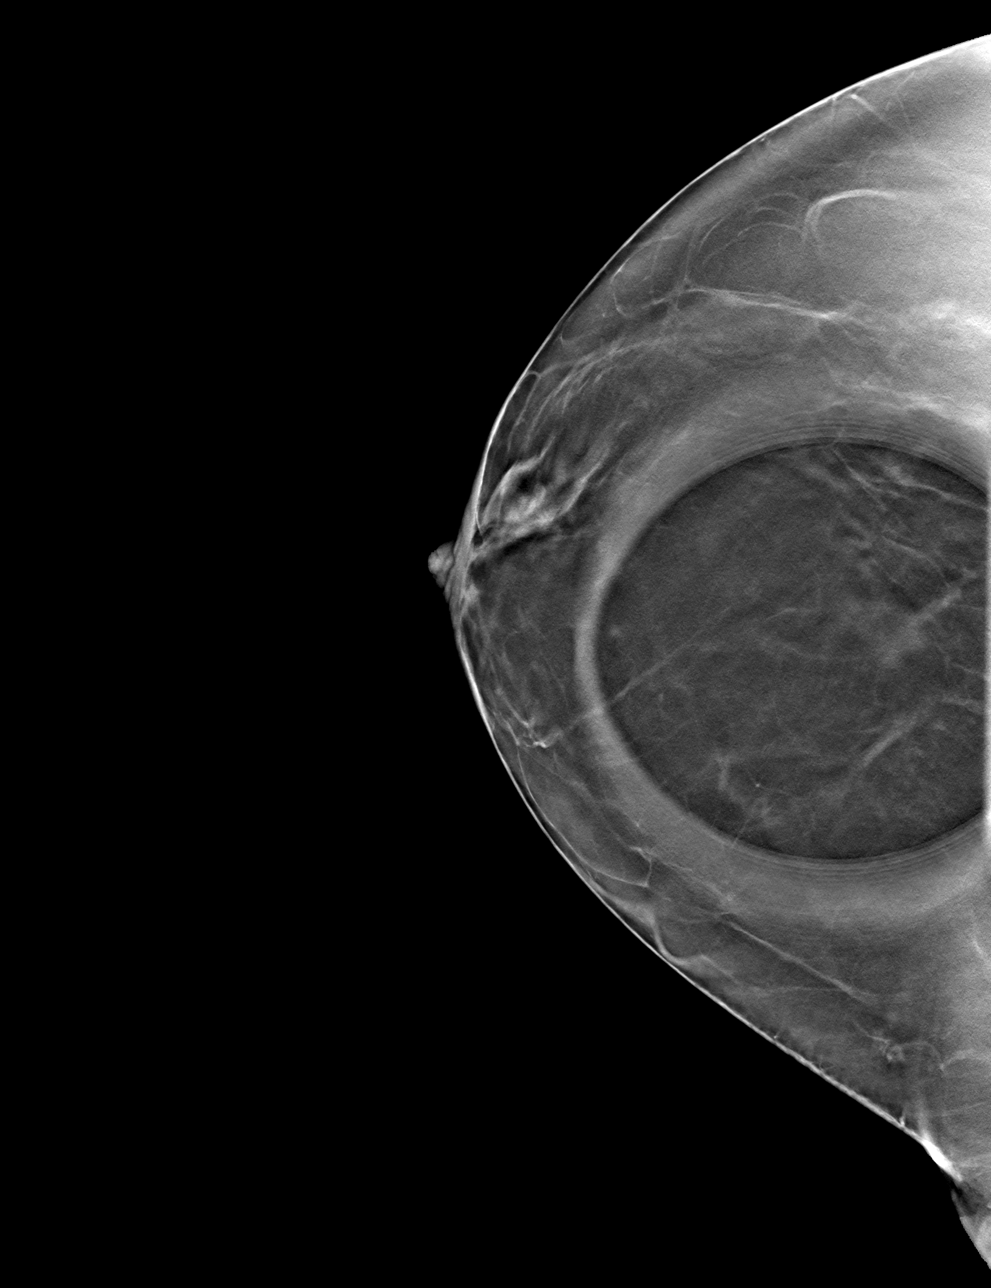

[R MLO tomo · tomo slice 34/67.0]
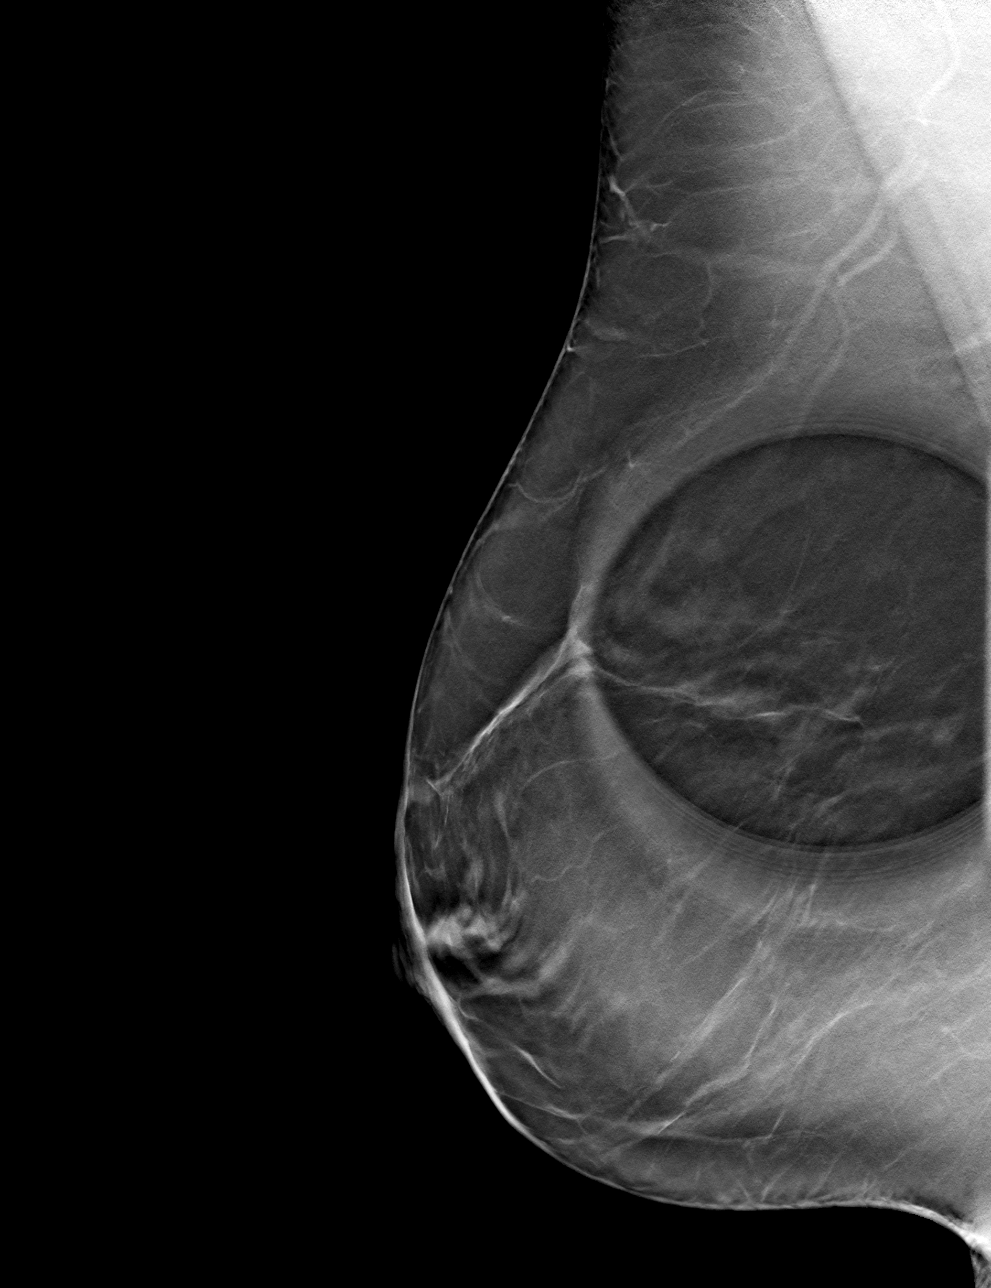

[4 of 12 positions shown; findings below may reference images not displayed]

ACR Breast Density Category b: There are scattered areas of
fibroglandular density.
FINDINGS: There is a persistent small ill-defined low-density asymmetry
located superiorly within the right breast at approximately the
12:30 o'clock position.

On physical exam, there is no discrete palpable abnormality within
the area of mammographic concern.

Targeted ultrasound is performed, showing no ultrasound correlate.
Tissue sampling is recommended. I have discussed tomosynthesis
guided biopsy. This will be scheduled.

Ultrasound of the right axilla demonstrates normal axillary contents
and no evidence for adenopathy.
IMPRESSION: Developing small ill-defined low-density lesion located superiorly
within the right breast at approximately the 12:30 o'clock position.
Right breast tomosynthesis guided biopsy is recommended and is
scheduled for [DATE].

RECOMMENDATION:
Right breast tomosynthesis guided biopsy.

I have discussed the findings and recommendations with the patient.
Results were also provided in writing at the conclusion of the
visit. If applicable, a reminder letter will be sent to the patient
regarding the next appointment.

BI-RADS CATEGORY  4: Suspicious.

## 2015-03-12 IMAGING — US US BREAST LTD UNI RIGHT INC AXILLA
1 series · 1 of 1 positions shown · non-contrast
Comparison: Previous exam(s).

CLINICAL DATA: Recall from screening mammogram.

EXAM:
DIGITAL DIAGNOSTIC RIGHT MAMMOGRAM WITH 3D TOMOSYNTHESIS
ULTRASOUND RIGHT BREAST

[Series 1: advbreast · 1 of 1 slices shown]
[im 1/1]
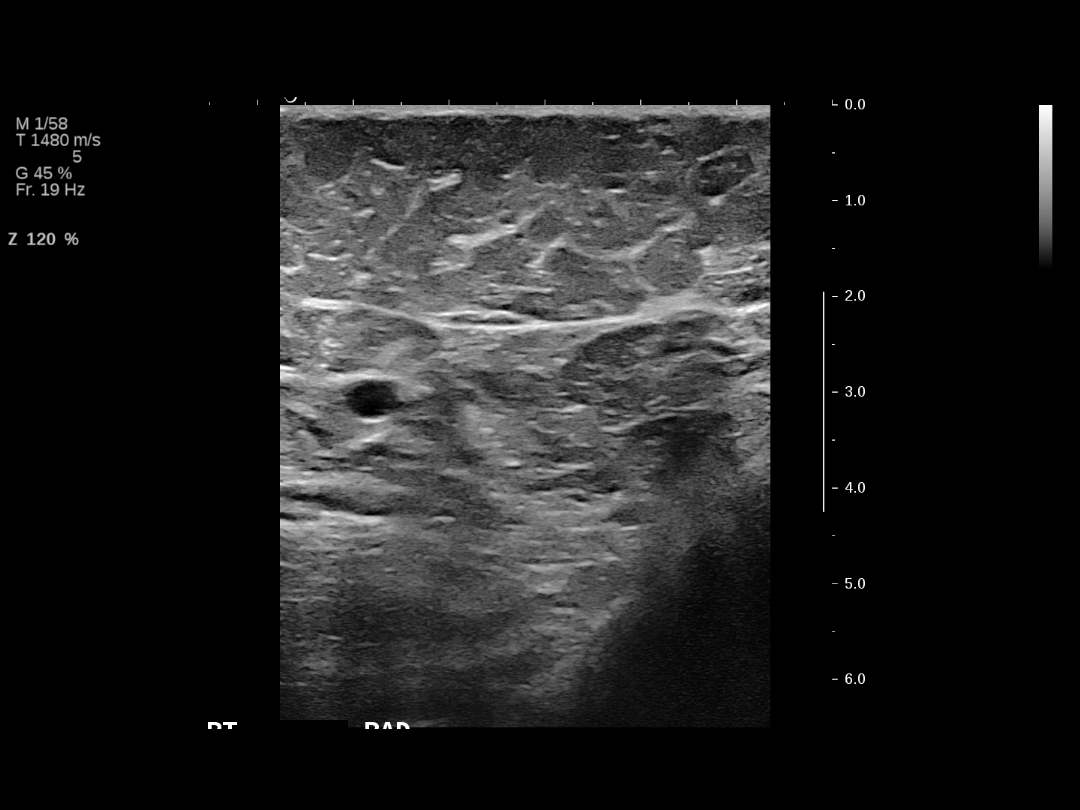

[1 of 1 positions shown; findings below may reference images not displayed]

ACR Breast Density Category b: There are scattered areas of
fibroglandular density.
FINDINGS: There is a persistent small ill-defined low-density asymmetry
located superiorly within the right breast at approximately the
12:30 o'clock position.

On physical exam, there is no discrete palpable abnormality within
the area of mammographic concern.

Targeted ultrasound is performed, showing no ultrasound correlate.
Tissue sampling is recommended. I have discussed tomosynthesis
guided biopsy. This will be scheduled.

Ultrasound of the right axilla demonstrates normal axillary contents
and no evidence for adenopathy.
IMPRESSION: Developing small ill-defined low-density lesion located superiorly
within the right breast at approximately the 12:30 o'clock position.
Right breast tomosynthesis guided biopsy is recommended and is
scheduled for [DATE].

RECOMMENDATION:
Right breast tomosynthesis guided biopsy.

I have discussed the findings and recommendations with the patient.
Results were also provided in writing at the conclusion of the
visit. If applicable, a reminder letter will be sent to the patient
regarding the next appointment.

BI-RADS CATEGORY  4: Suspicious.

## 2015-03-19 ENCOUNTER — Other Ambulatory Visit: Payer: Self-pay | Admitting: Obstetrics and Gynecology

## 2015-03-19 ENCOUNTER — Ambulatory Visit
Admission: RE | Admit: 2015-03-19 | Discharge: 2015-03-19 | Disposition: A | Discharge status: Home / Self Care | Payer: No Typology Code available for payment source | Source: Ambulatory Visit | Attending: Obstetrics and Gynecology | Admitting: Obstetrics and Gynecology

## 2015-03-19 DIAGNOSIS — N6310 Unspecified lump in the right breast, unspecified quadrant: Secondary | ICD-10-CM

## 2015-03-19 IMAGING — MG TOMO BIOPSY, RCC
7 series · 8 of 11 positions shown · non-contrast
Comparison: Previous exams.

ADDENDUM:
Pathology revealed fibrocystic changes and pseudoangiomatous stromal
hyperplasia (PASH) in the right breast. This was found to be
concordant by Dr. VENTURA. Pathology was discussed with the
patient by telephone. She reported doing well after the biopsy with
tenderness at the site. Post biopsy instructions and care were
reviewed and her questions were answered. She was encouraged to call
She was asked to return for right diagnostic mammography in 6
months.

Pathology results reported by VENTURA RN, BSN on [DATE].
CLINICAL DATA: Patient presents for stereotactic guided core biopsy
of developing density in the upper inner quadrant of the right
breast.
EXAM:
RIGHT BREAST STEREOTACTIC CORE NEEDLE BIOPSY

[R CC (1 of 6)]
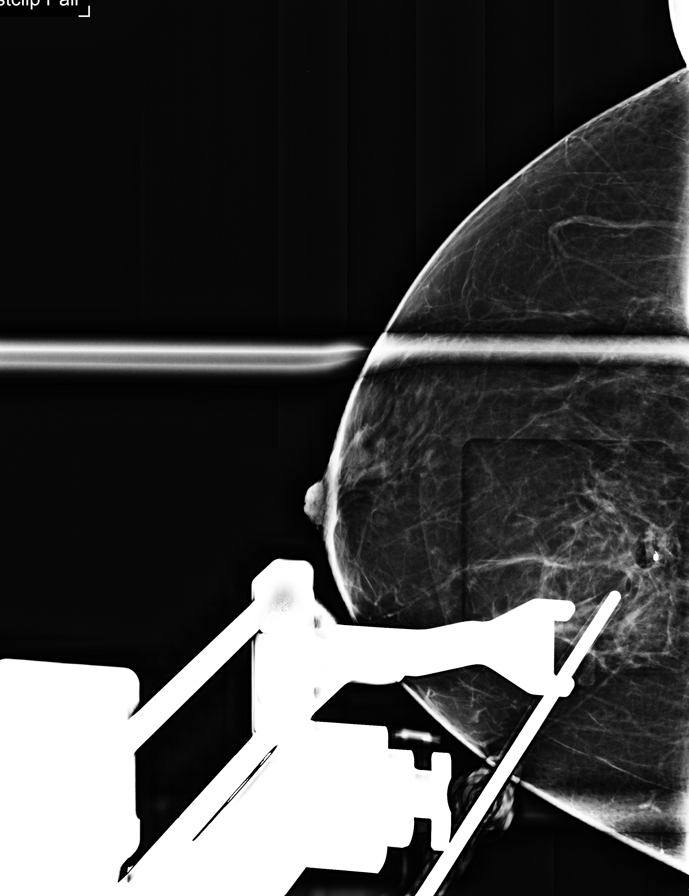

[R CC (2 of 6)]
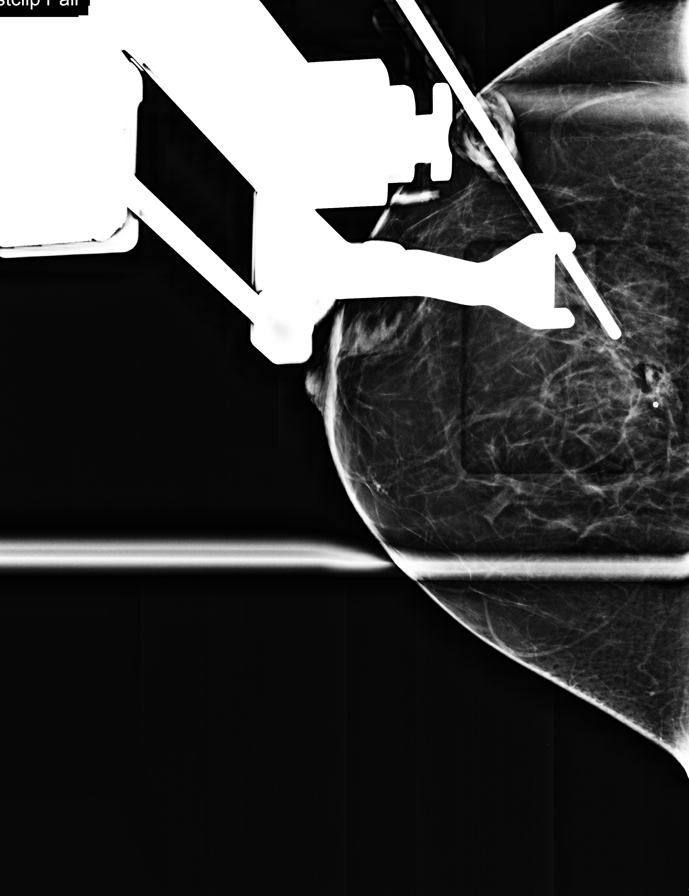

[R CC (3 of 6)]
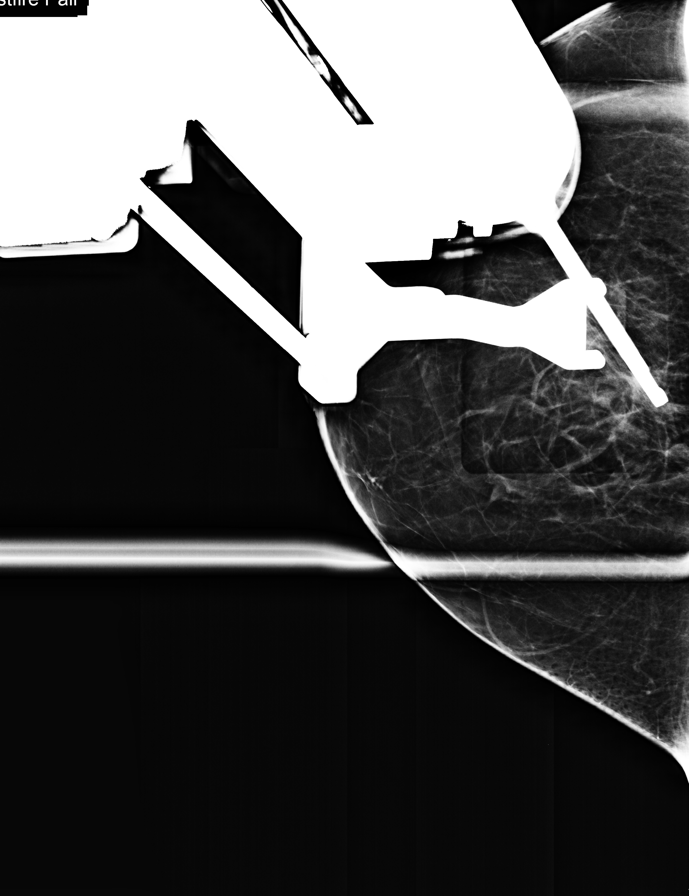

[R CC (4 of 6)]
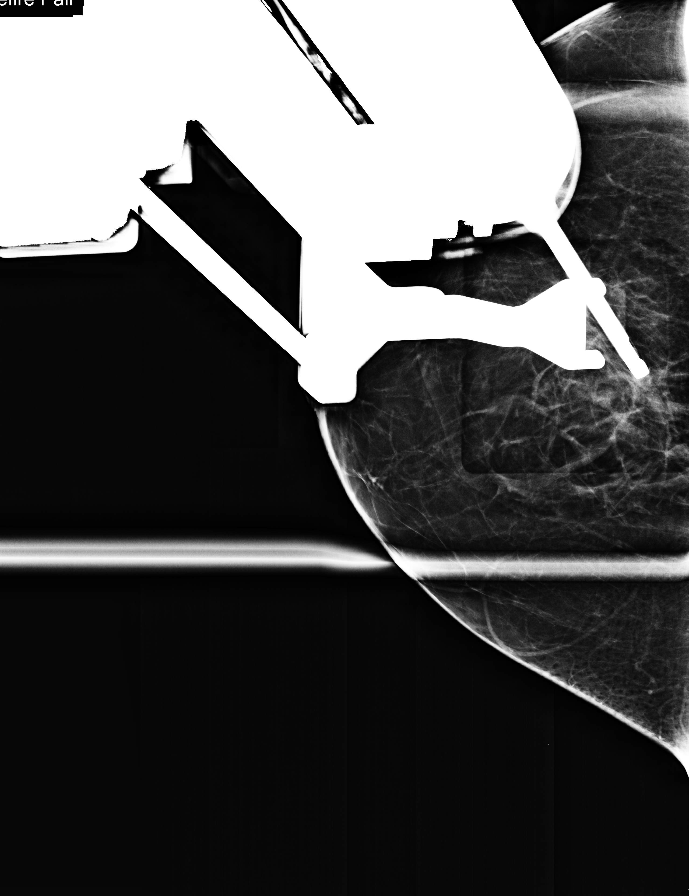

[R CC (5 of 6)]
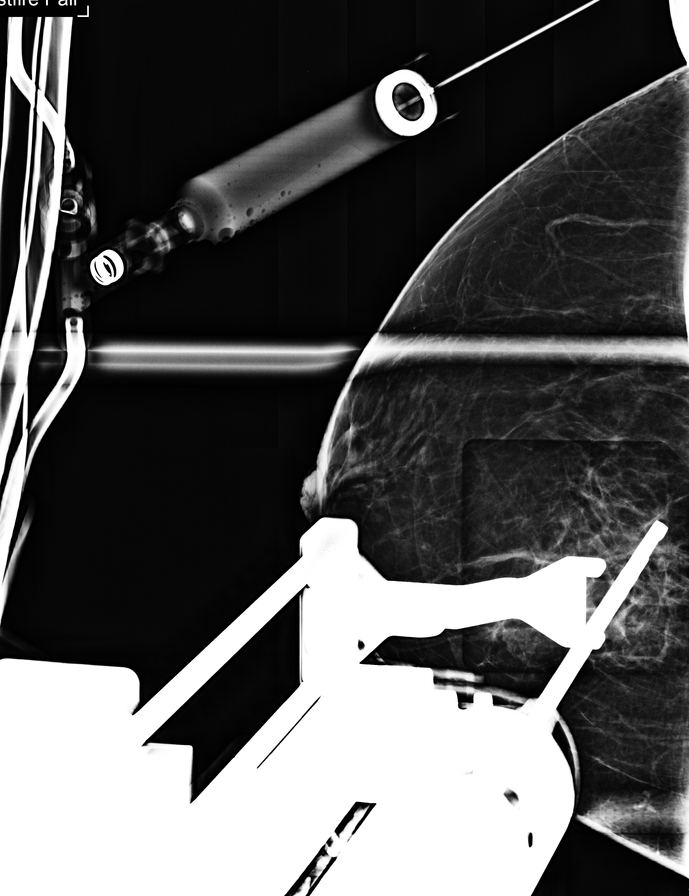

[R CC (6 of 6)]
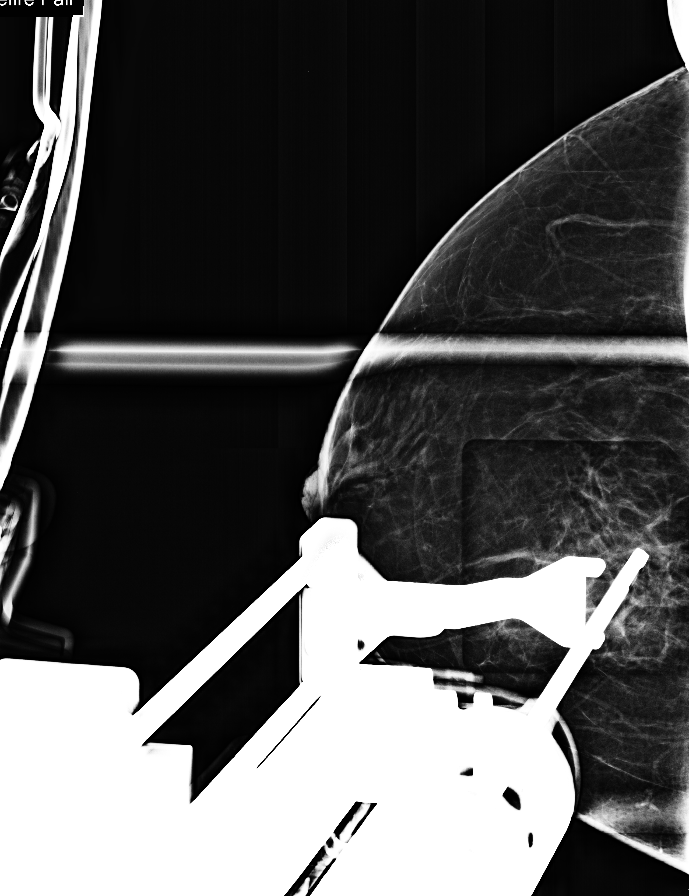

[R CC tomo · 2 of 67 frames shown]
[frame 22/67]
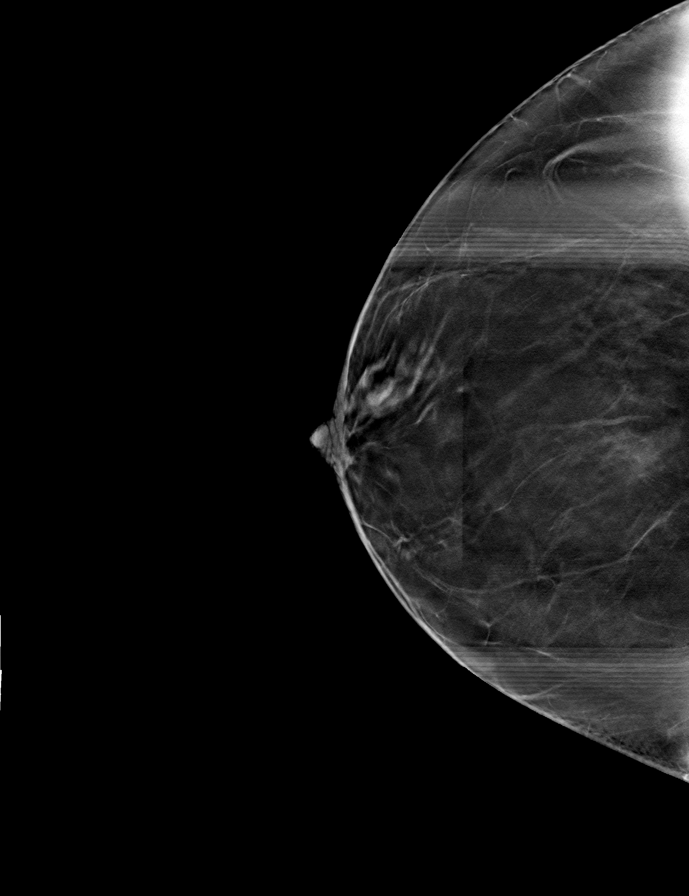
[frame 34/67]
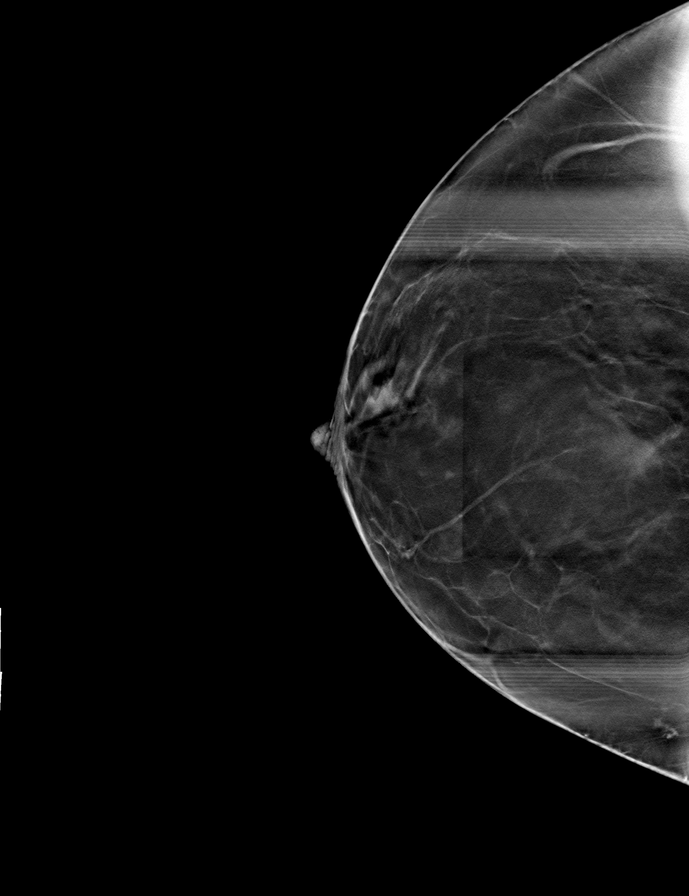

[8 of 11 positions shown; findings below may reference images not displayed]



Using sterile technique and 2% Lidocaine as local anesthetic, under
stereotactic guidance, a 9 gauge vacuum assisted device was used to
perform core needle biopsy of asymmetry in the upper inner quadrant
of the right breast using a cephalad approach.

At the conclusion of the procedure, a coil shaped tissue marker clip
was deployed into the biopsy cavity. Follow-up 2-view mammogram was
performed and dictated separately.
IMPRESSION: Stereotactic-guided biopsy of right breast density. No apparent
complications.

## 2015-03-19 IMAGING — MG MM DIAG BREAST TOMO UNI RIGHT
4 series · 4 of 12 positions shown · non-contrast
Comparison: Earlier exam

CLINICAL DATA: Status post stereotactic guided core biopsy of
density in the right breast.

EXAM:
DIAGNOSTIC RIGHT MAMMOGRAM POST STEREOTACTIC BIOPSY

[R ML]
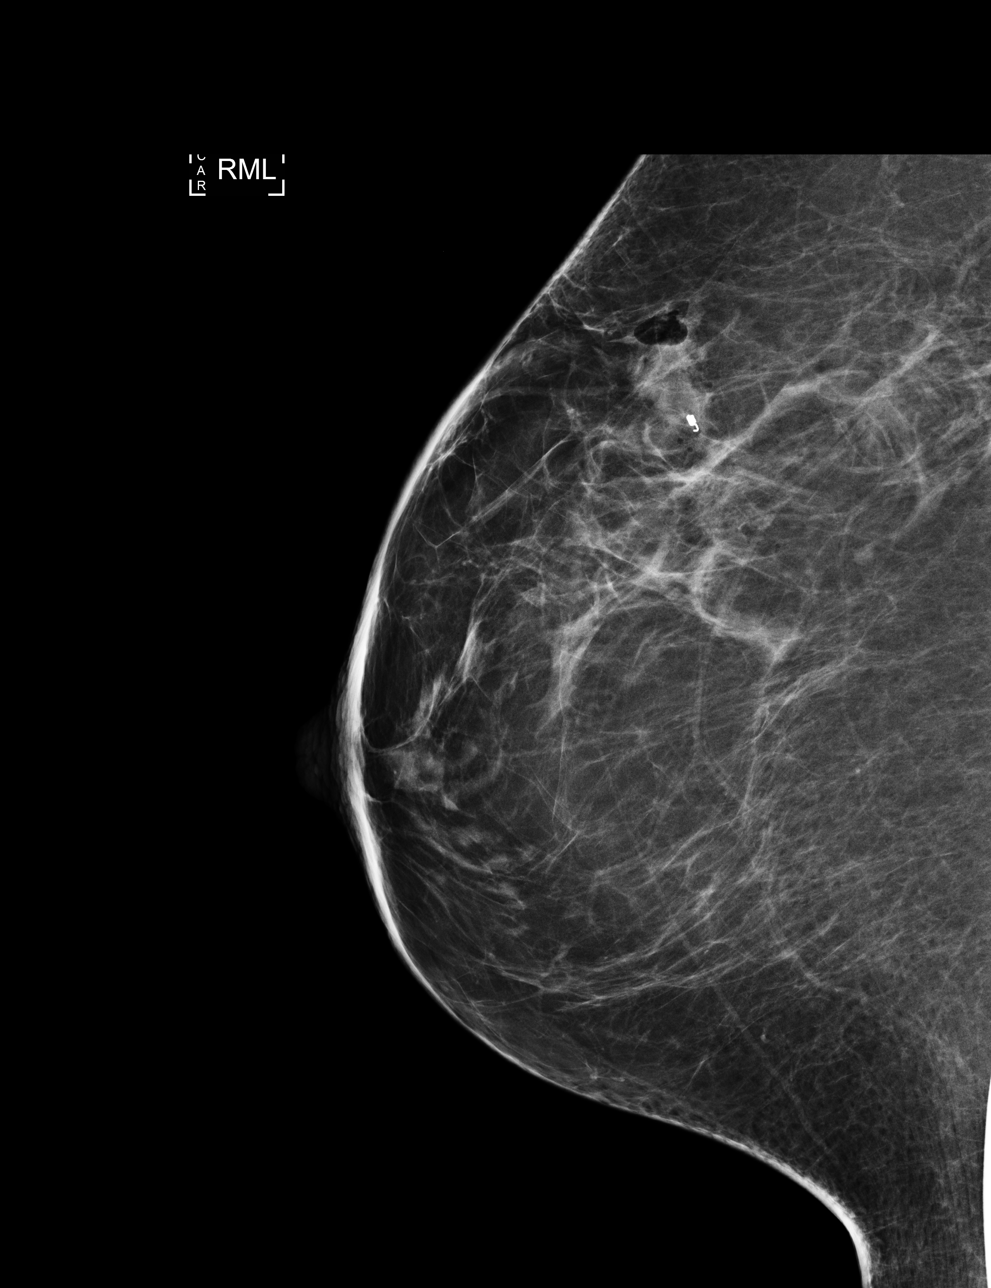

[R CC]
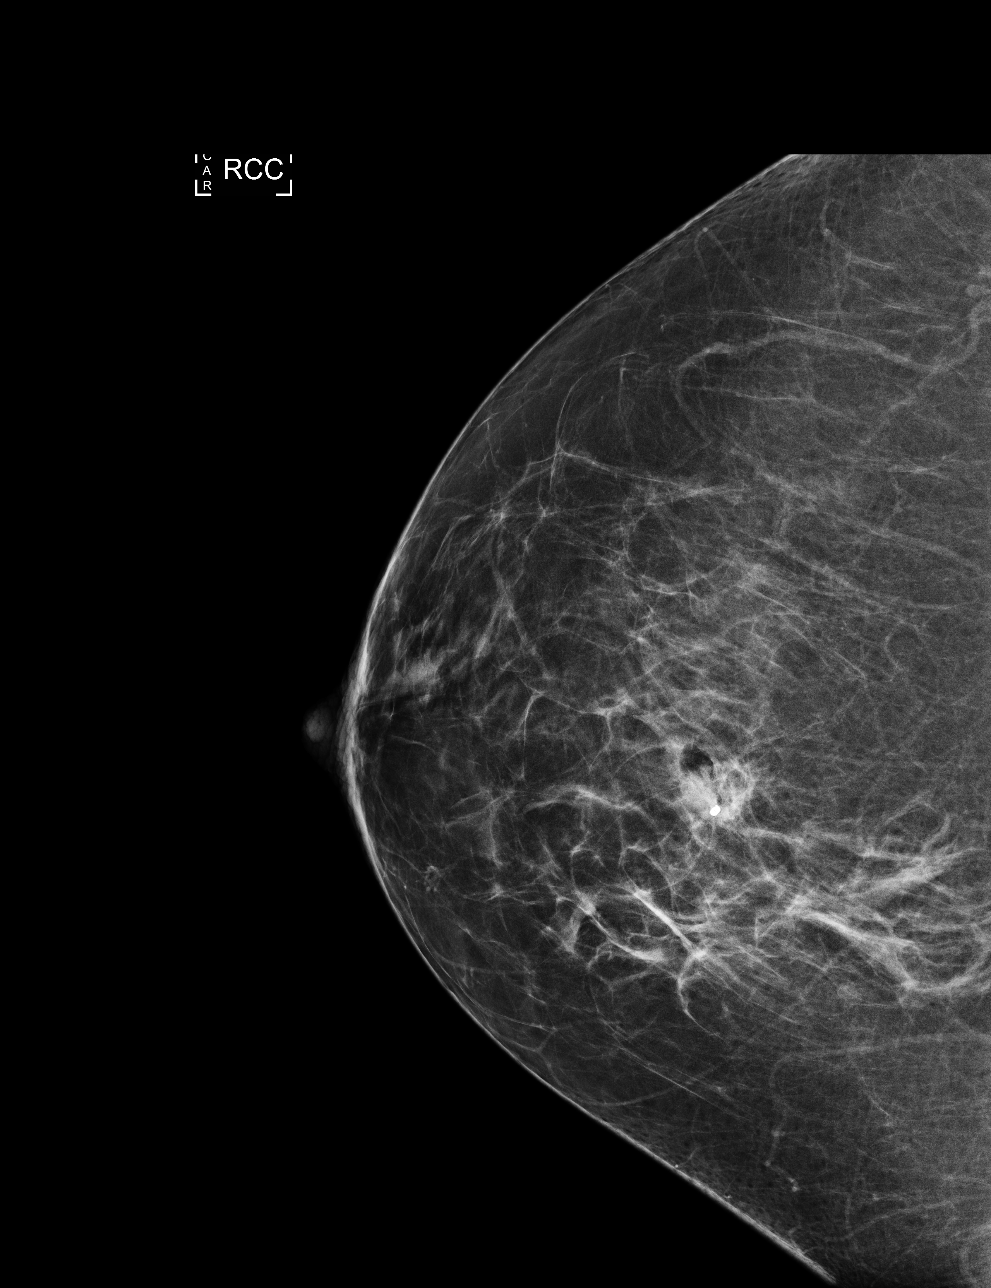

[R ML tomo · tomo slice 41/82.0]
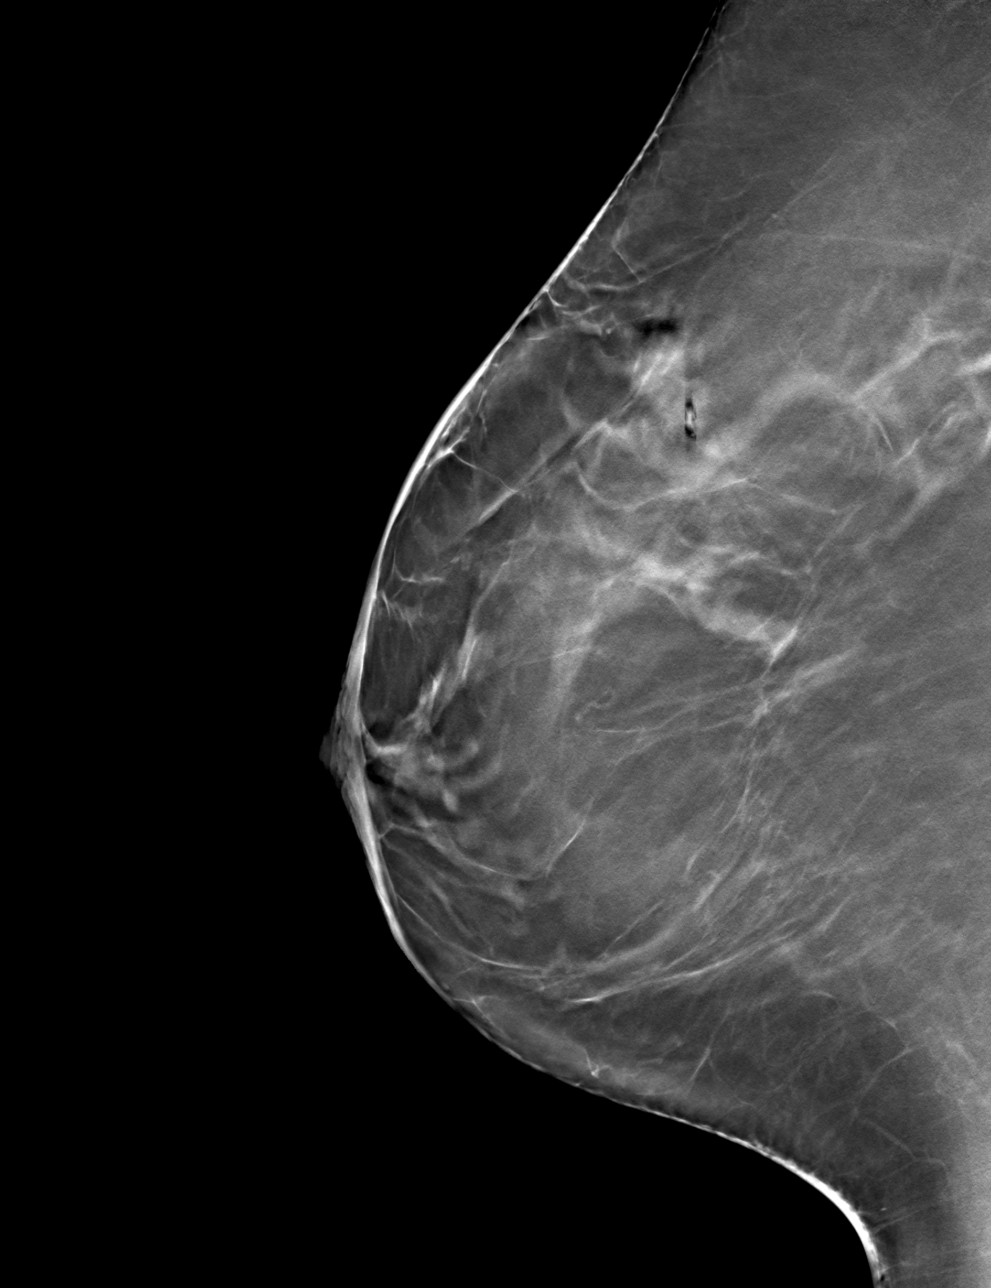

[R CC tomo · tomo slice 43/86.0]
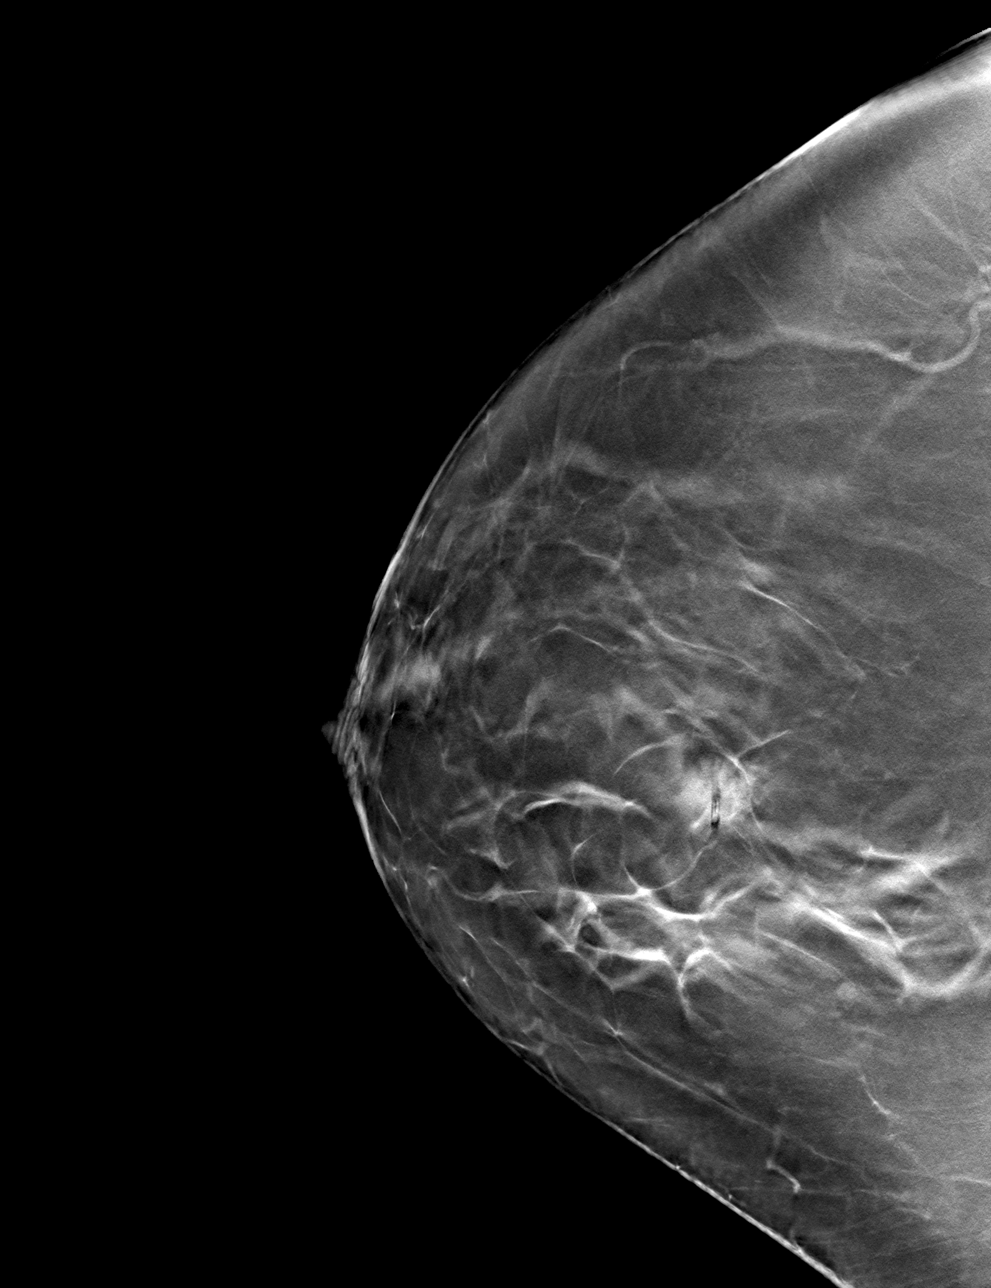

[4 of 12 positions shown; findings below may reference images not displayed]

FINDINGS: Mammographic images were obtained following stereotactic guided
biopsy of density in the upper inner quadrant of the right breast.
Coil shaped clip is identified in the upper inner quadrant as
expected.
IMPRESSION: Tissue marker clip is in expected location following biopsy.

Final Assessment: Post Procedure Mammograms for Marker Placement

## 2015-07-21 ENCOUNTER — Ambulatory Visit (INDEPENDENT_AMBULATORY_CARE_PROVIDER_SITE_OTHER): Payer: No Typology Code available for payment source | Admitting: Adult Health

## 2015-07-21 ENCOUNTER — Encounter: Payer: Self-pay | Admitting: Adult Health

## 2015-07-21 VITALS — BP 130/100 | Temp 98.3°F | Ht 67.0 in | Wt 240.7 lb

## 2015-07-21 DIAGNOSIS — F329 Major depressive disorder, single episode, unspecified: Secondary | ICD-10-CM

## 2015-07-21 DIAGNOSIS — F32A Depression, unspecified: Secondary | ICD-10-CM

## 2015-07-21 DIAGNOSIS — Z7189 Other specified counseling: Secondary | ICD-10-CM

## 2015-07-21 DIAGNOSIS — E669 Obesity, unspecified: Secondary | ICD-10-CM | POA: Diagnosis not present

## 2015-07-21 DIAGNOSIS — F17200 Nicotine dependence, unspecified, uncomplicated: Secondary | ICD-10-CM

## 2015-07-21 DIAGNOSIS — F419 Anxiety disorder, unspecified: Secondary | ICD-10-CM

## 2015-07-21 DIAGNOSIS — Z7689 Persons encountering health services in other specified circumstances: Secondary | ICD-10-CM

## 2015-07-21 MED ORDER — BUPROPION HCL ER (XL) 300 MG PO TB24: 300.0000 mg | ORAL_TABLET | Freq: Every day | ORAL | Status: DC

## 2015-07-21 NOTE — Progress Notes (Signed)
HPI:  Holly Horn is here to establish care. She is a pleasant Caucasian female who  has a past medical history of GERD (gastroesophageal reflux disease) and Ectopic pregnancy.  Last PCP and physical: 06/2014 - NP Holly Horn  Has the following chronic problems that require follow up and concerns today:  Depression - She's been struggling with depression since April 2016 when her brother killed himself. She was living with her brother at the time that this happened. He continues to have a hard time dealing with this incident. She is currently seeing a therapist that she was provided through hospice. Additionally her GYN had started the patient on Wellbutrin 150 mg 24-hour tablet. Holly Horn feels like the Wellbutrin isn't working very well to help her with her depression. She continues to grieve the loss of her brother, she is working 2 jobs- sometimes 16 hours per day. She denies taking any time for herself or doing anything for herself " while not doing anything my minds constantly running and I get upset about what happened my brother". She has not been exercising and has been gaining weight, she is not eating healthy as she endorses that she might not eat much throughout the day but then when she gets home from work she eats her meals and that's usually late at night. She denies any suicidal ideation at this time.  Tobacco use - She continues to smoke every day. She endorses that she smokes in the morning when she has a drink at night. Would like to quit but states "now is not the right time".  ROS negative for unless reported above: fevers, chills,feeling poorly, unintentional weight loss, hearing or vision loss, chest pain, palpitations, leg claudication, struggling to breath,Not feeling congested in the chest, no orthopenia, no cough,no wheezing, normal appetite, no soft tissue swelling, no hemoptysis, melena, hematochezia, hematuria, falls, loc, si, or thoughts of self harm.  Immunizations:  Does not want flu Diet: Does not eat healthy Exercise: Does not exercise Pap Smear: July 2016 - Normal Mammogram: September - had biopsy done, which was negative.   Past Medical History  Diagnosis Date  . GERD (gastroesophageal reflux disease)   . Ectopic pregnancy     x 3    Past Surgical History  Procedure Laterality Date  . Ectopic pregnancy surgery    . 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 Hypertension Mother   . Hypertension Brother   . Osteoarthritis Father   . Breast cancer Other   . Cancer Maternal Grandmother     unknown  . Cancer Maternal Grandfather     unknown  . Cancer Paternal Grandmother     unknown  . Cancer Paternal Grandfather     unknowon    Social History   Social History  . Marital Status: Single    Spouse Name: N/A  . Number of Children: N/A  . Years of Education: N/A   Social History Main Topics  . Smoking status: Current Every Day Smoker  . Smokeless tobacco: None  . Alcohol Use: 0.0 oz/week    0 Standard drinks or equivalent per week     Comment: once or twice a week  . Drug Use: No  . Sexual Activity: Not Asked   Other Topics Concern  . None   Social History Narrative     Current outpatient prescriptions:  .  buPROPion (WELLBUTRIN XL) 300 MG 24 hr tablet,  Take 1 tablet (300 mg total) by mouth daily., Disp: 30 tablet, Rfl: 3  EXAM:  Filed Vitals:   07/21/15 1306  BP: 130/100  Temp: 98.3 F (36.8 C)    Body mass index is 37.69 kg/(m^2).  GENERAL: vitals reviewed and listed above, alert, oriented, appears well hydrated and in no acute distress. She is tearful through much of the exam. She is obese  HEENT: atraumatic, conjunttiva clear, no obvious abnormalities on inspection of external nose and ears.  NECK: Neck is soft and supple without masses, no adenopathy or thyromegaly, trachea midline, no JVD. Normal range of motion.   LUNGS: clear to auscultation bilaterally,  no wheezes, rales or rhonchi, good air movement  CV: Regular rate and rhythm, normal S1/S2, no audible murmurs, gallops, or rubs. No carotid bruit and no peripheral edema.   MS: moves all extremities without noticeable abnormality. No edema noted  Abd: soft/nontender/nondistended/normal bowel sounds   Skin: warm and dry, no rash   Extremities: No clubbing, cyanosis, or edema. Capillary refill is WNL. Pulses intact bilaterally in upper and lower extremities.   Neuro: CN II-XII intact, sensation and reflexes normal throughout, 5/5 muscle strength in bilateral upper and lower extremities. Normal finger to nose. Normal rapid alternating movements. Normal romberg. No pronator drift.   PSYCH: pleasant and cooperative. She is tearful through much of exam. Obvious depression.   ASSESSMENT AND PLAN:  1. Encounter to establish care -Follow-up in January for complete physical exam -Follow up sooner if needed -Encouraged her to chart eating healthy and exercising -Advised to quit smoking  2. TOBACCO ABUSE - buPROPion (WELLBUTRIN XL) 300 MG 24 hr tablet; Take 1 tablet (300 mg total) by mouth daily.  Dispense: 30 tablet; Refill: 3  3. OBESITY - She understands that she needs to work on diet and exercise in order to lose weight. -Will consider phentermine after she starts dieting and exercising  4. Depression - Advised to see a psychiatrist. List of local psychiatrists was given to the patient. -Continue seeing therapist was provided by hospice - buPROPion (WELLBUTRIN XL) 300 MG 24 hr tablet; Take 1 tablet (300 mg total) by mouth daily.  Dispense: 30 tablet; Refill: 3 -Go to the ER with any thoughts of suicide or self-harm -Follow-up in one month or sooner if needed   -We reviewed the PMH, PSH, FH, SH, Meds and Allergies. -We provided refills for any medications we will prescribe as needed. -We addressed current concerns per orders and patient instructions. -We have asked for records for  pertinent exams, studies, vaccines and notes from previous providers. -We have advised patient to follow up per instructions below.   -Patient advised to return or notify a provider immediately if symptoms worsen or persist or new concerns arise.    Dorothyann Peng, AGNP

## 2015-07-21 NOTE — Patient Instructions (Signed)
It was great meeting you today!  As we discussed, we have increased your Wellbutrin to 300mg  a day.   Work on exercise and diet as well as taking time for yourself.   Follow up with me in January for your physical exam. If you need anything in the meantime, please let me know.

## 2015-07-21 NOTE — Progress Notes (Deleted)
   Subjective:    Patient ID: Holly Horn, female    DOB: 06/15/1971, 44 y.o.   MRN: [id number]  HPI    Review of Systems     Objective:   Physical Exam        Assessment & Plan:

## 2015-07-21 NOTE — Progress Notes (Signed)
Pre visit review using our clinic review tool, if applicable. No additional management support is needed unless otherwise documented below in the visit note. 

## 2015-09-10 ENCOUNTER — Other Ambulatory Visit (HOSPITAL_COMMUNITY)
Admission: RE | Admit: 2015-09-10 | Discharge: 2015-09-10 | Disposition: A | Discharge status: Home / Self Care | Payer: Managed Care, Other (non HMO) | Source: Ambulatory Visit | Attending: Adult Health | Admitting: Adult Health

## 2015-09-10 ENCOUNTER — Encounter: Payer: Self-pay | Admitting: Adult Health

## 2015-09-10 ENCOUNTER — Ambulatory Visit (INDEPENDENT_AMBULATORY_CARE_PROVIDER_SITE_OTHER): Payer: Managed Care, Other (non HMO) | Admitting: Adult Health

## 2015-09-10 VITALS — Temp 98.5°F | Ht 67.0 in | Wt 240.7 lb

## 2015-09-10 DIAGNOSIS — Z0000 Encounter for general adult medical examination without abnormal findings: Secondary | ICD-10-CM

## 2015-09-10 DIAGNOSIS — Z113 Encounter for screening for infections with a predominantly sexual mode of transmission: Secondary | ICD-10-CM | POA: Diagnosis not present

## 2015-09-10 DIAGNOSIS — F17200 Nicotine dependence, unspecified, uncomplicated: Secondary | ICD-10-CM

## 2015-09-10 DIAGNOSIS — F32A Depression, unspecified: Secondary | ICD-10-CM

## 2015-09-10 DIAGNOSIS — F329 Major depressive disorder, single episode, unspecified: Secondary | ICD-10-CM

## 2015-09-10 DIAGNOSIS — N760 Acute vaginitis: Secondary | ICD-10-CM | POA: Diagnosis present

## 2015-09-10 DIAGNOSIS — E669 Obesity, unspecified: Secondary | ICD-10-CM | POA: Diagnosis not present

## 2015-09-10 LAB — POCT URINALYSIS DIPSTICK
Bilirubin, UA: NEGATIVE
Blood, UA: NEGATIVE
GLUCOSE UA: NEGATIVE
Ketones, UA: NEGATIVE
Leukocytes, UA: NEGATIVE
NITRITE UA: NEGATIVE
PROTEIN UA: NEGATIVE
Spec Grav, UA: 1.025
UROBILINOGEN UA: 0.2
pH, UA: 6

## 2015-09-10 LAB — BASIC METABOLIC PANEL
BUN: 14 mg/dL (ref 6–23)
CALCIUM: 9.3 mg/dL (ref 8.4–10.5)
CO2: 25 mEq/L (ref 19–32)
Chloride: 104 mEq/L (ref 96–112)
Creatinine, Ser: 0.73 mg/dL (ref 0.40–1.20)
GFR: 91.41 mL/min (ref >60.00)
GLUCOSE: 91 mg/dL (ref 70–99)
Potassium: 4.6 mEq/L (ref 3.5–5.1)
Sodium: 138 mEq/L (ref 135–145)

## 2015-09-10 LAB — CBC WITH DIFFERENTIAL/PLATELET
BASOS PCT: 0.4 % (ref 0.0–3.0)
Basophils Absolute: 0 10*3/uL (ref 0.0–0.1)
EOS ABS: 0.1 10*3/uL (ref 0.0–0.7)
EOS PCT: 1.1 % (ref 0.0–5.0)
HEMATOCRIT: 39.1 % (ref 36.0–46.0)
HEMOGLOBIN: 12.9 g/dL (ref 12.0–15.0)
LYMPHS PCT: 34.5 % (ref 12.0–46.0)
Lymphs Abs: 3 10*3/uL (ref 0.7–4.0)
MCHC: 32.9 g/dL (ref 30.0–36.0)
MCV: 89.3 fl (ref 78.0–100.0)
MONO ABS: 0.5 10*3/uL (ref 0.1–1.0)
Monocytes Relative: 5.5 % (ref 3.0–12.0)
Neutro Abs: 5 10*3/uL (ref 1.4–7.7)
Neutrophils Relative %: 58.5 % (ref 43.0–77.0)
Platelets: 372 10*3/uL (ref 150.0–400.0)
RBC: 4.38 Mil/uL (ref 3.87–5.11)
RDW: 13.7 % (ref 11.5–15.5)
WBC: 8.6 10*3/uL (ref 4.0–10.5)

## 2015-09-10 LAB — LIPID PANEL
CHOLESTEROL: 201 mg/dL — AB (ref 0–200)
HDL: 68.7 mg/dL (ref >39.00)
LDL Cholesterol: 114 mg/dL — ABNORMAL HIGH (ref 0–99)
NonHDL: 131.99
TRIGLYCERIDES: 91 mg/dL (ref 0.0–149.0)
Total CHOL/HDL Ratio: 3
VLDL: 18.2 mg/dL (ref 0.0–40.0)

## 2015-09-10 LAB — TSH: TSH: 1.14 u[IU]/mL (ref 0.35–4.50)

## 2015-09-10 LAB — HEPATIC FUNCTION PANEL
ALBUMIN: 4.1 g/dL (ref 3.5–5.2)
ALT: 24 U/L (ref 0–35)
AST: 20 U/L (ref 0–37)
Alkaline Phosphatase: 95 U/L (ref 39–117)
Bilirubin, Direct: 0.1 mg/dL (ref 0.0–0.3)
TOTAL PROTEIN: 7.2 g/dL (ref 6.0–8.3)
Total Bilirubin: 0.4 mg/dL (ref 0.2–1.2)

## 2015-09-10 LAB — HEMOGLOBIN A1C: Hgb A1c MFr Bld: 5.6 % (ref 4.6–6.5)

## 2015-09-10 NOTE — Progress Notes (Signed)
Subjective:    Patient ID: Holly Horn, female    DOB: 1971-05-14, 45 y.o.   MRN: [id number]  HPI  Patient presents for yearly preventative medicine examination. She is a pleasant caucasian female who  has a past medical history of GERD (gastroesophageal reflux disease) and Ectopic pregnancy.  All immunizations and health maintenance protocols were reviewed with the patient and needed orders were placed.  Appropriate screening laboratory values were ordered for the patient including screening of hyperlipidemia, renal function and hepatic function.  Medication reconciliation,  past medical history, social history, problem list and allergies were reviewed in detail with the patient  Goals were established with regard to weight loss, exercise, and  diet in compliance with medications. She reports that she has joined a gym but has not started going yet. Her diet has not changed and she is not eating healthy   Unfortunately, she continues to smoke a few cigarettes per week. She does not feel like the Wellbutrin is helping her with her cravings.   She is continuing to see a therapist through Hospice after her brothers untimely death. She does not feel like her depression has gotten any better since being on Wellbutrin. She continues to feel sad. She has not made an appointment with a psychiatrist yet, but plans to do so after she gets some extra money. She denies any SI or self harm  She has had a GYN exam. In August 2016 during her mammogram there was found to be a " small ill-defined low- density lesion within the right breast" . 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 She is to have a repeat diagnostic mammogram this month  She does report that she has had a sinus infection for the last two weeks. She recently started Zyrtec which has helped and she is starting to feel better.    Review of  Systems  Constitutional: Negative.   HENT: Positive for congestion (nasal) and sinus pressure. Negative for postnasal drip, rhinorrhea and sore throat.   Eyes: Negative.   Respiratory: Negative.   Cardiovascular: Negative.   Gastrointestinal: Negative.   Endocrine: Negative.   Genitourinary: Negative.   Musculoskeletal: Negative.   Skin: Negative.   Allergic/Immunologic: Negative.   Neurological: Negative.   Hematological: Negative.   Psychiatric/Behavioral: Negative.        Depressed  All other systems reviewed and are negative.  Past Medical History  Diagnosis Date  . GERD (gastroesophageal reflux disease)   . Ectopic pregnancy     x 3    Social History   Social History  . Marital Status: Single    Spouse Name: N/A  . Number of Children: N/A  . Years of Education: N/A   Occupational History  . Not on file.   Social History Main Topics  . Smoking status: Current Every Day Smoker  . Smokeless tobacco: Not on file  . Alcohol Use: 0.0 oz/week    0 Standard drinks or equivalent per week     Comment: once or twice a week  . Drug Use: No  . Sexual Activity: Not on file   Other Topics Concern  . Not on file   Social History Narrative    Past Surgical History  Procedure Laterality Date  . Ectopic pregnancy surgery    . 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 Hypertension  Mother   . Hypertension Brother   . Osteoarthritis Father   . Breast cancer Other   . Cancer Maternal Grandmother     unknown  . Cancer Maternal Grandfather     unknown  . Cancer Paternal Grandmother     unknown  . Cancer Paternal Grandfather     unknowon    Allergies  Allergen Reactions  . Morphine Sulfate     REACTION: vomiting, SOB    Current Outpatient Prescriptions on File Prior to Visit  Medication Sig Dispense Refill  . buPROPion (WELLBUTRIN XL) 300 MG 24 hr tablet Take 1 tablet (300 mg total) by mouth daily. 30 tablet 3    No current facility-administered medications on file prior to visit.    Temp(Src) 98.5 F (36.9 C) (Oral)  Ht 5\' 7"  (1.702 m)  Wt 240 lb 11.2 oz (109.181 kg)  BMI 37.69 kg/m2       Objective:   Physical Exam  Constitutional: She is oriented to person, place, and time. She appears well-developed and well-nourished. No distress.  HENT:  Head: Normocephalic and atraumatic.  Right Ear: External ear normal.  Left Ear: External ear normal.  Nose: Nose normal.  Mouth/Throat: Oropharynx is clear and moist. No oropharyngeal exudate.  Eyes: Conjunctivae and EOM are normal. Pupils are equal, round, and reactive to light. Right eye exhibits no discharge. Left eye exhibits no discharge. No scleral icterus.  Neck: Normal range of motion. Neck supple. No JVD present. No tracheal deviation present. No thyromegaly present.  Cardiovascular: Normal rate, regular rhythm, normal heart sounds and intact distal pulses.  Exam reveals no gallop and no friction rub.   No murmur heard. Pulmonary/Chest: Effort normal and breath sounds normal. No stridor. No respiratory distress. She has no wheezes. She has no rales. She exhibits no tenderness.  Abdominal: Soft. Bowel sounds are normal. She exhibits no distension and no mass. There is no tenderness. There is no rebound and no guarding.  obese  Genitourinary:  Refused: Breast and Pelvic exam  Musculoskeletal: Normal range of motion. She exhibits no edema or tenderness.  Lymphadenopathy:    She has no cervical adenopathy.  Neurological: She is alert and oriented to person, place, and time. No cranial nerve deficit. Coordination normal.  Skin: Skin is warm and dry. No rash noted. She is not diaphoretic. No erythema. No pallor.  Psychiatric: She has a normal mood and affect. Her behavior is normal. Judgment and thought content normal.  Nursing note and vitals reviewed.      Assessment & Plan:  1. Routine general medical examination at a health care  facility - Basic metabolic panel - Hemoglobin A1c - Hepatic function panel - Lipid panel - TSH - POCT urinalysis dipstick - CBC with Differential/Platelet - Urine cytology ancillary only - HIV antibody - RPR - EKG 12-Lead- NSR, rate 75  2. Depression - Currently on Wellbutrin. She was on another anti depression medication in the past but does not know what it is. She will call back to the office with the name of the medication. Ideally, I would like her to see psychiatry. Will keep her on Wellbutrin for now and consider changing medication in the near future.   3. OBESITY - Stressed the importance of diet and exercise. She needs to start eating healthier and exercise.   4. TOBACCO ABUSE - Does not want any help with quitting. She will let me know when/if she does.

## 2015-09-10 NOTE — Patient Instructions (Addendum)
It was great seeing you again!  I will follow up with you regarding your blood work.   You have joined that gym, start working out and eating healthy.   Follow up with me in one year for your next physical or sooner if needed.     Health Maintenance, Female Adopting a healthy lifestyle and getting preventive care can go a long way to promote health and wellness. Talk with your health care provider about what schedule of regular examinations is right for you. This is a good chance for you to check in with your provider about disease prevention and staying healthy. In between checkups, there are plenty of things you can do on your own. Experts have done a lot of research about which lifestyle changes and preventive measures are most likely to keep you healthy. Ask your health care provider for more information. WEIGHT AND DIET  Eat a healthy diet  Be sure to include plenty of vegetables, fruits, low-fat dairy products, and lean protein.  Do not eat a lot of foods high in solid fats, added sugars, or salt.  Get regular exercise. This is one of the most important things you can do for your health.  Most adults should exercise for at least 150 minutes each week. The exercise should increase your heart rate and make you sweat (moderate-intensity exercise).  Most adults should also do strengthening exercises at least twice a week. This is in addition to the moderate-intensity exercise.  Maintain a healthy weight  Body mass index (BMI) is a measurement that can be used to identify possible weight problems. It estimates body fat based on height and weight. Your health care provider can help determine your BMI and help you achieve or maintain a healthy weight.  For females 53 years of age and older:   A BMI below 18.5 is considered underweight.  A BMI of 18.5 to 24.9 is normal.  A BMI of 25 to 29.9 is considered overweight.  A BMI of 30 and above is considered obese.  Watch levels of  cholesterol and blood lipids  You should start having your blood tested for lipids and cholesterol at 46 years of age, then have this test every 5 years.  You may need to have your cholesterol levels checked more often if:  Your lipid or cholesterol levels are high.  You are older than 46 years of age.  You are at high risk for heart disease.  CANCER SCREENING   Lung Cancer  Lung cancer screening is recommended for adults 23-61 years old who are at high risk for lung cancer because of a history of smoking.  A yearly low-dose CT scan of the lungs is recommended for people who:  Currently smoke.  Have quit within the past 15 years.  Have at least a 30-pack-year history of smoking. A pack year is smoking an average of one pack of cigarettes a day for 1 year.  Yearly screening should continue until it has been 15 years since you quit.  Yearly screening should stop if you develop a health problem that would prevent you from having lung cancer treatment.  Breast Cancer  Practice breast self-awareness. This means understanding how your breasts normally appear and feel.  It also means doing regular breast self-exams. Let your health care provider know about any changes, no matter how small.  If you are in your 20s or 30s, you should have a clinical breast exam (CBE) by a health care provider every 1-3  years as part of a regular health exam.  If you are 52 or older, have a CBE every year. Also consider having a breast X-ray (mammogram) every year.  If you have a family history of breast cancer, talk to your health care provider about genetic screening.  If you are at high risk for breast cancer, talk to your health care provider about having an MRI and a mammogram every year.  Breast cancer gene (BRCA) assessment is recommended for women who have family members with BRCA-related cancers. BRCA-related cancers include:  Breast.  Ovarian.  Tubal.  Peritoneal  cancers.  Results of the assessment will determine the need for genetic counseling and BRCA1 and BRCA2 testing. Cervical Cancer Your health care provider may recommend that you be screened regularly for cancer of the pelvic organs (ovaries, uterus, and vagina). This screening involves a pelvic examination, including checking for microscopic changes to the surface of your cervix (Pap test). You may be encouraged to have this screening done every 3 years, beginning at age 84.  For women ages 52-65, health care providers may recommend pelvic exams and Pap testing every 3 years, or they may recommend the Pap and pelvic exam, combined with testing for human papilloma virus (HPV), every 5 years. Some types of HPV increase your risk of cervical cancer. Testing for HPV may also be done on women of any age with unclear Pap test results.  Other health care providers may not recommend any screening for nonpregnant women who are considered low risk for pelvic cancer and who do not have symptoms. Ask your health care provider if a screening pelvic exam is right for you.  If you have had past treatment for cervical cancer or a condition that could lead to cancer, you need Pap tests and screening for cancer for at least 20 years after your treatment. If Pap tests have been discontinued, your risk factors (such as having a new sexual partner) need to be reassessed to determine if screening should resume. Some women have medical problems that increase the chance of getting cervical cancer. In these cases, your health care provider may recommend more frequent screening and Pap tests. Colorectal Cancer  This type of cancer can be detected and often prevented.  Routine colorectal cancer screening usually begins at 46 years of age and continues through 46 years of age.  Your health care provider may recommend screening at an earlier age if you have risk factors for colon cancer.  Your health care provider may also  recommend using home test kits to check for hidden blood in the stool.  A small camera at the end of a tube can be used to examine your colon directly (sigmoidoscopy or colonoscopy). This is done to check for the earliest forms of colorectal cancer.  Routine screening usually begins at age 31.  Direct examination of the colon should be repeated every 5-10 years through 46 years of age. However, you may need to be screened more often if early forms of precancerous polyps or small growths are found. Skin Cancer  Check your skin from head to toe regularly.  Tell your health care provider about any new moles or changes in moles, especially if there is a change in a mole's shape or color.  Also tell your health care provider if you have a mole that is larger than the size of a pencil eraser.  Always use sunscreen. Apply sunscreen liberally and repeatedly throughout the day.  Protect yourself by wearing long  sleeves, pants, a wide-brimmed hat, and sunglasses whenever you are outside. HEART DISEASE, DIABETES, AND HIGH BLOOD PRESSURE   High blood pressure causes heart disease and increases the risk of stroke. High blood pressure is more likely to develop in:  People who have blood pressure in the high end of the normal range (130-139/85-89 mm Hg).  People who are overweight or obese.  People who are African American.  If you are 44-28 years of age, have your blood pressure checked every 3-5 years. If you are 44 years of age or older, have your blood pressure checked every year. You should have your blood pressure measured twice--once when you are at a hospital or clinic, and once when you are not at a hospital or clinic. Record the average of the two measurements. To check your blood pressure when you are not at a hospital or clinic, you can use:  An automated blood pressure machine at a pharmacy.  A home blood pressure monitor.  If you are between 62 years and 107 years old, ask your health  care provider if you should take aspirin to prevent strokes.  Have regular diabetes screenings. This involves taking a blood sample to check your fasting blood sugar level.  If you are at a normal weight and have a low risk for diabetes, have this test once every three years after 46 years of age.  If you are overweight and have a high risk for diabetes, consider being tested at a younger age or more often. PREVENTING INFECTION  Hepatitis B  If you have a higher risk for hepatitis B, you should be screened for this virus. You are considered at high risk for hepatitis B if:  You were born in a country where hepatitis B is common. Ask your health care provider which countries are considered high risk.  Your parents were born in a high-risk country, and you have not been immunized against hepatitis B (hepatitis B vaccine).  You have HIV or AIDS.  You use needles to inject street drugs.  You live with someone who has hepatitis B.  You have had sex with someone who has hepatitis B.  You get hemodialysis treatment.  You take certain medicines for conditions, including cancer, organ transplantation, and autoimmune conditions. Hepatitis C  Blood testing is recommended for:  Everyone born from [birth year] through 1966.  Anyone with known risk factors for hepatitis C. Sexually transmitted infections (STIs)  You should be screened for sexually transmitted infections (STIs) including gonorrhea and chlamydia if:  You are sexually active and are younger than 46 years of age.  You are older than 46 years of age and your health care provider tells you that you are at risk for this type of infection.  Your sexual activity has changed since you were last screened and you are at an increased risk for chlamydia or gonorrhea. Ask your health care provider if you are at risk.  If you do not have HIV, but are at risk, it may be recommended that you take a prescription medicine daily to prevent HIV  infection. This is called pre-exposure prophylaxis (PrEP). You are considered at risk if:  You are sexually active and do not regularly use condoms or know the HIV status of your partner(s).  You take drugs by injection.  You are sexually active with a partner who has HIV. Talk with your health care provider about whether you are at high risk of being infected with HIV. If you choose  to begin PrEP, you should first be tested for HIV. You should then be tested every 3 months for as long as you are taking PrEP.  PREGNANCY   If you are premenopausal and you may become pregnant, ask your health care provider about preconception counseling.  If you may become pregnant, take 400 to 800 micrograms (mcg) of folic acid every day.  If you want to prevent pregnancy, talk to your health care provider about birth control (contraception). OSTEOPOROSIS AND MENOPAUSE   Osteoporosis is a disease in which the bones lose minerals and strength with aging. This can result in serious bone fractures. Your risk for osteoporosis can be identified using a bone density scan.  If you are 66 years of age or older, or if you are at risk for osteoporosis and fractures, ask your health care provider if you should be screened.  Ask your health care provider whether you should take a calcium or vitamin D supplement to lower your risk for osteoporosis.  Menopause may have certain physical symptoms and risks.  Hormone replacement therapy may reduce some of these symptoms and risks. Talk to your health care provider about whether hormone replacement therapy is right for you.  HOME CARE INSTRUCTIONS   Schedule regular health, dental, and eye exams.  Stay current with your immunizations.   Do not use any tobacco products including cigarettes, chewing tobacco, or electronic cigarettes.  If you are pregnant, do not drink alcohol.  If you are breastfeeding, limit how much and how often you drink alcohol.  Limit  alcohol intake to no more than 1 drink per day for nonpregnant women. One drink equals 12 ounces of beer, 5 ounces of wine, or 1 ounces of hard liquor.  Do not use street drugs.  Do not share needles.  Ask your health care provider for help if you need support or information about quitting drugs.  Tell your health care provider if you often feel depressed.  Tell your health care provider if you have ever been abused or do not feel safe at home.   This information is not intended to replace advice given to you by your health care provider. Make sure you discuss any questions you have with your health care provider.   Document Released: 02/08/2011 Document Revised: 08/16/2014 Document Reviewed: 06/27/2013 Elsevier Interactive Patient Education [employer].

## 2015-09-10 NOTE — Progress Notes (Signed)
Pre visit review using our clinic review tool, if applicable. No additional management support is needed unless otherwise documented below in the visit note. 

## 2015-09-11 ENCOUNTER — Telehealth: Payer: Self-pay

## 2015-09-11 LAB — URINE CYTOLOGY ANCILLARY ONLY
Chlamydia: NEGATIVE
Neisseria Gonorrhea: NEGATIVE
Trichomonas: NEGATIVE

## 2015-09-11 LAB — HIV ANTIBODY (ROUTINE TESTING W REFLEX): HIV 1&2 Ab, 4th Generation: NONREACTIVE

## 2015-09-11 LAB — SYPHILIS: RPR W/REFLEX TO RPR TITER AND TREPONEMAL ANTIBODIES, TRADITIONAL SCREENING AND DIAGNOSIS ALGORITHM

## 2015-09-11 NOTE — Telephone Encounter (Signed)
Pt states she spoke with Tommi Rumps about changing her depression medication.  Pt was given a medication in the past and she cannot take it.  The name of it is venlafaxine. Pls advise.

## 2015-09-15 MED ORDER — CITALOPRAM HYDROBROMIDE 10 MG PO TABS: 10.0000 mg | ORAL_TABLET | Freq: Every day | ORAL | Status: DC

## 2015-09-15 NOTE — Telephone Encounter (Signed)
We can add a low dose of Celexa if she would like to try that

## 2015-09-15 NOTE — Telephone Encounter (Signed)
Pt returned call; pt states she has never tried Celexa and would like that sent to [facility].  Rx sent.

## 2015-09-15 NOTE — Telephone Encounter (Signed)
Called and left a message for pt to return call.  

## 2015-09-22 ENCOUNTER — Other Ambulatory Visit: Payer: Self-pay | Admitting: Obstetrics and Gynecology

## 2015-09-22 DIAGNOSIS — N6489 Other specified disorders of breast: Secondary | ICD-10-CM

## 2015-10-22 ENCOUNTER — Ambulatory Visit
Admission: RE | Admit: 2015-10-22 | Discharge: 2015-10-22 | Disposition: A | Discharge status: Home / Self Care | Payer: Managed Care, Other (non HMO) | Source: Ambulatory Visit | Attending: Obstetrics and Gynecology | Admitting: Obstetrics and Gynecology

## 2015-10-22 DIAGNOSIS — N6489 Other specified disorders of breast: Secondary | ICD-10-CM

## 2015-10-22 IMAGING — MG MM DIAG BREAST TOMO UNI RIGHT
6 series · 6 of 14 positions shown · non-contrast
Comparison: [DATE], [DATE], [DATE], [DATE].

CLINICAL DATA: History of a benign, concordant right breast
tomosynthesis guided biopsy in [DATE]. Follow-up evaluation
requested.

EXAM:
DIGITAL DIAGNOSTIC RIGHT MAMMOGRAM WITH 3D TOMOSYNTHESIS AND CAD

[R CC synth-2D]
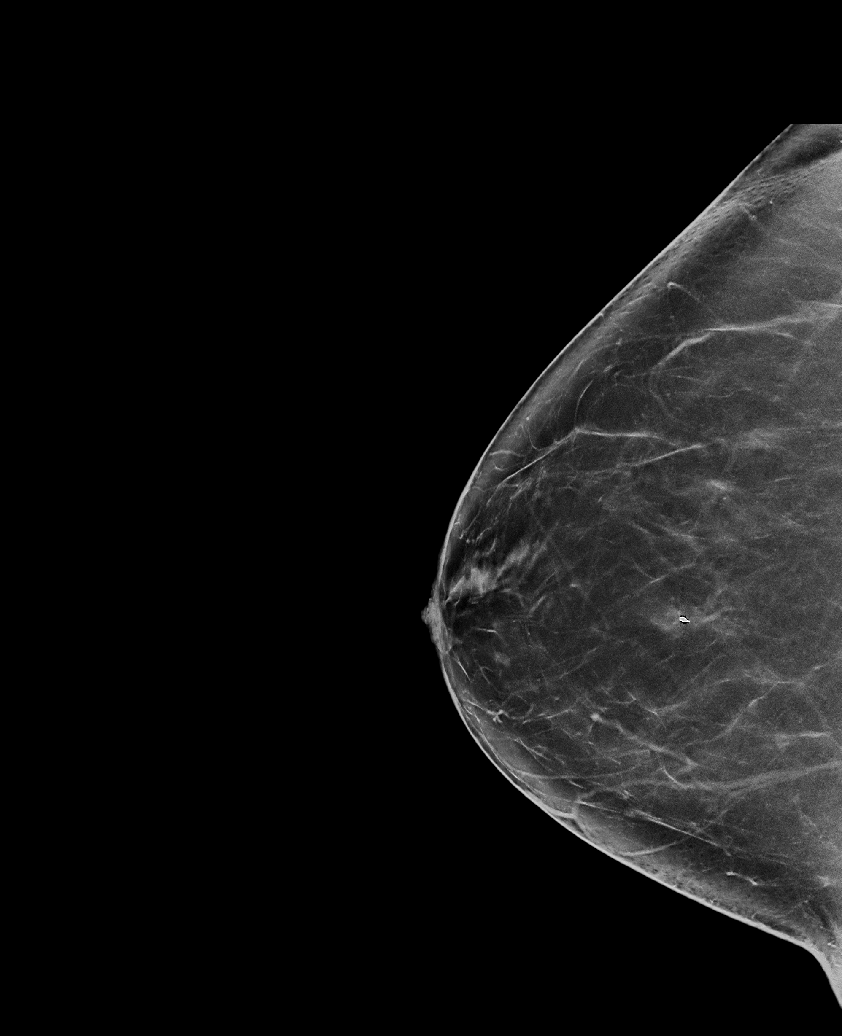

[R MLO]
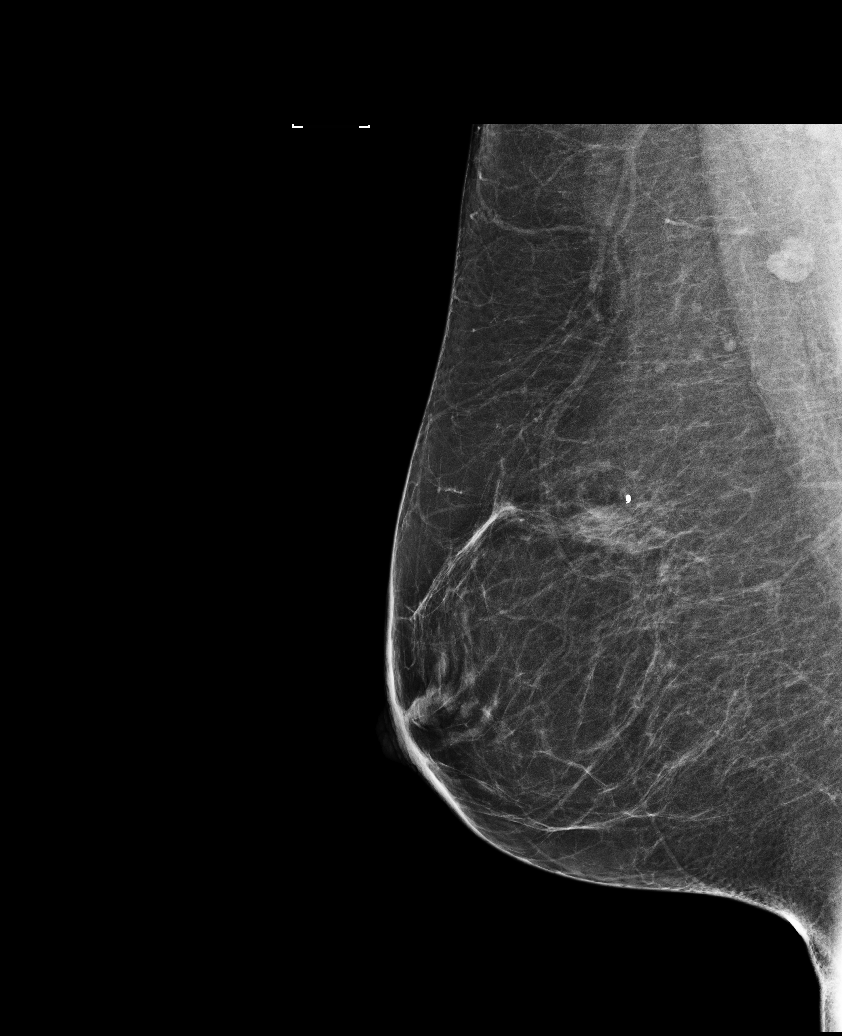

[R CC]
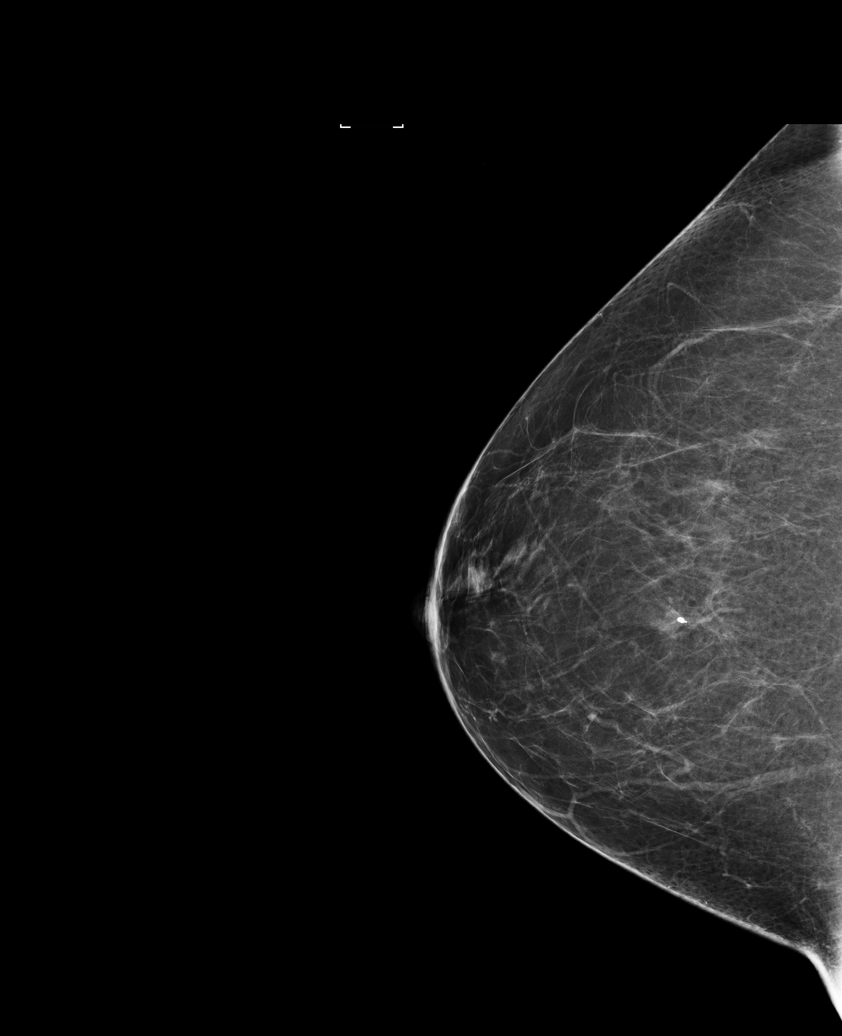

[R MLO synth-2D]
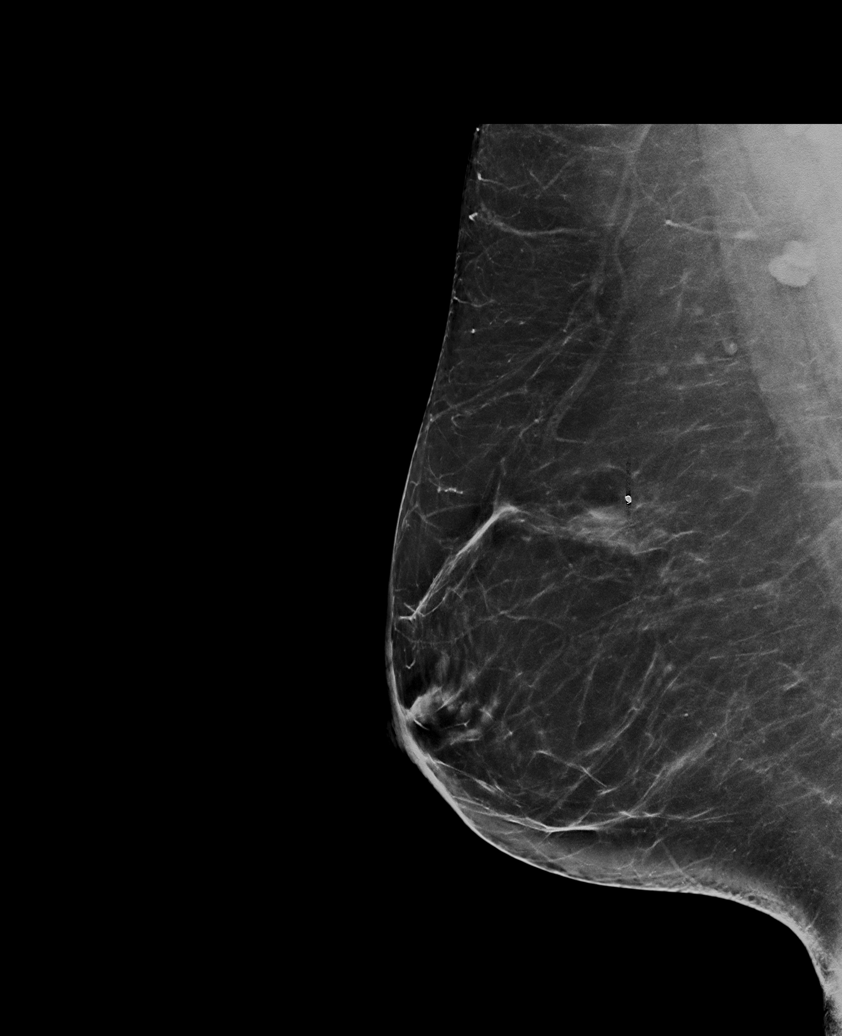

[R MLO tomo · tomo slice 40/79.0]
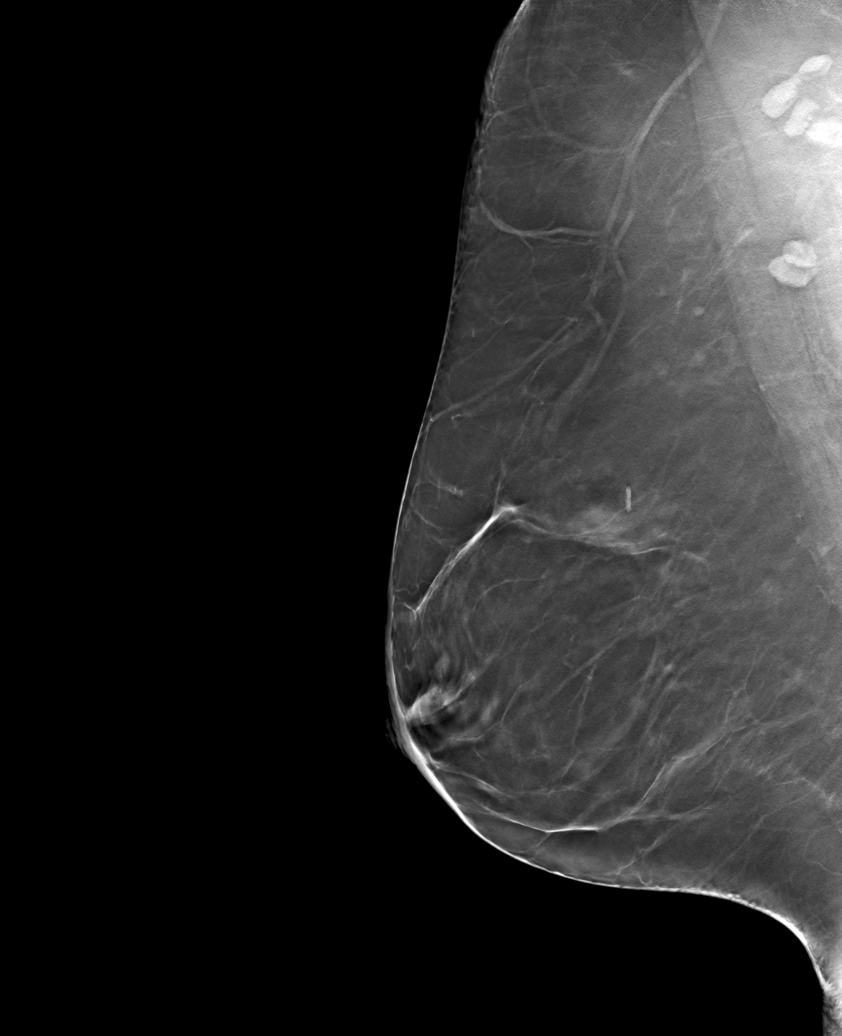

[R CC tomo · tomo slice 39/77.0]
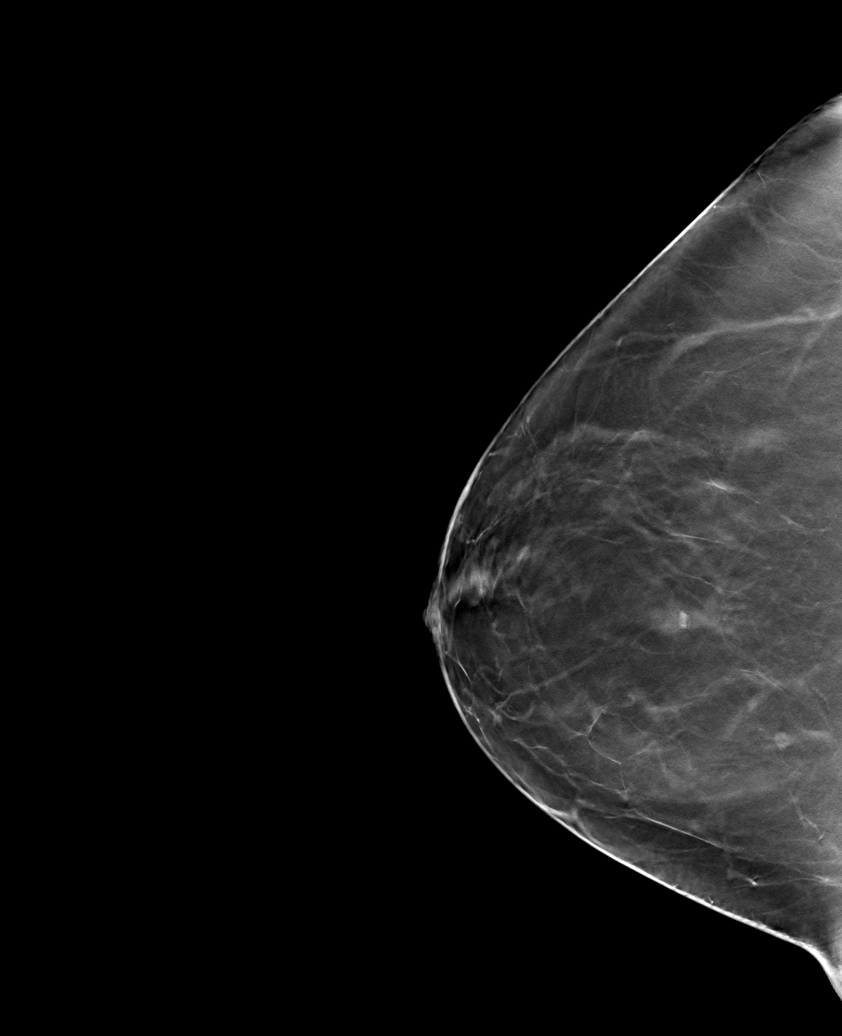

[6 of 14 positions shown; findings below may reference images not displayed]

ACR Breast Density Category b: There are scattered areas of
fibroglandular density.
FINDINGS: There are mild post biopsy scarring changes seen superiorly within
the right breast related to the patient's procedure. There is no
specific evidence for developing malignancy and the breast
parenchymal pattern is otherwise stable.

Mammographic images were processed with CAD.
IMPRESSION: No findings worrisome for developing malignancy.

RECOMMENDATION:
Bilateral screening mammography in [DATE].

I have discussed the findings and recommendations with the patient.
Results were also provided in writing at the conclusion of the
visit. If applicable, a reminder letter will be sent to the patient
regarding the next appointment.

BI-RADS CATEGORY  2: Benign.

## 2015-12-15 ENCOUNTER — Encounter: Payer: Self-pay | Admitting: Adult Health

## 2016-02-13 ENCOUNTER — Other Ambulatory Visit: Payer: Self-pay | Admitting: Adult Health

## 2016-02-13 NOTE — Telephone Encounter (Signed)
Left message for patient to return phone call.  

## 2016-02-13 NOTE — Telephone Encounter (Signed)
Can we make sure this dose is working for her? If it is, then 90 days with 1 refill

## 2016-02-13 NOTE — Telephone Encounter (Signed)
Ok to refill 

## 2016-02-17 NOTE — Telephone Encounter (Signed)
Patient states Rx is working great. Rx sent.

## 2016-03-04 ENCOUNTER — Other Ambulatory Visit: Payer: Self-pay | Admitting: Obstetrics and Gynecology

## 2016-03-04 DIAGNOSIS — Z1231 Encounter for screening mammogram for malignant neoplasm of breast: Secondary | ICD-10-CM

## 2016-03-17 ENCOUNTER — Ambulatory Visit
Admission: RE | Admit: 2016-03-17 | Discharge: 2016-03-17 | Disposition: A | Discharge status: Home / Self Care | Payer: Managed Care, Other (non HMO) | Source: Ambulatory Visit | Attending: Obstetrics and Gynecology | Admitting: Obstetrics and Gynecology

## 2016-03-17 DIAGNOSIS — Z1231 Encounter for screening mammogram for malignant neoplasm of breast: Secondary | ICD-10-CM

## 2016-03-17 IMAGING — MG 2D DIGITAL SCREENING BILATERAL MAMMOGRAM WITH CAD AND ADJUNCT TO
8 of 12 series · 8 of 28 positions shown · non-contrast
Comparison: Previous exam(s).

ACR Breast Density Category a: The breast tissue is almost entirely
fatty.

CLINICAL DATA: Screening.

EXAM:
2D DIGITAL SCREENING BILATERAL MAMMOGRAM WITH CAD AND ADJUNCT TOMO

[R MLO synth-2D]
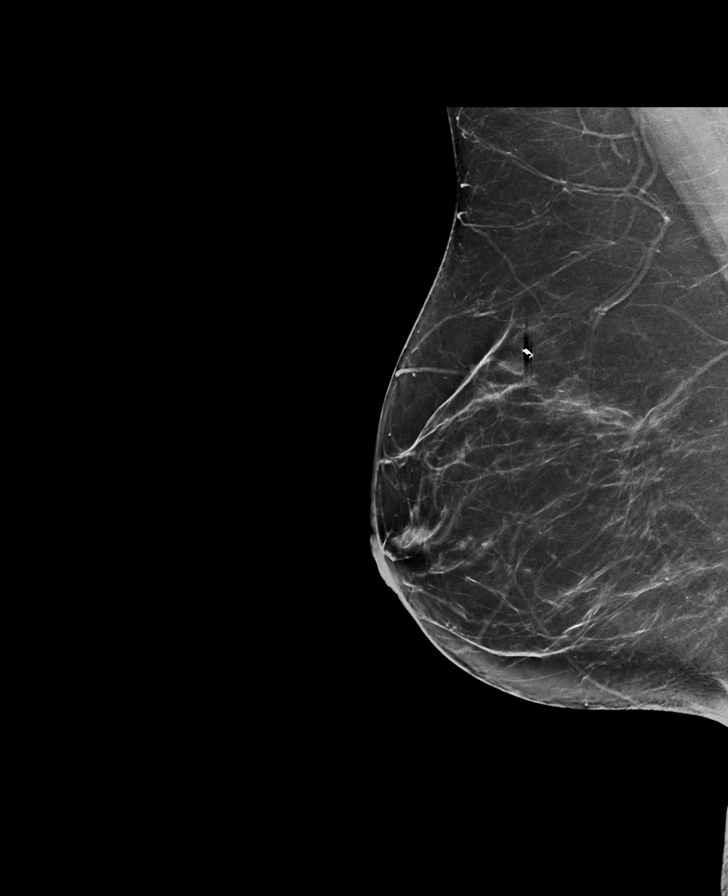

[L CC]
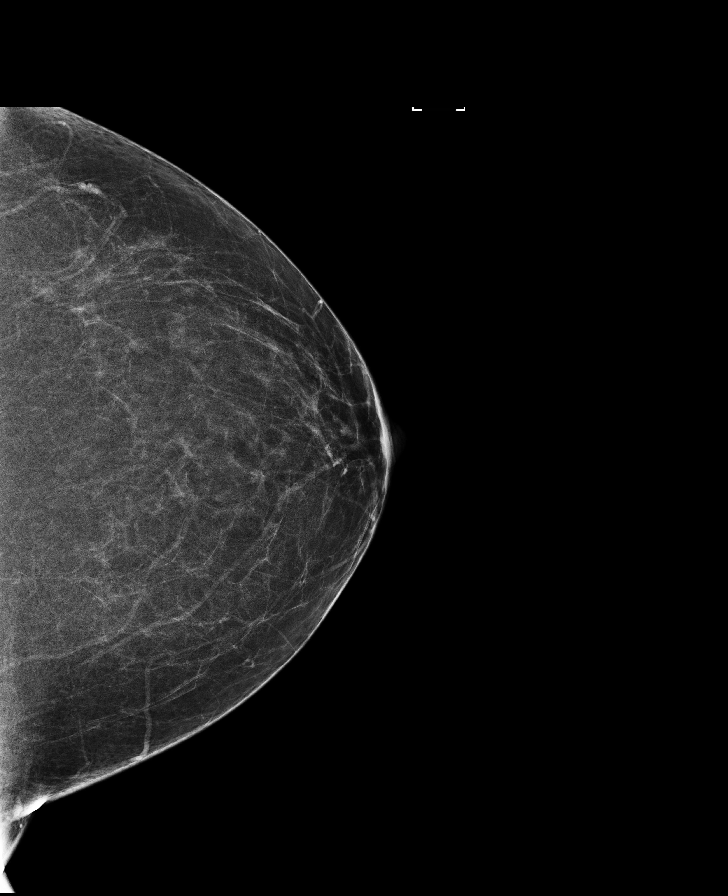

[L CC synth-2D]
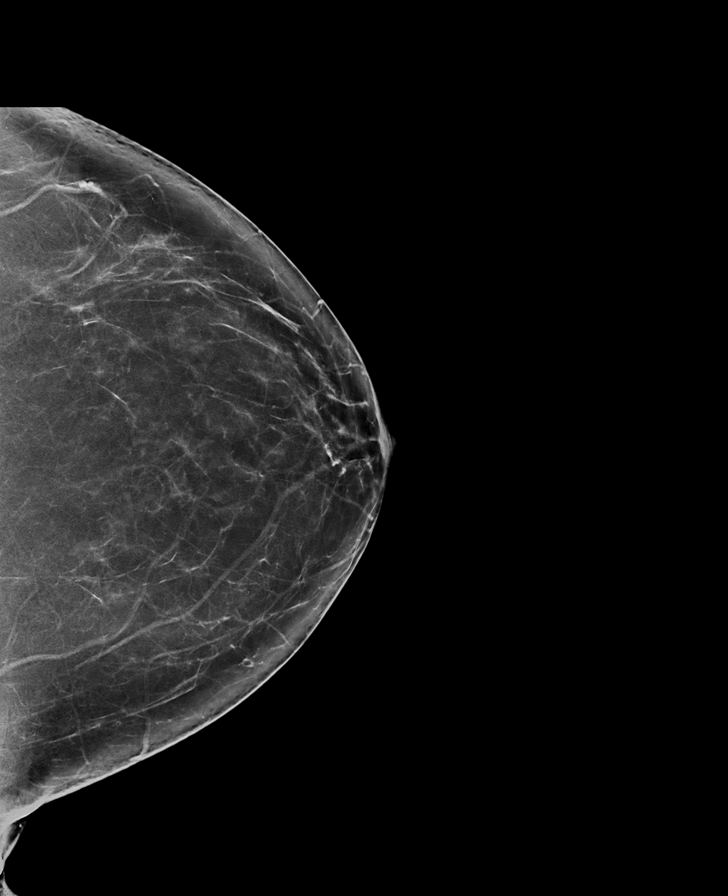

[R CC synth-2D]
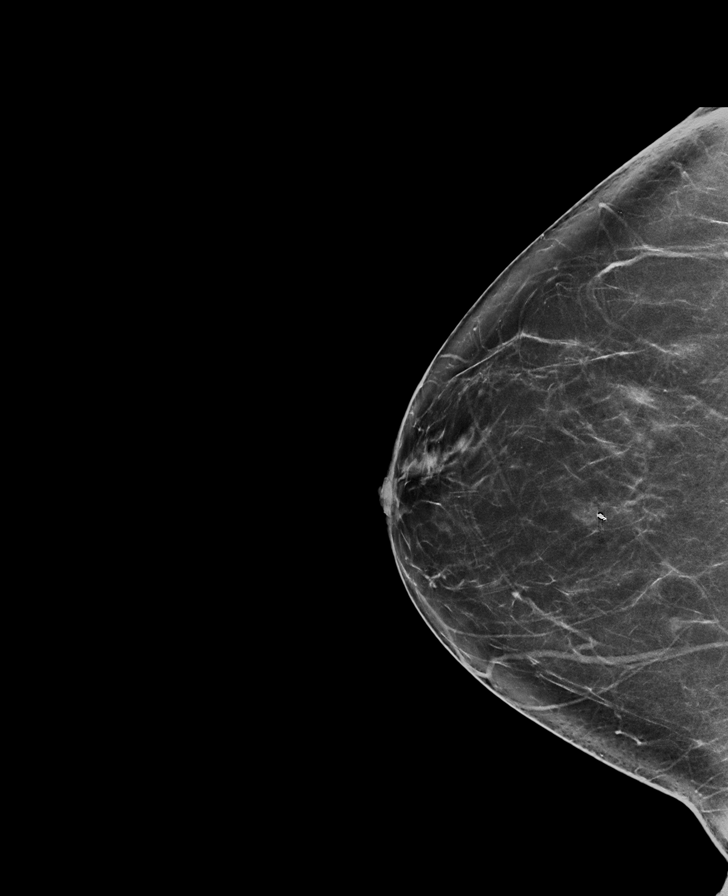

[R MLO]
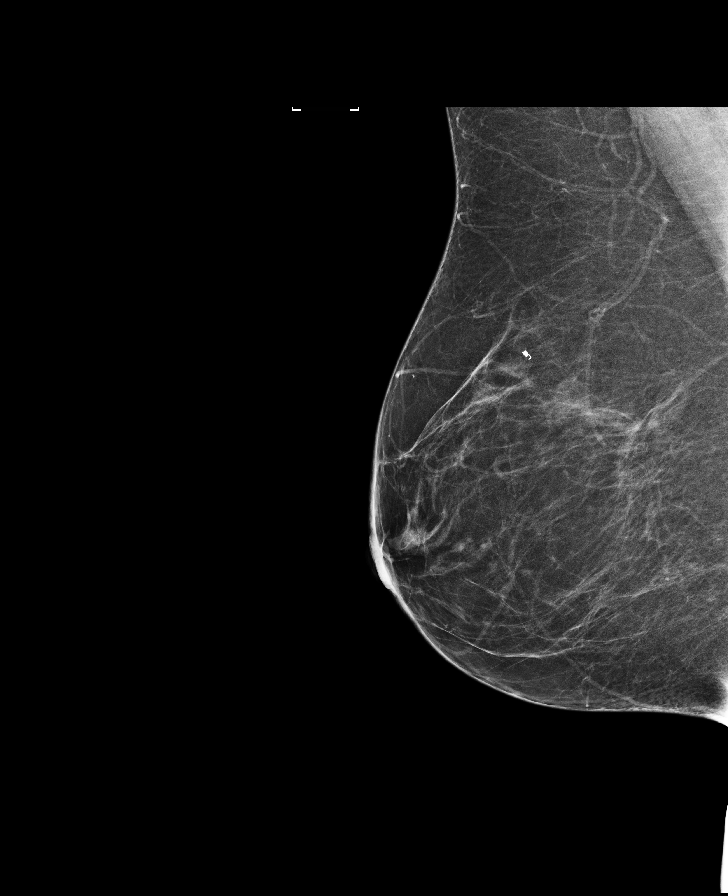

[L MLO synth-2D]
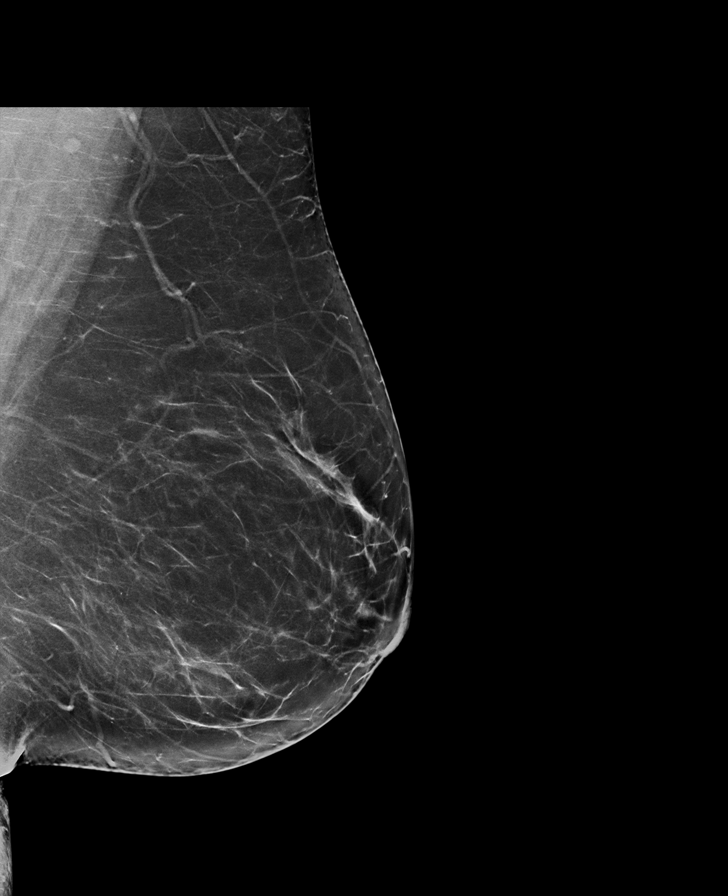

[R CC]
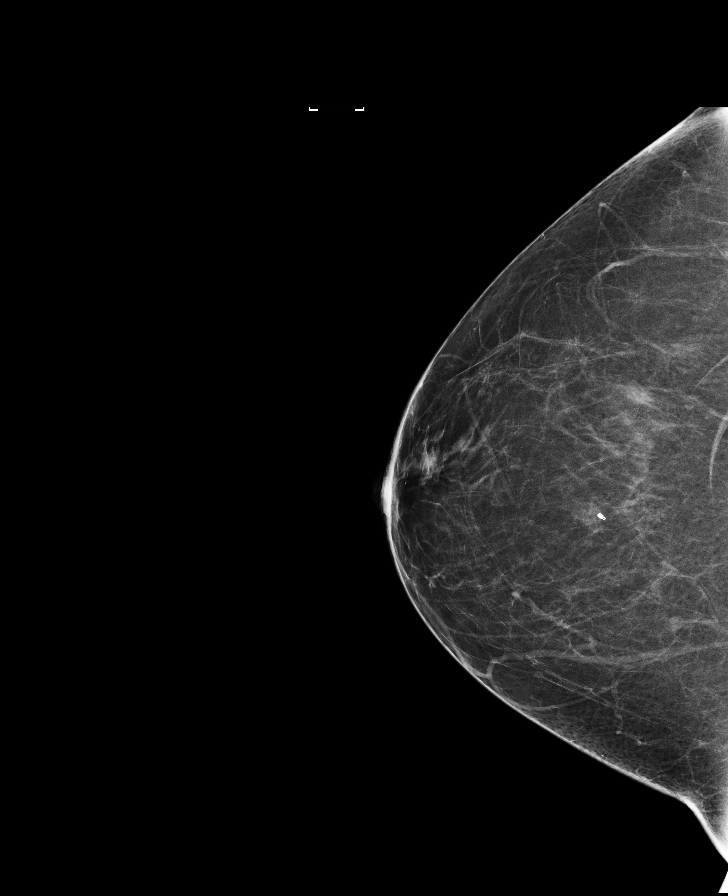

[L MLO]
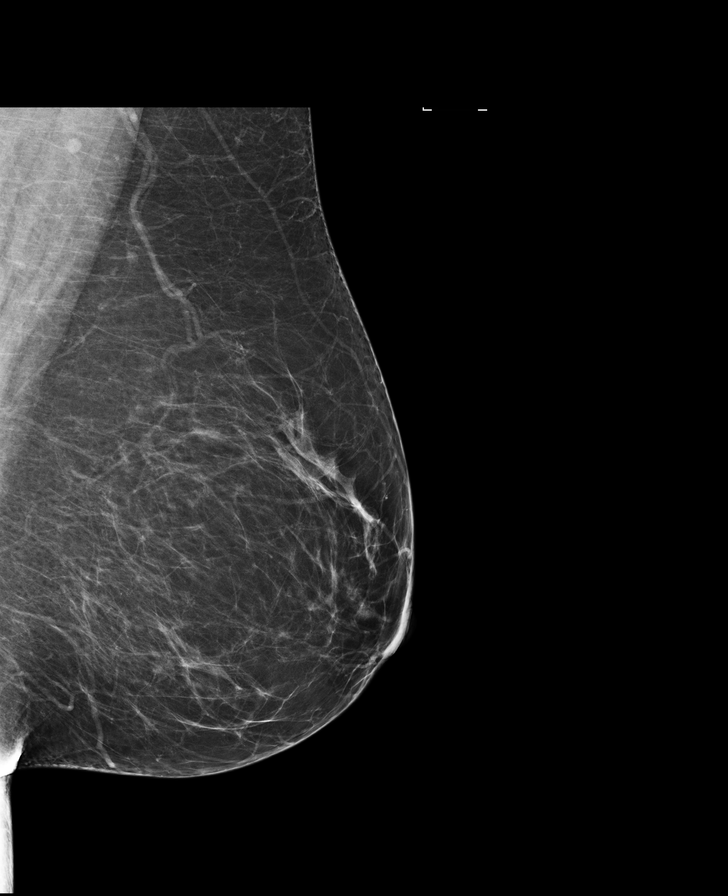

[8 of 28 positions shown; findings below may reference images not displayed]

FINDINGS: There are no findings suspicious for malignancy. Images were
processed with CAD.
IMPRESSION: No mammographic evidence of malignancy. A result letter of this
screening mammogram will be mailed directly to the patient.

RECOMMENDATION:
Screening mammogram in one year. (Code:[ZP])

BI-RADS CATEGORY  1: Negative.

## 2016-07-10 ENCOUNTER — Other Ambulatory Visit: Payer: Self-pay | Admitting: Adult Health

## 2016-07-13 NOTE — Telephone Encounter (Signed)
Ok to refill 90 +1  

## 2016-07-13 NOTE — Telephone Encounter (Signed)
Ok to refill 

## 2016-07-15 ENCOUNTER — Telehealth: Payer: Self-pay | Admitting: Adult Health

## 2016-07-15 NOTE — Telephone Encounter (Signed)
error 

## 2016-07-21 ENCOUNTER — Encounter: Payer: Self-pay | Admitting: Adult Health

## 2016-07-21 ENCOUNTER — Ambulatory Visit (INDEPENDENT_AMBULATORY_CARE_PROVIDER_SITE_OTHER): Payer: Managed Care, Other (non HMO) | Admitting: Adult Health

## 2016-07-21 VITALS — BP 156/110 | HR 76 | Temp 97.8°F | Wt 270.0 lb

## 2016-07-21 DIAGNOSIS — L259 Unspecified contact dermatitis, unspecified cause: Secondary | ICD-10-CM

## 2016-07-21 DIAGNOSIS — I10 Essential (primary) hypertension: Secondary | ICD-10-CM | POA: Diagnosis not present

## 2016-07-21 MED ORDER — LISINOPRIL 20 MG PO TABS: 20.0000 mg | ORAL_TABLET | Freq: Every day | ORAL | 3 refills | Status: DC

## 2016-07-21 MED ORDER — PREDNISONE 10 MG PO TABS: ORAL_TABLET | ORAL | 0 refills | Status: DC

## 2016-07-21 NOTE — Progress Notes (Signed)
Pre visit review using our clinic review tool, if applicable. No additional management support is needed unless otherwise documented below in the visit note. 

## 2016-07-21 NOTE — Progress Notes (Signed)
   Subjective:    Patient ID: Hampton Abbot, female    DOB: [birth year], 45 y.o.   MRN: [id number]  HPI   45 year old female who  has a past medical history of Ectopic pregnancy and GERD (gastroesophageal reflux disease). She presents today for an acute issue of a red itchy rash on bilateral legs. The rash has been present for the last 3 days. She denies changing any soaps, lotions, or detergents. The rash has not been spreading. There has been no discharge or vesicles.   She is also worried about her high blood pressure readings. She has been working about 80 hours a week and has gained 30 pounds since her last visit. She is not eating well and feels fatigued most days. She denies any headaches, blurred vision or headaches   BP Readings from Last 3 Encounters:  07/21/16 (!) 156/110  07/21/15 (!) 130/100  06/11/14 124/80   Wt Readings from Last 3 Encounters:  07/21/16 270 lb (122.5 kg)  09/10/15 240 lb 11.2 oz (109.2 kg)  07/21/15 240 lb 11.2 oz (109.2 kg)    Review of Systems  Constitutional: Positive for activity change and fatigue.  Respiratory: Negative.   Cardiovascular: Negative.   Skin: Positive for rash.  Neurological: Negative.   All other systems reviewed and are negative.      Objective:   Physical Exam  Constitutional: She is oriented to person, place, and time. She appears well-developed and well-nourished. No distress.  obese  Cardiovascular: Normal rate, regular rhythm, normal heart sounds and intact distal pulses.  Exam reveals no gallop.   No murmur heard. Pulmonary/Chest: Effort normal and breath sounds normal. No respiratory distress. She has no wheezes. She has no rales. She exhibits no tenderness.  Neurological: She is alert and oriented to person, place, and time.  Skin: Skin is warm and dry. Rash (red papular rash on bilateral medial ankles and half way up leg) noted. She is not diaphoretic. No erythema. No pallor.  Psychiatric: She has a normal mood  and affect. Her behavior is normal. Judgment and thought content normal.  Nursing note and vitals reviewed.     Assessment & Plan:  1. Contact dermatitis, unspecified contact dermatitis type, unspecified trigger - Appears as contact dermatitis. Will do prednisone taper and OTC hydrocortisone cream  - predniSONE (DELTASONE) 10 MG tablet; 40 mg x 3 days, 20 mg x 3 days, 10 mg x 3 days  Dispense: 21 tablet; Refill: 0 - Follow up if no improvement  2. Essential hypertension - lisinopril (PRINIVIL,ZESTRIL) 20 MG tablet; Take 1 tablet (20 mg total) by mouth daily.  Dispense: 30 tablet; Refill: 3 - She needs to lose weight. Encouraged healthy diet and frequent exercise - Follow up in two weeks or sooner if needed  Dorothyann Peng, NP

## 2016-08-11 ENCOUNTER — Encounter: Payer: Self-pay | Admitting: Adult Health

## 2016-08-11 ENCOUNTER — Ambulatory Visit (INDEPENDENT_AMBULATORY_CARE_PROVIDER_SITE_OTHER): Payer: Managed Care, Other (non HMO) | Admitting: Adult Health

## 2016-08-11 DIAGNOSIS — I10 Essential (primary) hypertension: Secondary | ICD-10-CM

## 2016-08-11 MED ORDER — LISINOPRIL 30 MG PO TABS: 30.0000 mg | ORAL_TABLET | Freq: Every day | ORAL | 3 refills | Status: DC

## 2016-08-11 NOTE — Progress Notes (Signed)
Subjective:    Patient ID: Holly Horn, female    DOB: 1971/04/29, 46 y.o.   MRN: [id number]  HPI  46 year old female who presents to the office today for 3 week follow up regarding hypertension. She was prescribed Lisinopril 20 mg during the last visit. She is not monitoring her blood pressure at home.   Today in the office her blood pressure is better controlled but not at goal. She denies any headaches or blurred vision.   She is going to start dieting and exercising with some friends.   BP Readings from Last 3 Encounters:  08/11/16 138/64  07/21/16 (!) 156/110  07/21/15 (!) 130/100     Review of Systems  Constitutional: Negative.   Respiratory: Negative.   Cardiovascular: Negative.   Neurological: Negative.   All other systems reviewed and are negative.  Past Medical History:  Diagnosis Date  . Ectopic pregnancy    x 3  . GERD (gastroesophageal reflux disease)     Social History   Social History  . Marital status: Single    Spouse name: N/A  . Number of children: N/A  . Years of education: N/A   Occupational History  . Not on file.   Social History Main Topics  . Smoking status: Current Every Day Smoker  . Smokeless tobacco: Not on file  . Alcohol use 0.0 oz/week     Comment: once or twice a week  . Drug use: No  . Sexual activity: Not on file   Other Topics Concern  . Not on file   Social History Narrative  . No narrative on file    Past Surgical History:  Procedure Laterality Date  . ECTOPIC PREGNANCY SURGERY    . [facility]  2008 & 2010    Family History  Problem Relation Age of Onset  . Hypertension Mother   . Hypertension Brother   . Osteoarthritis Father   . Breast cancer Other   . Cancer Maternal Grandmother     unknown  . Cancer Maternal Grandfather     unknown  . Cancer Paternal Grandmother     unknown  . Cancer Paternal Grandfather     unknowon    Allergies  Allergen Reactions  . Morphine Sulfate    REACTION: vomiting, SOB    Current Outpatient Prescriptions on File Prior to Visit  Medication Sig Dispense Refill  . buPROPion (WELLBUTRIN XL) 300 MG 24 hr tablet Take 1 tablet (300 mg total) by mouth daily. 30 tablet 3  . cetirizine (ZYRTEC) 10 MG tablet Take 10 mg by mouth daily.    . citalopram (CELEXA) 10 MG tablet TAKE 1 TABLET (10 MG TOTAL) BY MOUTH DAILY. 90 tablet 1  . DUEXIS 800-26.6 MG TABS Take 1 tablet by mouth 3 (three) times daily.  2  . lisinopril (PRINIVIL,ZESTRIL) 20 MG tablet Take 1 tablet (20 mg total) by mouth daily. 30 tablet 3  . PROAIR HFA 108 (90 Base) MCG/ACT inhaler Inhale 2 puffs into the lungs every 6 (six) hours as needed. for wheezing  0   No current facility-administered medications on file prior to visit.     BP 138/64   Temp 98.3 F (36.8 C) (Oral)   Ht 5\' 7"  (1.702 m)   Wt 269 lb (122 kg)   BMI 42.13 kg/m        Objective:   Physical Exam  Constitutional: She is oriented to person, place, and time. She appears well-developed and well-nourished.  No distress.  Eyes: Conjunctivae and EOM are normal. Pupils are equal, round, and reactive to light. Right eye exhibits no discharge. Left eye exhibits no discharge. No scleral icterus.  Cardiovascular: Normal rate, regular rhythm, normal heart sounds and intact distal pulses.  Exam reveals no gallop and no friction rub.   No murmur heard. Pulmonary/Chest: Effort normal and breath sounds normal. No respiratory distress. She has no wheezes. She has no rales. She exhibits no tenderness.  Neurological: She is alert and oriented to person, place, and time.  Skin: Skin is warm and dry. No rash noted. She is not diaphoretic. No erythema. No pallor.  Psychiatric: She has a normal mood and affect. Her behavior is normal. Judgment and thought content normal.  Nursing note and vitals reviewed.     Assessment & Plan:  1. Essential hypertension - Will increase lisinopril from 20 mg to 30 mg.  - lisinopril  (PRINIVIL,ZESTRIL) 30 MG tablet; Take 1 tablet (30 mg total) by mouth daily.  Dispense: 90 tablet; Refill: 3 - Needs to start monitoring blood pressure at home.  - Follow up in 2 weeks for recheck   Dorothyann Peng, NP

## 2016-08-12 ENCOUNTER — Other Ambulatory Visit: Payer: Self-pay | Admitting: Adult Health

## 2016-08-13 ENCOUNTER — Encounter: Payer: Self-pay | Admitting: Adult Health

## 2016-08-17 ENCOUNTER — Encounter: Payer: Self-pay | Admitting: Adult Health

## 2016-08-20 ENCOUNTER — Encounter: Payer: Self-pay | Admitting: Adult Health

## 2016-08-23 ENCOUNTER — Encounter: Payer: Self-pay | Admitting: Adult Health

## 2016-08-25 ENCOUNTER — Ambulatory Visit: Payer: Managed Care, Other (non HMO) | Admitting: Adult Health

## 2016-08-27 ENCOUNTER — Encounter: Payer: Self-pay | Admitting: Adult Health

## 2016-08-31 ENCOUNTER — Ambulatory Visit (INDEPENDENT_AMBULATORY_CARE_PROVIDER_SITE_OTHER): Payer: Managed Care, Other (non HMO) | Admitting: Adult Health

## 2016-08-31 VITALS — Temp 98.6°F | Ht 67.0 in | Wt 255.0 lb

## 2016-08-31 DIAGNOSIS — L723 Sebaceous cyst: Secondary | ICD-10-CM | POA: Diagnosis not present

## 2016-08-31 DIAGNOSIS — I10 Essential (primary) hypertension: Secondary | ICD-10-CM

## 2016-08-31 MED ORDER — DUEXIS 800-26.6 MG PO TABS
1.0000 | ORAL_TABLET | Freq: Three times a day (TID) | ORAL | 2 refills | Status: DC
Start: 2016-08-31 — End: 2018-03-01

## 2016-08-31 NOTE — Progress Notes (Signed)
Subjective:    Patient ID: Holly Horn, female    DOB: 12-15-1970, 46 y.o.   MRN: [id number]  HPI   46 year old female who presents to the office today for removal of cyst on right side of head. She reports that this cyst has started to become tender with touch.    Review of Systems  Skin:       Cysts   All other systems reviewed and are negative.  Past Medical History:  Diagnosis Date  . Ectopic pregnancy    x 3  . GERD (gastroesophageal reflux disease)     Social History   Social History  . Marital status: Single    Spouse name: N/A  . Number of children: N/A  . Years of education: N/A   Occupational History  . Not on file.   Social History Main Topics  . Smoking status: Current Every Day Smoker  . Smokeless tobacco: Not on file  . Alcohol use 0.0 oz/week     Comment: once or twice a week  . Drug use: No  . Sexual activity: Not on file   Other Topics Concern  . Not on file   Social History Narrative  . No narrative on file    Past Surgical History:  Procedure Laterality Date  . ECTOPIC PREGNANCY SURGERY    . [facility]  2008 & 2010    Family History  Problem Relation Age of Onset  . Hypertension Mother   . Hypertension Brother   . Osteoarthritis Father   . Breast cancer Other   . Cancer Maternal Grandmother     unknown  . Cancer Maternal Grandfather     unknown  . Cancer Paternal Grandmother     unknown  . Cancer Paternal Grandfather     unknowon    Allergies  Allergen Reactions  . Morphine Sulfate     REACTION: vomiting, SOB    Current Outpatient Prescriptions on File Prior to Visit  Medication Sig Dispense Refill  . buPROPion (WELLBUTRIN XL) 300 MG 24 hr tablet Take 1 tablet (300 mg total) by mouth daily. 30 tablet 3  . cetirizine (ZYRTEC) 10 MG tablet Take 10 mg by mouth daily.    . citalopram (CELEXA) 10 MG tablet TAKE 1 TABLET (10 MG TOTAL) BY MOUTH DAILY. 90 tablet 1  . lisinopril (PRINIVIL,ZESTRIL) 30 MG tablet  Take 1 tablet (30 mg total) by mouth daily. 90 tablet 3  . PROAIR HFA 108 (90 Base) MCG/ACT inhaler Inhale 2 puffs into the lungs every 6 (six) hours as needed. for wheezing  0   No current facility-administered medications on file prior to visit.     Temp 98.6 F (37 C) (Oral)   Ht 5\' 7"  (1.702 m)   Wt 255 lb (115.7 kg)   BMI 39.94 kg/m       Objective:   Physical Exam  Constitutional: She is oriented to person, place, and time. She appears well-developed and well-nourished. No distress.  HENT:  Head:    Musculoskeletal: Normal range of motion. She exhibits tenderness. She exhibits no edema or deformity.  Neurological: She is alert and oriented to person, place, and time.  Skin: Skin is warm and dry. No rash noted. She is not diaphoretic. No erythema. No pallor.  0.4 mm in diameter sebaceous cyst on right side scalp   Psychiatric: She has a normal mood and affect. Her behavior is normal. Judgment and thought content normal.  Nursing note  and vitals reviewed.      Assessment & Plan:  1. Sebaceous cyst Procedure:  Incision and removal of sebaceous cyst Risks, benefits, and alternatives explained and verbal consent obtained. Time out conducted. Surface cleaned with alcohol. 0.5 cc lidocaine with epinephine infiltrated around cyst  Adequate anesthesia ensured. Area prepped and draped in a sterile fashion. #11 blade used to make a slice incision into abscess. Cyst including sac was removed  Hemostasis achieved. Sealed with surgi-glue  Pt stable. Aftercare and follow-up advised.  Dorothyann Peng, NP

## 2016-09-29 ENCOUNTER — Encounter: Payer: Self-pay | Admitting: Adult Health

## 2016-09-30 ENCOUNTER — Ambulatory Visit (INDEPENDENT_AMBULATORY_CARE_PROVIDER_SITE_OTHER): Payer: Managed Care, Other (non HMO) | Admitting: Adult Health

## 2016-09-30 ENCOUNTER — Encounter: Payer: Self-pay | Admitting: Adult Health

## 2016-09-30 VITALS — BP 142/72 | Temp 97.8°F | Ht 67.0 in | Wt 255.0 lb

## 2016-09-30 DIAGNOSIS — M5432 Sciatica, left side: Secondary | ICD-10-CM | POA: Diagnosis not present

## 2016-09-30 MED ORDER — CYCLOBENZAPRINE HCL 10 MG PO TABS: 10.0000 mg | ORAL_TABLET | Freq: Three times a day (TID) | ORAL | 0 refills | Status: DC | PRN

## 2016-09-30 MED ORDER — METHYLPREDNISOLONE 4 MG PO TBPK: ORAL_TABLET | ORAL | 0 refills | Status: DC

## 2016-09-30 MED ORDER — KETOROLAC TROMETHAMINE 60 MG/2ML IM SOLN
60.0000 mg | Freq: Once | INTRAMUSCULAR | Status: AC
  Administered 2016-09-30: 60 mg via INTRAMUSCULAR

## 2016-09-30 NOTE — Progress Notes (Signed)
After obtaining consent, and per orders of Dorothyann Peng, NP, injection of Toradol given by Beckie Busing. Patient instructed to remain in clinic for 20 minutes afterwards, and to report any adverse reaction to me immediately.

## 2016-09-30 NOTE — Progress Notes (Signed)
Subjective:    Patient ID: Holly Horn, female    DOB: [birth year], 46 y.o.   MRN: [id number]  HPI   46 year old female who  has a past medical history of Ectopic pregnancy and GERD (gastroesophageal reflux disease). She presents to the office today for the complaint of left lower back pain with radiating pain to the left leg. This has been presents for 2 days. Pain is worse with weight baring. Pain feels " sharp" and at times "burning.".   Denies any trauma or falls.   Review of Systems See HPI  Past Medical History:  Diagnosis Date  . Ectopic pregnancy    x 3  . Essential hypertension   . GERD (gastroesophageal reflux disease)     Social History   Social History  . Marital status: Single    Spouse name: N/A  . Number of children: N/A  . Years of education: N/A   Occupational History  . Not on file.   Social History Main Topics  . Smoking status: Current Every Day Smoker  . Smokeless tobacco: Not on file  . Alcohol use 0.0 oz/week     Comment: once or twice a week  . Drug use: No  . Sexual activity: Not on file   Other Topics Concern  . Not on file   Social History Narrative  . No narrative on file    Past Surgical History:  Procedure Laterality Date  . ECTOPIC PREGNANCY SURGERY    . [facility]  2008 & 2010    Family History  Problem Relation Age of Onset  . Hypertension Mother   . Hypertension Brother   . Osteoarthritis Father   . Breast cancer Other   . Cancer Maternal Grandmother     unknown  . Cancer Maternal Grandfather     unknown  . Cancer Paternal Grandmother     unknown  . Cancer Paternal Grandfather     unknowon    Allergies  Allergen Reactions  . Morphine Sulfate     REACTION: vomiting, SOB    Current Outpatient Prescriptions on File Prior to Visit  Medication Sig Dispense Refill  . buPROPion (WELLBUTRIN XL) 300 MG 24 hr tablet Take 1 tablet (300 mg total) by mouth daily. 30 tablet 3  . cetirizine (ZYRTEC) 10 MG  tablet Take 10 mg by mouth daily.    . citalopram (CELEXA) 10 MG tablet TAKE 1 TABLET (10 MG TOTAL) BY MOUTH DAILY. 90 tablet 1  . DUEXIS 800-26.6 MG TABS Take 1 tablet by mouth 3 (three) times daily. 90 tablet 2  . lisinopril (PRINIVIL,ZESTRIL) 30 MG tablet Take 1 tablet (30 mg total) by mouth daily. 90 tablet 3  . PROAIR HFA 108 (90 Base) MCG/ACT inhaler Inhale 2 puffs into the lungs every 6 (six) hours as needed. for wheezing  0   No current facility-administered medications on file prior to visit.     BP (!) 142/72   Temp 97.8 F (36.6 C) (Oral)   Ht 5\' 7"  (1.702 m)   Wt 255 lb (115.7 kg)   BMI 39.94 kg/m       Objective:   Physical Exam  Constitutional: She is oriented to person, place, and time. She appears well-developed and well-nourished. No distress.  Cardiovascular: Normal rate, regular rhythm, normal heart sounds and intact distal pulses.  Exam reveals no gallop and no friction rub.   No murmur heard. Pulmonary/Chest: Effort normal and breath sounds normal. No respiratory  distress. She has no wheezes. She has no rales. She exhibits no tenderness.  Musculoskeletal: Normal range of motion. She exhibits no edema, tenderness or deformity.  Not able to reproduce pain with palpation.   Neurological: She is alert and oriented to person, place, and time.  Skin: Skin is warm and dry. No rash noted. She is not diaphoretic. No erythema. No pallor.  Psychiatric: She has a normal mood and affect. Her behavior is normal. Judgment and thought content normal.  Nursing note and vitals reviewed.     Assessment & Plan:  1. Sciatica of left side - cyclobenzaprine (FLEXERIL) 10 MG tablet; Take 1 tablet (10 mg total) by mouth 3 (three) times daily as needed for muscle spasms.  Dispense: 30 tablet; Refill: 0 - methylPREDNISolone (MEDROL DOSEPAK) 4 MG TBPK tablet; Take as directed  Dispense: 21 tablet; Refill: 0 - ketorolac (TORADOL) injection 60 mg; Inject 2 mLs (60 mg total) into the  muscle once. - Follow up if no improvement   Dorothyann Peng, NP

## 2016-10-01 ENCOUNTER — Encounter: Payer: Self-pay | Admitting: Adult Health

## 2016-10-01 ENCOUNTER — Telehealth: Payer: Self-pay | Admitting: Adult Health

## 2016-10-01 ENCOUNTER — Ambulatory Visit (INDEPENDENT_AMBULATORY_CARE_PROVIDER_SITE_OTHER): Payer: Managed Care, Other (non HMO) | Admitting: Family Medicine

## 2016-10-01 ENCOUNTER — Encounter: Payer: Self-pay | Admitting: Family Medicine

## 2016-10-01 VITALS — BP 104/80 | HR 75 | Temp 97.6°F | Ht 67.0 in | Wt 258.3 lb

## 2016-10-01 DIAGNOSIS — M5442 Lumbago with sciatica, left side: Secondary | ICD-10-CM

## 2016-10-01 LAB — POCT URINALYSIS DIPSTICK
Bilirubin, UA: NEGATIVE
Glucose, UA: NEGATIVE
Ketones, UA: NEGATIVE
LEUKOCYTES UA: NEGATIVE
NITRITE UA: NEGATIVE
PH UA: 7.5
PROTEIN UA: NEGATIVE
Spec Grav, UA: 1.025
UROBILINOGEN UA: 0.2

## 2016-10-01 LAB — POCT URINE PREGNANCY: Preg Test, Ur: NEGATIVE

## 2016-10-01 MED ORDER — TRAMADOL HCL 50 MG PO TABS: 50.0000 mg | ORAL_TABLET | Freq: Three times a day (TID) | ORAL | 0 refills | Status: DC | PRN

## 2016-10-01 NOTE — Progress Notes (Signed)
HPI:  Acute visit for back pain: -reports started 3 days ago after sleeping on the couch (which "always hurts my back") -pain is severe, sharp and with certain movements radiates to R buttock and upper lateral leg -denies weakness, but feels like if she tries to walk normally leg might "give away" -no falls, numbness, bowel or bladder incontinence -reports several spells of back pain in the past -has morphine allergy but reports she can take everything else including dilaudid, vicodin, percocet, norco, tramadol -no periods in 3 months except light spotting one month ago - reports seeing gyn for this and may be perimenopausal, reports hx multiple ectopic pregnancies with multiple surgeries and reports can not get pregnant 2ndary to tubes removed, one ovary removed, not sexually active -denies: fevers, malaise, chills, headache, abd pain, dysuria, hematuria, bleeding, rashes, recent new activities or injuries -she took flexeril and several ibuprofen a few hours ago and this did not help  ROS: See pertinent positives and negatives per HPI.  Past Medical History:  Diagnosis Date  . Ectopic pregnancy    x 3  . Essential hypertension   . GERD (gastroesophageal reflux disease)     Past Surgical History:  Procedure Laterality Date  . ECTOPIC PREGNANCY SURGERY    . [facility]  2008 & 2010    Family History  Problem Relation Age of Onset  . Hypertension Mother   . Hypertension Brother   . Osteoarthritis Father   . Breast cancer Other   . Cancer Maternal Grandmother     unknown  . Cancer Maternal Grandfather     unknown  . Cancer Paternal Grandmother     unknown  . Cancer Paternal Grandfather     unknowon    Social History   Social History  . Marital status: Single    Spouse name: N/A  . Number of children: N/A  . Years of education: N/A   Social History Main Topics  . Smoking status: Current Every Day Smoker  . Smokeless tobacco: Never Used  . Alcohol use  0.0 oz/week     Comment: once or twice a week  . Drug use: No  . Sexual activity: Not Asked   Other Topics Concern  . None   Social History Narrative  . None     Current Outpatient Prescriptions:  .  buPROPion (WELLBUTRIN XL) 300 MG 24 hr tablet, Take 1 tablet (300 mg total) by mouth daily., Disp: 30 tablet, Rfl: 3 .  cetirizine (ZYRTEC) 10 MG tablet, Take 10 mg by mouth daily., Disp: , Rfl:  .  citalopram (CELEXA) 10 MG tablet, TAKE 1 TABLET (10 MG TOTAL) BY MOUTH DAILY., Disp: 90 tablet, Rfl: 1 .  cyclobenzaprine (FLEXERIL) 10 MG tablet, Take 1 tablet (10 mg total) by mouth 3 (three) times daily as needed for muscle spasms., Disp: 30 tablet, Rfl: 0 .  DUEXIS 800-26.6 MG TABS, Take 1 tablet by mouth 3 (three) times daily., Disp: 90 tablet, Rfl: 2 .  lisinopril (PRINIVIL,ZESTRIL) 30 MG tablet, Take 1 tablet (30 mg total) by mouth daily., Disp: 90 tablet, Rfl: 3 .  methylPREDNISolone (MEDROL DOSEPAK) 4 MG TBPK tablet, Take as directed, Disp: 21 tablet, Rfl: 0 .  PROAIR HFA 108 (90 Base) MCG/ACT inhaler, Inhale 2 puffs into the lungs every 6 (six) hours as needed. for wheezing, Disp: , Rfl: 0 .  traMADol (ULTRAM) 50 MG tablet, Take 1 tablet (50 mg total) by mouth every 8 (eight) hours as needed., Disp: 10 tablet,  Rfl: 0  EXAM:  Vitals:   10/01/16 1608  BP: 104/80  Pulse: 75  Temp: 97.6 F (36.4 C)    Body mass index is 40.46 kg/m.  GENERAL: vitals reviewed and listed above, alert, oriented, appears well hydrated and in no acute distress  HEENT: atraumatic, conjunttiva clear, no obvious abnormalities on inspection of external nose and ears  NECK: no obvious masses on inspection  LUNGS: clear to auscultation bilaterally, no wheezes, rales or rhonchi, good air movement  CV: HRRR, no peripheral edema  ABD: BS+, soft, NTTP, no CVA TTP  MS: moves all extremities without noticeable abnormality Gait antalgic, but bears weight when asked an dable to climb on exam table and  walk Normal inspection of back, no obvious scoliosis or leg length descrepancy No bony TTP Soft tissue TTP at: L glutes, L low back difusely -/+ tests: neg trendelenburg,-facet loading,  -SLRT, -CLRT, -FABER, -FADIR Normal muscle strength, sensation to light touch and DTRs in LEs bilaterally   PSYCH: pleasant and cooperative, no obvious depression or anxiety  ASSESSMENT AND PLAN:  More than 50% of over 40 minutes spent in total in caring for this patient was spent face-to-face with the patient, counseling and/or coordinating care.  Discussed the following assessment and plan:  Acute left-sided low back pain with left-sided sciatica - Plan: POC Urinalysis Dipstick, POCT urine pregnancy  -we discussed possible serious and likely etiologies, workup and treatment, treatment risks and return precautions -udip fairly unremarkable - will add micro, urine preg neg (faint half streak on initial test so repeated) -exam without any focal neuro deficits or sig findings -given degree of reported pain advised eval in ER for prompt imaging pain controll, discussed test results, she refused - reports she just wants something for pain, also advised imaging tonight if not going to ER - she reports she is going home -she has listed morphine allergy but assures me she can take all other opoids, discussed risks of narcotic pain meds, safe dosing tramadol and OTC analgesic -she tells me she will go to the hospital immediately if worsening or if not improving by tomorrow -she tells me she will follow up with PCP early next week  -Patient advised to return or notify a doctor immediately if symptoms worsen or persist or new concerns arise.  Patient Instructions  Schedule follow up with PCP next week.  Please go directly to the hospital if worsening or not improving by tomorrow.  Use the medications only as prescribed.    Colin Benton R., DO

## 2016-10-01 NOTE — Telephone Encounter (Signed)
[facility] - Client [facility]     Patient Name: Holly Horn Initial Comment Caller states her medication is not working and she can hardly walk or stand up.   DOB: April 28, 1971      Nurse Assessment  Nurse: Jimmey Ralph, RN, Lissa Date/Time (Eastern Time): 10/01/2016 3:13:14 PM  Confirm and document reason for call. If symptomatic, describe symptoms. ---Caller states: Saw Dr yesterday gave me predisone and muscle relaxer cyclobenzaprine, and a shot. The Dr. said it is my sciatica nerve, bursititis, and plantar faciatitis. Her medication is not working and she can hardly walk or stand up. Pain if 10/10 when I stand up and when sitting 7-8/10. I have two jobs and there is no way I can work.  Does the patient have any new or worsening symptoms? ---Yes  Will a triage be completed? ---Yes  Related visit to physician within the last 2 weeks? ---Yes  Does the PT have any chronic conditions? (i.e. diabetes, asthma, etc.) ---Yes  List chronic conditions. ---HTN~ on BP medication  Is the patient pregnant or possibly pregnant? (Ask all females between the ages of 68-55) ---No  Is this a behavioral health or substance abuse call? ---No    Guidelines     Guideline Title Affirmed Question Affirmed Notes   Back Pain [1] SEVERE back pain (e.g., excruciating, unable to do any normal activities) AND [2] not improved 2 hours after pain medicine    Final Disposition User   See Physician within 4 Hours (or PCP triage) Hammonds, RN, Lissa     Referrals   REFERRED TO PCP OFFICE   Disagree/Comply: Disagree   Disagree/Comply Reason: Disagree with instructions

## 2016-10-01 NOTE — Patient Instructions (Signed)
Schedule follow up with PCP next week.  Please go directly to the hospital if worsening or not improving by tomorrow.  Use the medications only as prescribed.

## 2016-10-01 NOTE — Progress Notes (Signed)
Pre visit review using our clinic review tool, if applicable. No additional management support is needed unless otherwise documented below in the visit note. 

## 2016-10-04 ENCOUNTER — Telehealth: Payer: Self-pay

## 2016-10-04 NOTE — Telephone Encounter (Signed)
Appt scheduled with Tommi Rumps 10/05/16 @ [facility] Patient Name: Holly Horn Gender: Female DOB: 1970-12-24 Age: 46 Y 59 M 5 D Return Phone Number: [phone] (Primary) City/State/Zip: [place] [address] Client [facility] - Client Client Site [facility] - Day Physician Dorothyann Peng - NP Who Is Calling Patient / Member / Family / Caregiver Call Type Triage / Clinical Relationship To Patient Self Return Phone Number [phone] (Primary) Chief Complaint Walking difficulty Reason for Call Symptomatic / Request for [place] states her medication is not working and she can hardly walk or stand up.

## 2016-10-05 ENCOUNTER — Ambulatory Visit (INDEPENDENT_AMBULATORY_CARE_PROVIDER_SITE_OTHER)
Admission: RE | Admit: 2016-10-05 | Discharge: 2016-10-05 | Disposition: A | Discharge status: Home / Self Care | Payer: Managed Care, Other (non HMO) | Source: Ambulatory Visit | Attending: Adult Health | Admitting: Adult Health

## 2016-10-05 ENCOUNTER — Encounter: Payer: Self-pay | Admitting: Adult Health

## 2016-10-05 ENCOUNTER — Ambulatory Visit (INDEPENDENT_AMBULATORY_CARE_PROVIDER_SITE_OTHER): Payer: Managed Care, Other (non HMO) | Admitting: Adult Health

## 2016-10-05 VITALS — BP 128/74 | Temp 97.6°F | Wt 262.0 lb

## 2016-10-05 DIAGNOSIS — M5432 Sciatica, left side: Secondary | ICD-10-CM | POA: Diagnosis not present

## 2016-10-05 LAB — POC URINALSYSI DIPSTICK (AUTOMATED)
BILIRUBIN UA: NEGATIVE
GLUCOSE UA: NEGATIVE
KETONES UA: NEGATIVE
Leukocytes, UA: NEGATIVE
Nitrite, UA: NEGATIVE
PH UA: 6
Protein, UA: NEGATIVE
RBC UA: NEGATIVE
Spec Grav, UA: 1.025
Urobilinogen, UA: NEGATIVE

## 2016-10-05 LAB — POCT URINE PREGNANCY: Preg Test, Ur: NEGATIVE

## 2016-10-05 IMAGING — DX DG HIP (WITH OR WITHOUT PELVIS) 2-3V*L*
2 series · 2 of 2 positions shown · non-contrast
Comparison: None.

CLINICAL DATA: Left hip and low back pain for 1 week

EXAM:
DG HIP (WITH OR WITHOUT PELVIS) 2-3V LEFT

[hip ap]
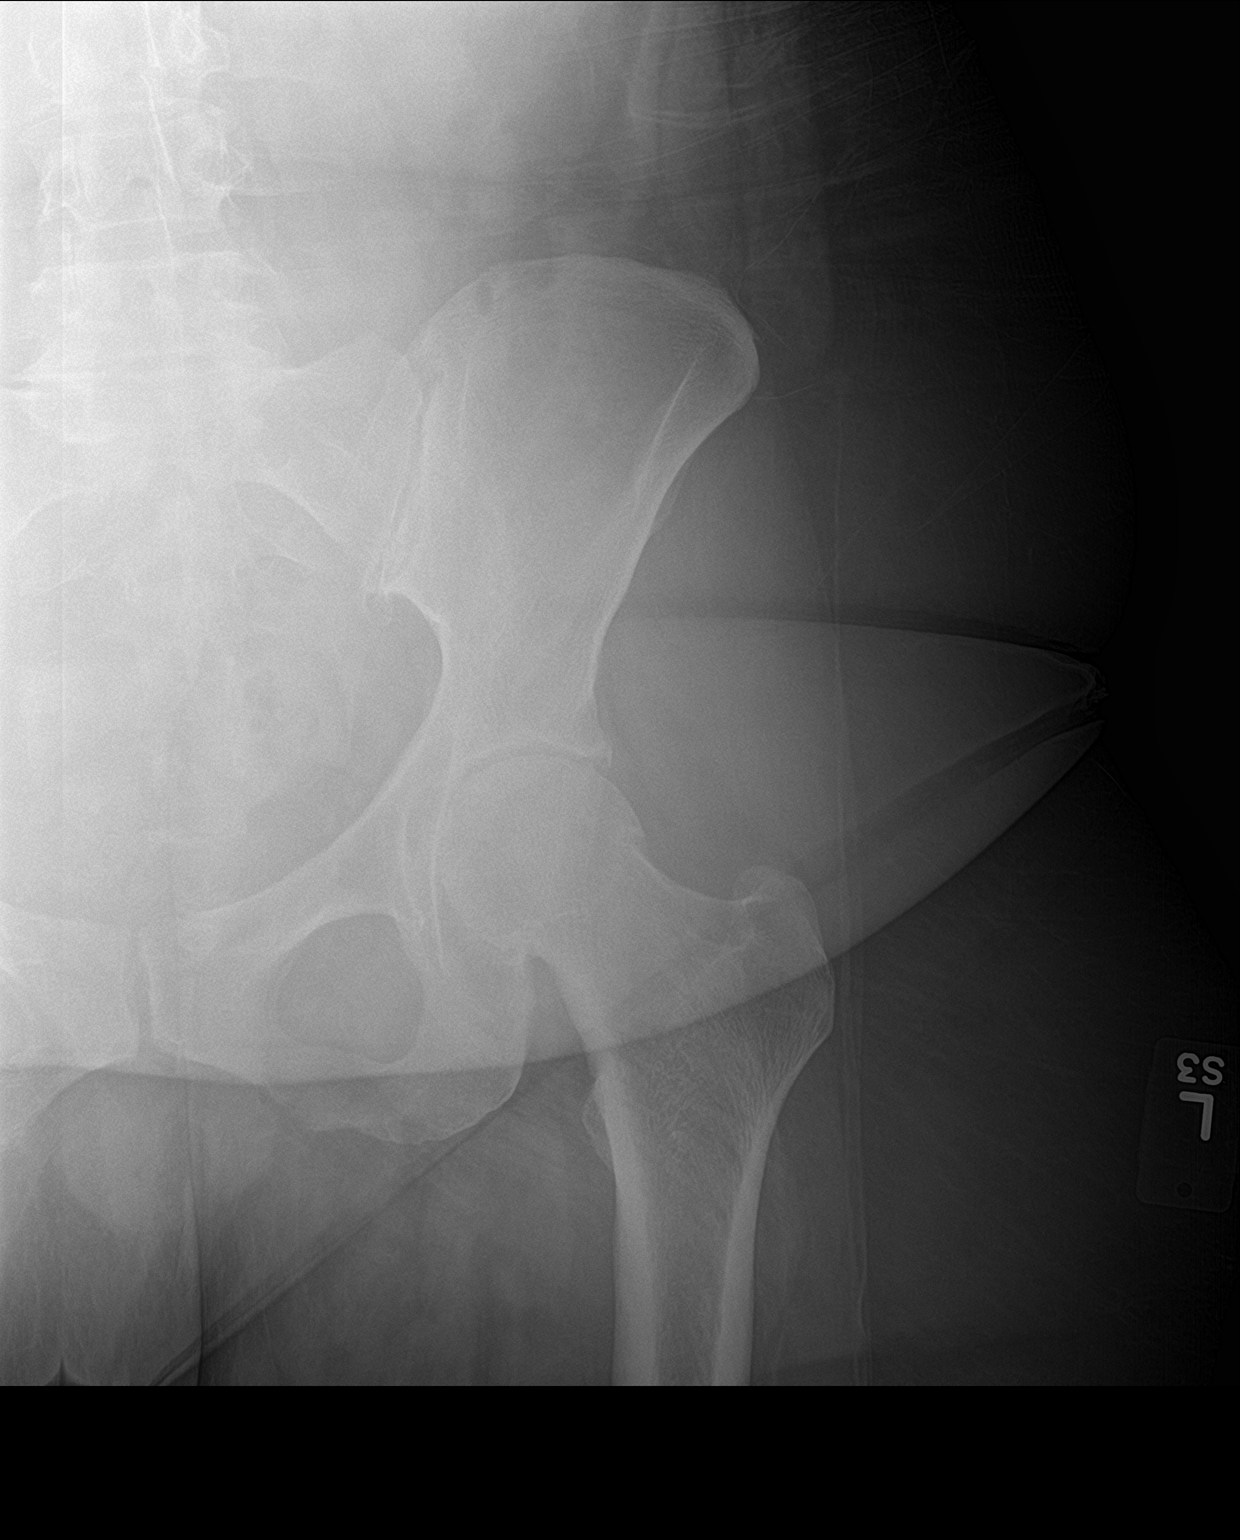

[hip frog leg]
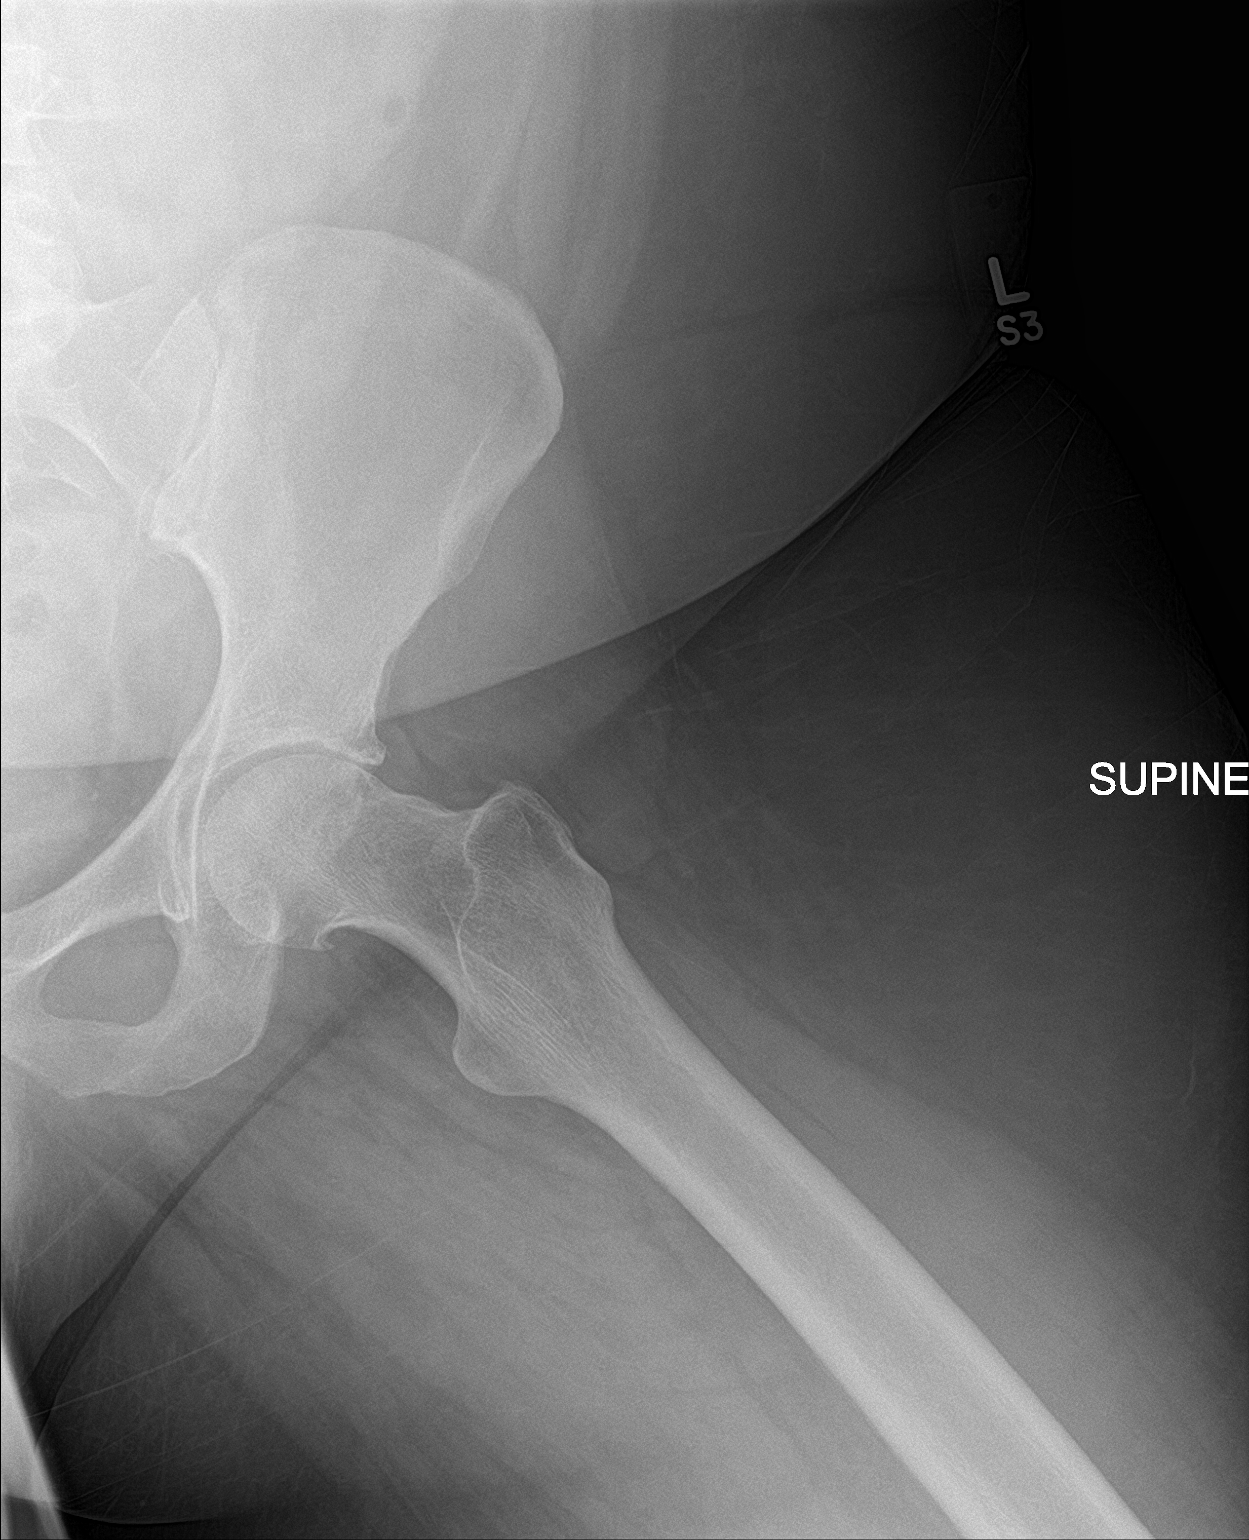

[2 of 2 positions shown; findings below may reference images not displayed]

FINDINGS: Mild joint space narrowing and spurring within the left hip. No
acute bony abnormality. Specifically, no fracture, subluxation, or
dislocation. Soft tissues are intact.
IMPRESSION: Mild degenerative changes in the left hip. No acute bony
abnormality.

## 2016-10-05 MED ORDER — OXYCODONE-ACETAMINOPHEN 10-325 MG PO TABS: 1.0000 | ORAL_TABLET | Freq: Four times a day (QID) | ORAL | 0 refills | Status: AC | PRN

## 2016-10-05 NOTE — Progress Notes (Signed)
Subjective:    Patient ID: Holly Horn, female    DOB: 11-15-1970, 46 y.o.   MRN: [id number]  HPI  46 year old female who  has a past medical history of Ectopic pregnancy; Essential hypertension; and GERD (gastroesophageal reflux disease). She presents to the office today for continuing left lower back pain. She was originally seen by this provider on 09/30/2016 and was diagnosed with sciatica. She was prescribed a medrol dose pack, flexeril and given a toradol injection. This did not help with her pain and she followed up with Dr. Maudie Mercury on one day later. As this visit a UA was run which showed trace blood and her Urine preg had a thin positive line, the second urine preg was negative. She was also given a prescription for Tramadol which she has been taking every 8 hours. She reports no improvement in her pain.   She continues to have a " deep pain" that is running from her left hip down the medial aspect of her left leg.   She denies any weakness but continues to feel as though " my left leg is going to give out".   Denies any fevers, chills, abd pain, or trauma.   Review of Systems   See pertinent positive and negatives in HPI   Past Medical History:  Diagnosis Date  . Ectopic pregnancy    x 3  . Essential hypertension   . GERD (gastroesophageal reflux disease)     Social History   Social History  . Marital status: Single    Spouse name: N/A  . Number of children: N/A  . Years of education: N/A   Occupational History  . Not on file.   Social History Main Topics  . Smoking status: Current Every Day Smoker  . Smokeless tobacco: Never Used  . Alcohol use 0.0 oz/week     Comment: once or twice a week  . Drug use: No  . Sexual activity: Not on file   Other Topics Concern  . Not on file   Social History Narrative  . No narrative on file    Past Surgical History:  Procedure Laterality Date  . ECTOPIC PREGNANCY SURGERY    . [facility]  2008 & 2010     Family History  Problem Relation Age of Onset  . Hypertension Mother   . Hypertension Brother   . Osteoarthritis Father   . Breast cancer Other   . Cancer Maternal Grandmother     unknown  . Cancer Maternal Grandfather     unknown  . Cancer Paternal Grandmother     unknown  . Cancer Paternal Grandfather     unknowon    Allergies  Allergen Reactions  . Morphine Sulfate     REACTION: vomiting, SOB    Current Outpatient Prescriptions on File Prior to Visit  Medication Sig Dispense Refill  . cetirizine (ZYRTEC) 10 MG tablet Take 10 mg by mouth daily.    . citalopram (CELEXA) 10 MG tablet TAKE 1 TABLET (10 MG TOTAL) BY MOUTH DAILY. 90 tablet 1  . cyclobenzaprine (FLEXERIL) 10 MG tablet Take 1 tablet (10 mg total) by mouth 3 (three) times daily as needed for muscle spasms. 30 tablet 0  . lisinopril (PRINIVIL,ZESTRIL) 30 MG tablet Take 1 tablet (30 mg total) by mouth daily. 90 tablet 3  . methylPREDNISolone (MEDROL DOSEPAK) 4 MG TBPK tablet Take as directed 21 tablet 0  . PROAIR HFA 108 (90 Base) MCG/ACT inhaler Inhale 2  puffs into the lungs every 6 (six) hours as needed. for wheezing  0  . traMADol (ULTRAM) 50 MG tablet Take 1 tablet (50 mg total) by mouth every 8 (eight) hours as needed. 10 tablet 0  . DUEXIS 800-26.6 MG TABS Take 1 tablet by mouth 3 (three) times daily. (Patient not taking: Reported on 10/05/2016) 90 tablet 2   No current facility-administered medications on file prior to visit.     BP 128/74   Temp 97.6 F (36.4 C) (Oral)   Wt 262 lb (118.8 kg)   BMI 41.04 kg/m        Objective:   Physical Exam  Constitutional: She is oriented to person, place, and time. She appears well-developed and well-nourished. No distress.  Cardiovascular: Normal rate, regular rhythm, normal heart sounds and intact distal pulses.  Exam reveals no gallop.   No murmur heard. Pulmonary/Chest: Effort normal and breath sounds normal. No respiratory distress. She has no wheezes.  She has no rales. She exhibits no tenderness.  Musculoskeletal: She exhibits no edema or tenderness.  Able to walk with limping gait. She winces in pain when going from sitting to standing position.   Neurological: She is alert and oriented to person, place, and time.  Skin: Skin is warm and dry. No rash noted. She is not diaphoretic. No erythema. No pallor.  Psychiatric: She has a normal mood and affect. Her behavior is normal. Judgment and thought content normal.  Nursing note and vitals reviewed.     Assessment & Plan:  1. Sciatica of left side - I will get X ray of left hip. MRI would be better but she refuses MRI at this time due to cost.  - oxyCODONE-acetaminophen (PERCOCET) 10-325 MG tablet; Take 1 tablet by mouth every 6 (six) hours as needed for pain.  Dispense: 12 tablet; Refill: 0 - DG HIP UNILAT WITH PELVIS 2-3 VIEWS LEFT; Future - POCT Urinalysis Dipstick (Automated)- Negative  - POCT urine pregnancy- negative  - Follow up if no improvement in one week  - Rest as much as possible   Dorothyann Peng, NP

## 2016-10-05 NOTE — Telephone Encounter (Signed)
Updated patient on her x ray results. She has mild arthritis and spurring. I do not think this is what is causing her pain

## 2016-10-11 ENCOUNTER — Encounter: Payer: Self-pay | Admitting: Adult Health

## 2016-12-17 ENCOUNTER — Other Ambulatory Visit: Payer: Self-pay

## 2016-12-17 ENCOUNTER — Encounter: Payer: Self-pay | Admitting: Adult Health

## 2016-12-17 DIAGNOSIS — I10 Essential (primary) hypertension: Secondary | ICD-10-CM

## 2016-12-17 MED ORDER — LISINOPRIL 40 MG PO TABS: 40.0000 mg | ORAL_TABLET | Freq: Every day | ORAL | 1 refills | Status: DC

## 2017-01-12 ENCOUNTER — Encounter: Payer: Self-pay | Admitting: Adult Health

## 2017-01-12 ENCOUNTER — Other Ambulatory Visit: Payer: Self-pay | Admitting: Adult Health

## 2017-01-12 MED ORDER — CITALOPRAM HYDROBROMIDE 10 MG PO TABS: 10.0000 mg | ORAL_TABLET | Freq: Every day | ORAL | 0 refills | Status: DC

## 2017-01-14 ENCOUNTER — Other Ambulatory Visit: Payer: Self-pay | Admitting: Adult Health

## 2017-02-28 ENCOUNTER — Encounter: Payer: Self-pay | Admitting: Adult Health

## 2017-02-28 ENCOUNTER — Ambulatory Visit (INDEPENDENT_AMBULATORY_CARE_PROVIDER_SITE_OTHER): Payer: Managed Care, Other (non HMO) | Admitting: Adult Health

## 2017-02-28 VITALS — BP 118/80 | Temp 97.9°F | Ht 66.0 in | Wt 255.0 lb

## 2017-02-28 DIAGNOSIS — IMO0001 Reserved for inherently not codable concepts without codable children: Secondary | ICD-10-CM

## 2017-02-28 DIAGNOSIS — K2100 Gastro-esophageal reflux disease with esophagitis, without bleeding: Secondary | ICD-10-CM

## 2017-02-28 DIAGNOSIS — Z6841 Body Mass Index (BMI) 40.0 and over, adult: Secondary | ICD-10-CM | POA: Diagnosis not present

## 2017-02-28 DIAGNOSIS — F32A Depression, unspecified: Secondary | ICD-10-CM

## 2017-02-28 DIAGNOSIS — F329 Major depressive disorder, single episode, unspecified: Secondary | ICD-10-CM

## 2017-02-28 DIAGNOSIS — I10 Essential (primary) hypertension: Secondary | ICD-10-CM

## 2017-02-28 DIAGNOSIS — F17200 Nicotine dependence, unspecified, uncomplicated: Secondary | ICD-10-CM | POA: Diagnosis not present

## 2017-02-28 DIAGNOSIS — Z0000 Encounter for general adult medical examination without abnormal findings: Secondary | ICD-10-CM | POA: Diagnosis not present

## 2017-02-28 DIAGNOSIS — E669 Obesity, unspecified: Secondary | ICD-10-CM

## 2017-02-28 LAB — HEPATIC FUNCTION PANEL
ALBUMIN: 3.8 g/dL (ref 3.5–5.2)
ALK PHOS: 82 U/L (ref 39–117)
ALT: 30 U/L (ref 0–35)
AST: 19 U/L (ref 0–37)
Bilirubin, Direct: 0.1 mg/dL (ref 0.0–0.3)
Total Bilirubin: 0.5 mg/dL (ref 0.2–1.2)
Total Protein: 6.1 g/dL (ref 6.0–8.3)

## 2017-02-28 LAB — TSH: TSH: 2.38 u[IU]/mL (ref 0.35–4.50)

## 2017-02-28 LAB — LIPID PANEL
CHOLESTEROL: 180 mg/dL (ref 0–200)
HDL: 59.8 mg/dL (ref >39.00)
LDL Cholesterol: 98 mg/dL (ref 0–99)
NonHDL: 120.04
TRIGLYCERIDES: 109 mg/dL (ref 0.0–149.0)
Total CHOL/HDL Ratio: 3
VLDL: 21.8 mg/dL (ref 0.0–40.0)

## 2017-02-28 LAB — BASIC METABOLIC PANEL
BUN: 13 mg/dL (ref 6–23)
CO2: 28 mEq/L (ref 19–32)
CREATININE: 0.78 mg/dL (ref 0.40–1.20)
Calcium: 9.4 mg/dL (ref 8.4–10.5)
Chloride: 102 mEq/L (ref 96–112)
GFR: 84.14 mL/min (ref >60.00)
GLUCOSE: 94 mg/dL (ref 70–99)
Potassium: 4.7 mEq/L (ref 3.5–5.1)
Sodium: 138 mEq/L (ref 135–145)

## 2017-02-28 LAB — CBC WITH DIFFERENTIAL/PLATELET
Basophils Absolute: 0.1 10*3/uL (ref 0.0–0.1)
Basophils Relative: 0.6 % (ref 0.0–3.0)
EOS ABS: 0.2 10*3/uL (ref 0.0–0.7)
Eosinophils Relative: 2 % (ref 0.0–5.0)
HCT: 38.3 % (ref 36.0–46.0)
Hemoglobin: 12.5 g/dL (ref 12.0–15.0)
LYMPHS ABS: 2.5 10*3/uL (ref 0.7–4.0)
Lymphocytes Relative: 32.1 % (ref 12.0–46.0)
MCHC: 32.6 g/dL (ref 30.0–36.0)
MCV: 90.7 fl (ref 78.0–100.0)
MONO ABS: 0.4 10*3/uL (ref 0.1–1.0)
Monocytes Relative: 5.6 % (ref 3.0–12.0)
NEUTROS PCT: 59.7 % (ref 43.0–77.0)
Neutro Abs: 4.7 10*3/uL (ref 1.4–7.7)
Platelets: 317 10*3/uL (ref 150.0–400.0)
RBC: 4.22 Mil/uL (ref 3.87–5.11)
RDW: 13.9 % (ref 11.5–15.5)
WBC: 7.9 10*3/uL (ref 4.0–10.5)

## 2017-02-28 LAB — HEMOGLOBIN A1C: HEMOGLOBIN A1C: 5.8 % (ref 4.6–6.5)

## 2017-02-28 MED ORDER — OMEPRAZOLE 20 MG PO CPDR: 20.0000 mg | DELAYED_RELEASE_CAPSULE | Freq: Every day | ORAL | 3 refills | Status: DC

## 2017-02-28 MED ORDER — LOSARTAN POTASSIUM 50 MG PO TABS: 50.0000 mg | ORAL_TABLET | Freq: Every day | ORAL | 1 refills | Status: DC

## 2017-02-28 NOTE — Progress Notes (Signed)
Subjective:    Patient ID: Hampton Abbot, female    DOB: 1970/11/08, 46 y.o.   MRN: [id number]  HPI  Patient presents for yearly preventative medicine examination. She is a pleasant 46 year old female who  has a past medical history of Ectopic pregnancy; Essential hypertension; and GERD (gastroesophageal reflux disease).  All immunizations and health maintenance protocols were reviewed with the patient and needed orders were placed. She is up to date on her vaccinations   Appropriate screening laboratory values were ordered for the patient including screening of hyperlipidemia, renal function and hepatic function. If indicated by BPH, a PSA was ordered.  Medication reconciliation,  past medical history, social history, problem list and allergies were reviewed in detail with the patient  Goals were established with regard to weight loss, exercise, and  diet in compliance with medications  She takes lisinopril 40 mg for management of essential hypertension. She does report that she has had a dry cough for multiple months at this point and feels as though it may be from Lisinopril.   She also takes Celexa 10 mg to help with anxiety and depression   She is due next month for a mammogram. She is up to date on her pap. She has seen her dentist but has not seen her eye doctor   She is taking OTC Nexium for management of GERD - she would like to have a prescription   Unfortunately, she continues to smoke but reports that she is cutting back as she does not like the way she feels in the morning after smoking   Wt Readings from Last 3 Encounters:  02/28/17 255 lb (115.7 kg)  10/05/16 262 lb (118.8 kg)  10/01/16 258 lb 4.8 oz (117.2 kg)    Review of Systems  Constitutional: Negative.   HENT: Negative.   Eyes: Negative.   Respiratory: Negative.   Cardiovascular: Negative.   Gastrointestinal: Negative.   Endocrine: Negative.   Genitourinary: Negative.   Musculoskeletal: Negative.     Skin: Negative.   Allergic/Immunologic: Negative.   Neurological: Negative.   Hematological: Negative.   Psychiatric/Behavioral: Negative.   All other systems reviewed and are negative.  Past Medical History:  Diagnosis Date  . Ectopic pregnancy    x 3  . Essential hypertension   . GERD (gastroesophageal reflux disease)     Social History   Social History  . Marital status: Single    Spouse name: N/A  . Number of children: N/A  . Years of education: N/A   Occupational History  . Not on file.   Social History Main Topics  . Smoking status: Current Some Day Smoker  . Smokeless tobacco: Never Used  . Alcohol use 3.6 oz/week    6 Cans of beer per week  . Drug use: No  . Sexual activity: Not on file   Other Topics Concern  . Not on file   Social History Narrative  . No narrative on file    Past Surgical History:  Procedure Laterality Date  . ECTOPIC PREGNANCY SURGERY    . [facility] [facility]  2008 & 2010    Family History  Problem Relation Age of Onset  . Hypertension Mother   . Hypertension Brother   . Osteoarthritis Father   . Breast cancer Other   . Cancer Maternal Grandmother        unknown  . Cancer Maternal Grandfather        unknown  . Cancer Paternal Grandmother  unknown  . Cancer Paternal Grandfather        unknowon    Allergies  Allergen Reactions  . Morphine Sulfate     REACTION: vomiting, SOB  . Progesterone Nausea Only    Current Outpatient Prescriptions on File Prior to Visit  Medication Sig Dispense Refill  . citalopram (CELEXA) 10 MG tablet TAKE 1 TABLET BY MOUTH EVERY DAY 90 tablet 0  . PROAIR HFA 108 (90 Base) MCG/ACT inhaler Inhale 2 puffs into the lungs every 6 (six) hours as needed. for wheezing  0  . cetirizine (ZYRTEC) 10 MG tablet Take 10 mg by mouth daily.    . DUEXIS 800-26.6 MG TABS Take 1 tablet by mouth 3 (three) times daily. (Patient not taking: Reported on 10/05/2016) 90 tablet 2   No current  facility-administered medications on file prior to visit.     BP 118/80 (BP Location: Left Arm)   Temp 97.9 F (36.6 C) (Oral)   Ht 5\' 6"  (1.676 m)   Wt 255 lb (115.7 kg)   BMI 41.16 kg/m       Objective:   Physical Exam  Constitutional: She is oriented to person, place, and time. She appears well-developed and well-nourished. No distress.  Obese   HENT:  Head: Normocephalic and atraumatic.  Right Ear: External ear normal.  Left Ear: External ear normal.  Nose: Nose normal.  Mouth/Throat: Oropharynx is clear and moist. No oropharyngeal exudate.  Eyes: Pupils are equal, round, and reactive to light. Conjunctivae and EOM are normal. Right eye exhibits no discharge. Left eye exhibits no discharge. No scleral icterus.  Neck: Normal range of motion. Neck supple. No JVD present. No tracheal deviation present. No thyromegaly present.  Cardiovascular: Normal rate, regular rhythm, normal heart sounds and intact distal pulses.  Exam reveals no gallop and no friction rub.   No murmur heard. Pulmonary/Chest: Effort normal and breath sounds normal. No stridor. No respiratory distress. She has no wheezes. She has no rales. She exhibits no tenderness.  Abdominal: Soft. Bowel sounds are normal. She exhibits no distension and no mass. There is no tenderness. There is no rebound and no guarding.  Musculoskeletal: Normal range of motion. She exhibits no edema, tenderness or deformity.  Lymphadenopathy:    She has no cervical adenopathy.  Neurological: She is alert and oriented to person, place, and time. She has normal reflexes. She displays normal reflexes. No cranial nerve deficit. She exhibits normal muscle tone. Coordination normal.  Skin: Skin is warm and dry. No rash noted. She is not diaphoretic. No erythema. No pallor.  Psychiatric: She has a normal mood and affect. Her behavior is normal. Judgment and thought content normal.  Nursing note and vitals reviewed.     Assessment & Plan:  1.  Routine general medical examination at a health care facility - Needs to work on weight loss through diet and exercise - needs to quit smoking  - Follow up in one year or sooner if needed - Basic metabolic panel - CBC with Differential/Platelet - Hemoglobin A1c - Hepatic function panel - Lipid panel - TSH  2. Essential hypertension - Will switch from Lisinopril to Cozzar. Monitor BP at home and send me a note in my chart with readings - Basic metabolic panel - CBC with Differential/Platelet - Hemoglobin A1c - Hepatic function panel - Lipid panel - TSH  3. Class 3 obesity with serious comorbidity and body mass index (BMI) of 40.0 to 44.9 in adult, unspecified obesity type ([place]) -  Needs to lose weight through diet and exercise   4. TOBACCO ABUSE - Educated on the importance of quitting smoking. She does not want to try any medications as this point   5. Depression, unspecified depression type - Well controlled on current medications  6. Gastroesophageal reflux disease with esophagitis - omeprazole (PRILOSEC) 20 MG capsule; Take 1 capsule (20 mg total) by mouth daily.  Dispense: 90 capsule; Refill: 3  Dorothyann Peng, NP

## 2017-03-01 ENCOUNTER — Encounter: Payer: Self-pay | Admitting: Adult Health

## 2017-03-10 ENCOUNTER — Encounter: Payer: Self-pay | Admitting: Adult Health

## 2017-04-28 ENCOUNTER — Other Ambulatory Visit: Payer: Self-pay | Admitting: Adult Health

## 2017-04-28 MED ORDER — CITALOPRAM HYDROBROMIDE 10 MG PO TABS: 10.0000 mg | ORAL_TABLET | Freq: Every day | ORAL | 3 refills | Status: DC

## 2017-04-28 NOTE — Telephone Encounter (Signed)
Sent to the pharmacy by e-scribe.   5. Depression, unspecified depression type - Well controlled on current medications  6. Gastroesophageal reflux disease with esophagitis - omeprazole (PRILOSEC) 20 MG capsule; Take 1 capsule (20 mg total) by mouth daily.  Dispense: 90 capsule; Refill: 3  Dorothyann Peng, NP

## 2017-04-29 ENCOUNTER — Encounter: Payer: Self-pay | Admitting: Adult Health

## 2017-06-21 ENCOUNTER — Encounter: Payer: Self-pay | Admitting: Adult Health

## 2017-09-08 ENCOUNTER — Other Ambulatory Visit: Payer: Self-pay | Admitting: Adult Health

## 2017-09-08 DIAGNOSIS — I10 Essential (primary) hypertension: Secondary | ICD-10-CM

## 2017-09-08 NOTE — Telephone Encounter (Signed)
Medication sent to pharmacy electronically.  

## 2017-11-07 ENCOUNTER — Encounter: Payer: Self-pay | Admitting: Adult Health

## 2017-11-08 ENCOUNTER — Encounter: Payer: Self-pay | Admitting: Adult Health

## 2017-11-08 NOTE — Progress Notes (Deleted)
   Subjective:    Patient ID: Holly Horn, female    DOB: Jul 05, 1971, 47 y.o.   MRN: [id number]  HPI Patient presents for yearly preventative medicine examination. She is a pleasant 47 year old female who  has a past medical history of Ectopic pregnancy, Essential hypertension, and GERD (gastroesophageal reflux disease).    All immunizations and health maintenance protocols were reviewed with the patient and needed orders were placed.  Appropriate screening laboratory values were ordered for the patient including screening of hyperlipidemia, renal function and hepatic function. If indicated by BPH, a PSA was ordered.  Medication reconciliation,  past medical history, social history, problem list and allergies were reviewed in detail with the patient  Goals were established with regard to weight loss, exercise, and  diet in compliance with medications  End of life planning was discussed.    Review of Systems     Objective:   Physical Exam        Assessment & Plan:

## 2018-03-01 ENCOUNTER — Ambulatory Visit (INDEPENDENT_AMBULATORY_CARE_PROVIDER_SITE_OTHER): Payer: Managed Care, Other (non HMO) | Admitting: Adult Health

## 2018-03-01 ENCOUNTER — Encounter: Payer: Self-pay | Admitting: Adult Health

## 2018-03-01 VITALS — BP 138/90 | Temp 98.4°F | Ht 66.5 in | Wt 260.0 lb

## 2018-03-01 DIAGNOSIS — Z0000 Encounter for general adult medical examination without abnormal findings: Secondary | ICD-10-CM | POA: Diagnosis not present

## 2018-03-01 DIAGNOSIS — I10 Essential (primary) hypertension: Secondary | ICD-10-CM | POA: Diagnosis not present

## 2018-03-01 DIAGNOSIS — F17200 Nicotine dependence, unspecified, uncomplicated: Secondary | ICD-10-CM

## 2018-03-01 DIAGNOSIS — K219 Gastro-esophageal reflux disease without esophagitis: Secondary | ICD-10-CM

## 2018-03-01 LAB — CBC WITH DIFFERENTIAL/PLATELET
BASOS PCT: 0.8 % (ref 0.0–3.0)
Basophils Absolute: 0.1 10*3/uL (ref 0.0–0.1)
Eosinophils Absolute: 0.2 10*3/uL (ref 0.0–0.7)
Eosinophils Relative: 2.2 % (ref 0.0–5.0)
HEMATOCRIT: 37.9 % (ref 36.0–46.0)
HEMOGLOBIN: 12.4 g/dL (ref 12.0–15.0)
LYMPHS PCT: 30.6 % (ref 12.0–46.0)
Lymphs Abs: 2.4 10*3/uL (ref 0.7–4.0)
MCHC: 32.7 g/dL (ref 30.0–36.0)
MCV: 88.7 fl (ref 78.0–100.0)
MONOS PCT: 6 % (ref 3.0–12.0)
Monocytes Absolute: 0.5 10*3/uL (ref 0.1–1.0)
Neutro Abs: 4.7 10*3/uL (ref 1.4–7.7)
Neutrophils Relative %: 60.4 % (ref 43.0–77.0)
Platelets: 295 10*3/uL (ref 150.0–400.0)
RBC: 4.28 Mil/uL (ref 3.87–5.11)
RDW: 15.1 % (ref 11.5–15.5)
WBC: 7.7 10*3/uL (ref 4.0–10.5)

## 2018-03-01 LAB — HEPATIC FUNCTION PANEL
ALBUMIN: 3.9 g/dL (ref 3.5–5.2)
ALK PHOS: 97 U/L (ref 39–117)
ALT: 31 U/L (ref 0–35)
AST: 23 U/L (ref 0–37)
BILIRUBIN DIRECT: 0.1 mg/dL (ref 0.0–0.3)
Total Bilirubin: 0.6 mg/dL (ref 0.2–1.2)
Total Protein: 6.6 g/dL (ref 6.0–8.3)

## 2018-03-01 LAB — VITAMIN D 25 HYDROXY (VIT D DEFICIENCY, FRACTURES): VITD: 10.24 ng/mL — ABNORMAL LOW (ref 30.00–100.00)

## 2018-03-01 LAB — LIPID PANEL
Cholesterol: 197 mg/dL (ref 0–200)
HDL: 61 mg/dL (ref >39.00)
LDL Cholesterol: 114 mg/dL — ABNORMAL HIGH (ref 0–99)
NONHDL: 135.61
TRIGLYCERIDES: 110 mg/dL (ref 0.0–149.0)
Total CHOL/HDL Ratio: 3
VLDL: 22 mg/dL (ref 0.0–40.0)

## 2018-03-01 LAB — BASIC METABOLIC PANEL
BUN: 12 mg/dL (ref 6–23)
CHLORIDE: 104 meq/L (ref 96–112)
CO2: 26 mEq/L (ref 19–32)
Calcium: 9.3 mg/dL (ref 8.4–10.5)
Creatinine, Ser: 0.62 mg/dL (ref 0.40–1.20)
GFR: 109.19 mL/min (ref >60.00)
Glucose, Bld: 101 mg/dL — ABNORMAL HIGH (ref 70–99)
POTASSIUM: 4.3 meq/L (ref 3.5–5.1)
SODIUM: 138 meq/L (ref 135–145)

## 2018-03-01 LAB — HEMOGLOBIN A1C: Hgb A1c MFr Bld: 5.7 % (ref 4.6–6.5)

## 2018-03-01 LAB — TSH: TSH: 1.92 u[IU]/mL (ref 0.35–4.50)

## 2018-03-01 MED ORDER — OMEPRAZOLE 20 MG PO CPDR: 20.0000 mg | DELAYED_RELEASE_CAPSULE | Freq: Every day | ORAL | 3 refills | Status: DC

## 2018-03-01 MED ORDER — LOSARTAN POTASSIUM 50 MG PO TABS
50.0000 mg | ORAL_TABLET | Freq: Every day | ORAL | 3 refills | Status: DC
Start: 2018-03-01 — End: 2019-04-20

## 2018-03-01 NOTE — Progress Notes (Signed)
Subjective:    Patient ID: Holly Horn, female    DOB: 08-18-1970, 47 y.o.   MRN: [id number]  HPI  Patient presents for yearly preventative medicine examination. She is a pleasant 47 year old female who  has a past medical history of Ectopic pregnancy, Essential hypertension, and GERD (gastroesophageal reflux disease).  Essential Hypertension -currently prescribed Cozaar 50 mg tablets BP Readings from Last 3 Encounters:  03/01/18 138/90  02/28/17 118/80  10/05/16 128/74   Anxiety and Depression -currently prescribed Celexa 10 mg- feels as though she is well controlled.   GERD - Controlled with Prilosec 20 mg daily.   Tobacco Use - She continues to work on quitting. She is only smoking when she goes out, which is not very often. She does not smoke when she is at home.   All immunizations and health maintenance protocols were reviewed with the patient and needed orders were placed.  Appropriate screening laboratory values were ordered for the patient including screening of hyperlipidemia, renal function and hepatic function.  Medication reconciliation,  past medical history, social history, problem list and allergies were reviewed in detail with the patient  Goals were established with regard to weight loss, exercise, and  diet in compliance with medications. She started weight watchers two weeks ago and has been able to lose about 5 pounds.  Wt Readings from Last 3 Encounters:  03/01/18 260 lb (117.9 kg)  02/28/17 255 lb (115.7 kg)  10/05/16 262 lb (118.8 kg)   She is due for both her Pap and her mammogram- she has an upcoming appointment with GYN   Review of Systems  Constitutional: Negative.   HENT: Negative.   Eyes: Negative.   Respiratory: Negative.   Cardiovascular: Negative.   Gastrointestinal: Negative.   Endocrine: Negative.   Genitourinary: Negative.   Musculoskeletal: Negative.   Skin: Negative.   Allergic/Immunologic: Negative.   Neurological: Negative.     Hematological: Negative.   Psychiatric/Behavioral: Negative.    Past Medical History:  Diagnosis Date  . Ectopic pregnancy    x 3  . Essential hypertension   . GERD (gastroesophageal reflux disease)     Social History   Socioeconomic History  . Marital status: Significant Other    Spouse name: Not on file  . Number of children: Not on file  . Years of education: Not on file  . Highest education level: Not on file  Occupational History  . Not on file  Social Needs  . Financial resource strain: Not on file  . Food insecurity:    Worry: Not on file    Inability: Not on file  . Transportation needs:    Medical: Not on file    Non-medical: Not on file  Tobacco Use  . Smoking status: Current Some Day Smoker  . Smokeless tobacco: Never Used  Substance and Sexual Activity  . Alcohol use: Yes    Alcohol/week: 3.6 oz    Types: 6 Cans of beer per week  . Drug use: No  . Sexual activity: Not on file  Lifestyle  . Physical activity:    Days per week: Not on file    Minutes per session: Not on file  . Stress: Not on file  Relationships  . Social connections:    Talks on phone: Not on file    Gets together: Not on file    Attends religious service: Not on file    Active member of club or organization: Not on file  Attends meetings of clubs or organizations: Not on file    Relationship status: Not on file  . Intimate partner violence:    Fear of current or ex partner: Not on file    Emotionally abused: Not on file    Physically abused: Not on file    Forced sexual activity: Not on file  Other Topics Concern  . Not on file  Social History Narrative  . Not on file    Past Surgical History:  Procedure Laterality Date  . ECTOPIC PREGNANCY SURGERY    . [facility]  2008 & 2010    Family History  Problem Relation Age of Onset  . Hypertension Mother   . Hypertension Brother   . Osteoarthritis Father   . Breast cancer Other   . Cancer Maternal  Grandmother        unknown  . Cancer Maternal Grandfather        unknown  . Cancer Paternal Grandmother        unknown  . Cancer Paternal Grandfather        unknowon    Allergies  Allergen Reactions  . Lisinopril Cough  . Morphine Sulfate     REACTION: vomiting, SOB  . Progesterone Nausea Only    Current Outpatient Medications on File Prior to Visit  Medication Sig Dispense Refill  . citalopram (CELEXA) 10 MG tablet Take 1 tablet (10 mg total) by mouth daily. 90 tablet 3  . PROAIR HFA 108 (90 Base) MCG/ACT inhaler Inhale 2 puffs into the lungs every 6 (six) hours as needed. for wheezing  0   No current facility-administered medications on file prior to visit.     BP 138/90   Temp 98.4 F (36.9 C) (Oral)   Ht 5' 6.5" (1.689 m)   Wt 260 lb (117.9 kg)   BMI 41.34 kg/m       Objective:   Physical Exam  Constitutional: She is oriented to person, place, and time. She appears well-developed and well-nourished. No distress.  HENT:  Head: Normocephalic and atraumatic.  Right Ear: External ear normal.  Left Ear: External ear normal.  Nose: Nose normal.  Mouth/Throat: Oropharynx is clear and moist. No oropharyngeal exudate.  Eyes: Pupils are equal, round, and reactive to light. Conjunctivae and EOM are normal. Right eye exhibits no discharge. Left eye exhibits no discharge. No scleral icterus.  Neck: Normal range of motion. Neck supple. No JVD present. No tracheal deviation present. No thyromegaly present.  Cardiovascular: Normal rate, regular rhythm, normal heart sounds and intact distal pulses. Exam reveals no gallop and no friction rub.  No murmur heard. Pulmonary/Chest: Effort normal and breath sounds normal. No stridor. No respiratory distress. She has no wheezes. She has no rales. She exhibits no tenderness.  Abdominal: Soft. Bowel sounds are normal. She exhibits no distension and no mass. There is no tenderness. There is no rebound and no guarding. No hernia.    Musculoskeletal: Normal range of motion. She exhibits no edema, tenderness or deformity.  Lymphadenopathy:    She has no cervical adenopathy.  Neurological: She is alert and oriented to person, place, and time. She displays normal reflexes. No cranial nerve deficit. She exhibits normal muscle tone. Coordination normal.  Skin: Skin is warm and dry. Capillary refill takes less than 2 seconds. No rash noted. She is not diaphoretic. No erythema. No pallor.  Psychiatric: She has a normal mood and affect. Her behavior is normal. Judgment and thought content normal.  Nursing note  and vitals reviewed.     Assessment & Plan:  1. Routine general medical examination at a health care facility - She is starting to make life style improvements and was congratulated on this.  - Follow up in one year or sooner if needed - Continue with weight loss  - Basic metabolic panel - CBC with Differential/Platelet - Hepatic function panel - Lipid panel - TSH - Hemoglobin A1c - Vitamin D, 25-hydroxy  2. Essential hypertension - Near goal. No change in medications  - Basic metabolic panel - CBC with Differential/Platelet - Hepatic function panel - Lipid panel - TSH - Hemoglobin A1c  3. TOBACCO ABUSE - Encouraged to quit completely  4. Gastroesophageal reflux disease, esophagitis presence not specified - Controlled with Prilosec; continue with this dose   Dorothyann Peng, NP

## 2018-05-01 ENCOUNTER — Encounter: Payer: Self-pay | Admitting: Family Medicine

## 2018-05-01 ENCOUNTER — Ambulatory Visit: Payer: Self-pay | Admitting: Family Medicine

## 2018-05-01 ENCOUNTER — Ambulatory Visit (INDEPENDENT_AMBULATORY_CARE_PROVIDER_SITE_OTHER): Payer: Managed Care, Other (non HMO)

## 2018-05-01 ENCOUNTER — Other Ambulatory Visit: Payer: Self-pay | Admitting: Adult Health

## 2018-05-01 ENCOUNTER — Encounter: Payer: Self-pay | Admitting: Adult Health

## 2018-05-01 ENCOUNTER — Ambulatory Visit: Payer: Managed Care, Other (non HMO) | Admitting: Family Medicine

## 2018-05-01 VITALS — BP 138/90 | HR 64 | Temp 97.7°F | Wt 264.5 lb

## 2018-05-01 DIAGNOSIS — S8392XA Sprain of unspecified site of left knee, initial encounter: Secondary | ICD-10-CM

## 2018-05-01 DIAGNOSIS — M25562 Pain in left knee: Secondary | ICD-10-CM | POA: Diagnosis not present

## 2018-05-01 IMAGING — DX DG KNEE 1-2V*L*
2 series · 2 of 2 positions shown · non-contrast
Comparison: None.

CLINICAL DATA: Medial left knee pain and swelling after a fall from
a ladder 2 weeks ago.

EXAM:
LEFT KNEE - 1-2 VIEW

[knee ap]
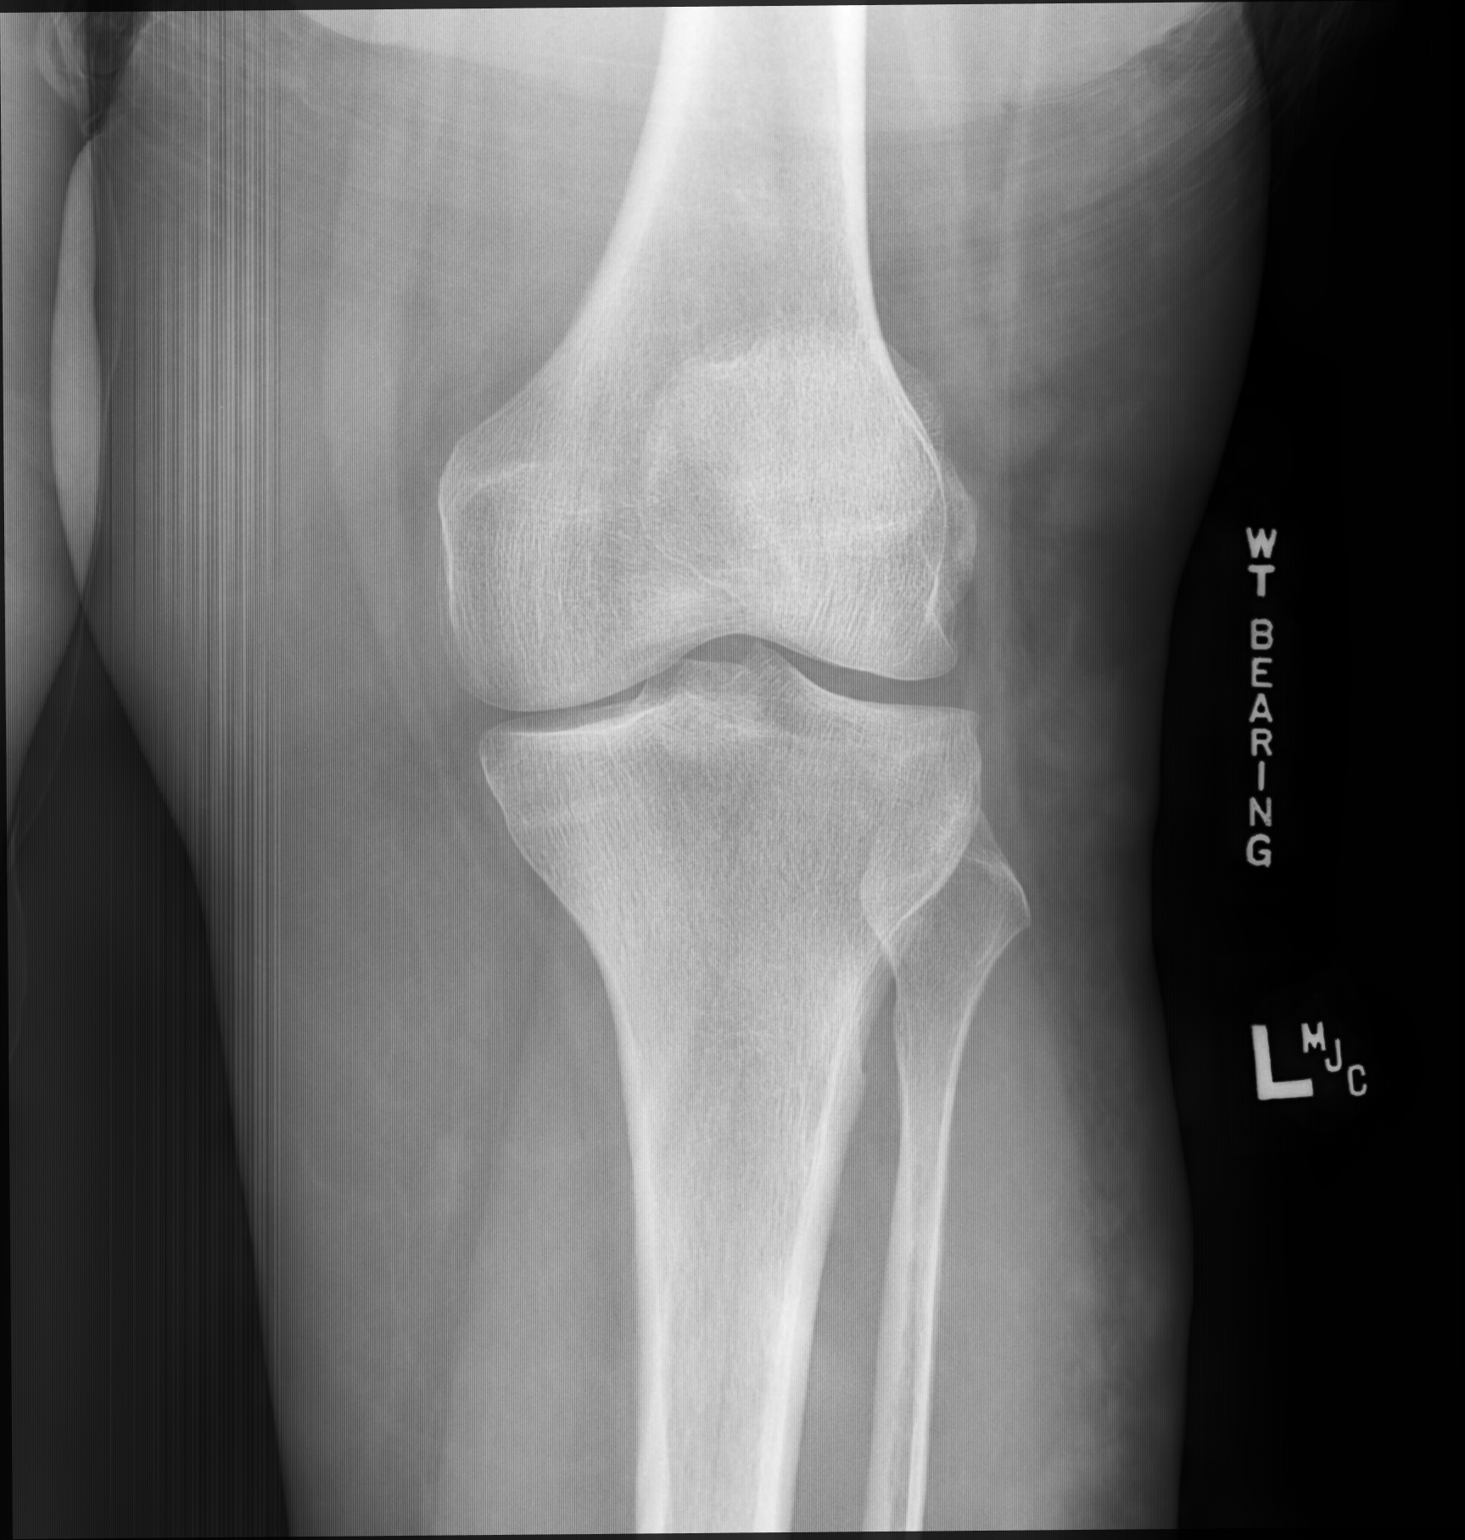

[knee lat]
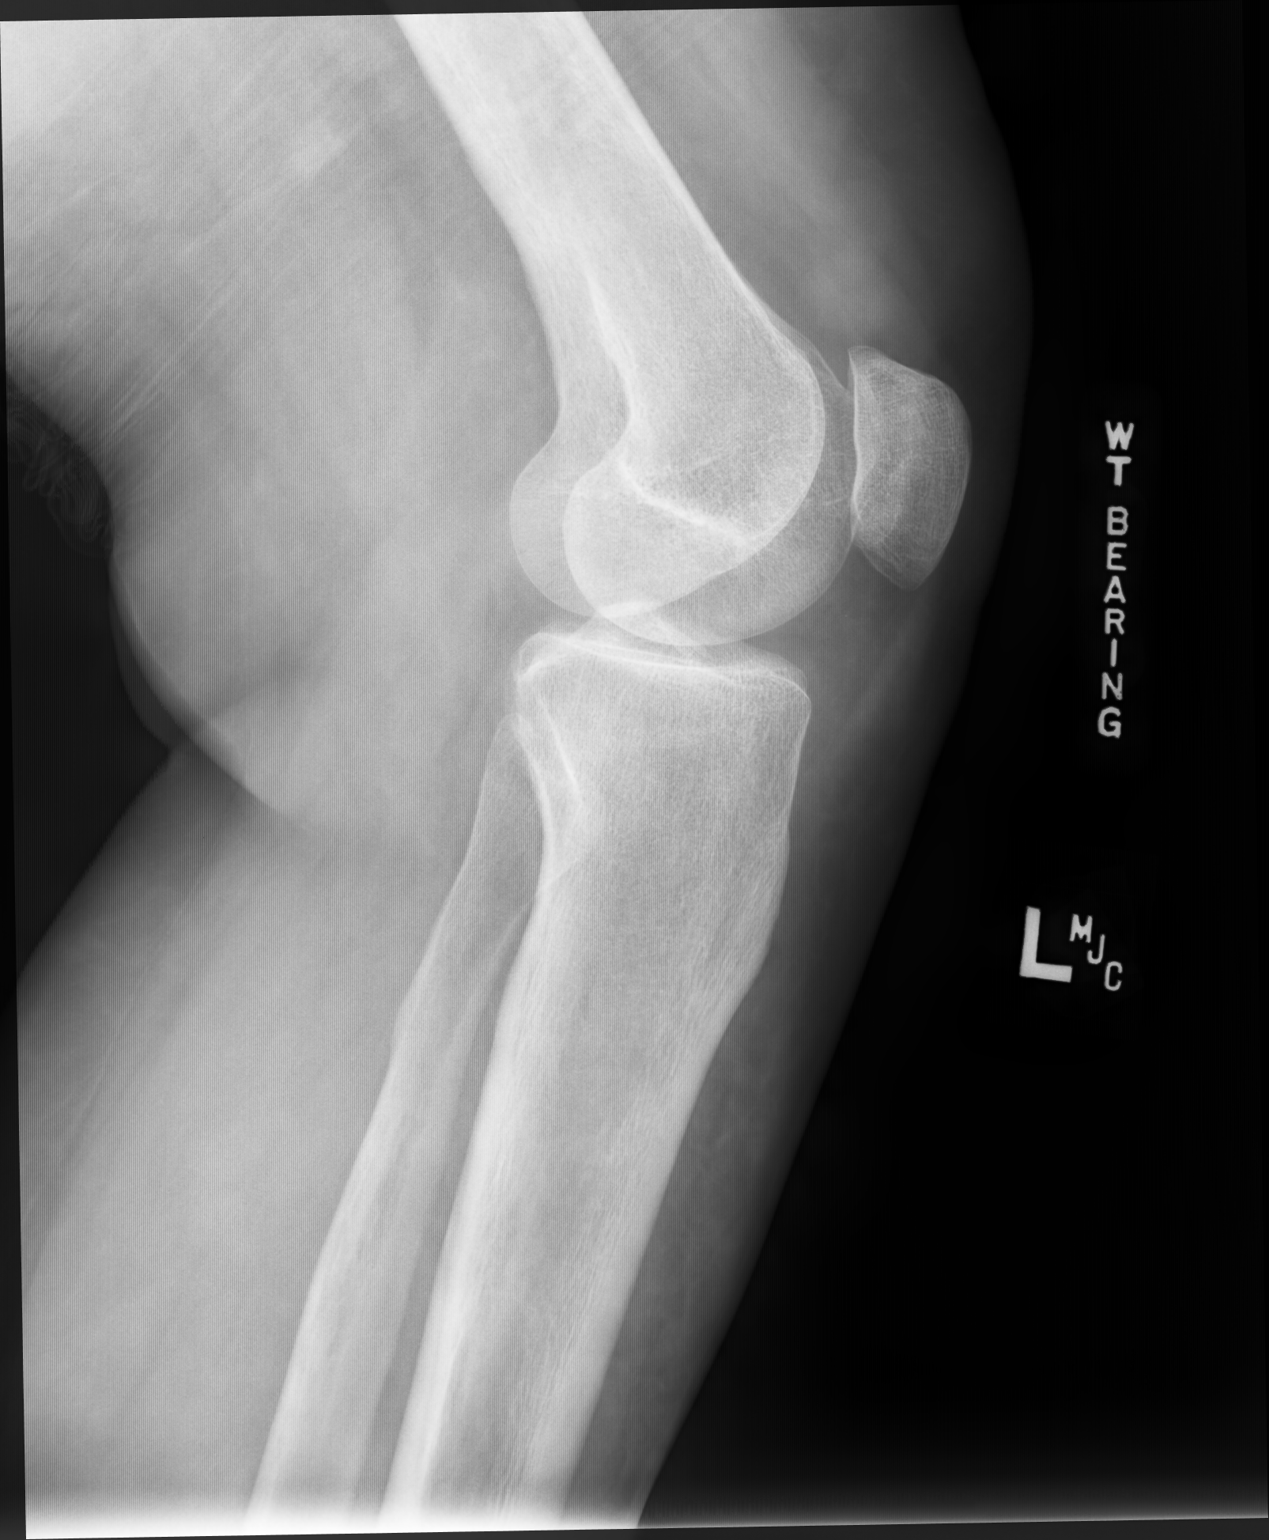

[2 of 2 positions shown; findings below may reference images not displayed]

FINDINGS: The bones are subjectively adequately mineralized. There is mild
narrowing of the medial joint space. There is beaking of the tibial
spines. There is no acute or healing fracture. There is no definite
joint effusion.
IMPRESSION: Very mild narrowing of the medial joint compartment with beaking of
the tibial spines likely reflects early osteo arthritis. No acute or
healing fracture.

## 2018-05-01 NOTE — Progress Notes (Signed)
   Subjective:    Patient ID: Holly Horn, female    DOB: 18-May-1971, 47 y.o.   MRN: [id number]  HPI Here for an injury to the left knee that occurred at home about 2 weeks ago. She was standing on a small step ladder to hang a flag on her house when she fell, and the left leg was caught between the steps on the ladder. Since then she has had swelling in the knee and she has pain in the medial knee. No locking or giving way. Using ice and Ibuprofen.    Review of Systems  Constitutional: Negative.   Respiratory: Negative.   Cardiovascular: Negative.   Musculoskeletal: Positive for arthralgias and joint swelling.       Objective:   Physical Exam  Constitutional: She appears well-developed and well-nourished.  Walks with a limp   Cardiovascular: Normal rate, regular rhythm, normal heart sounds and intact distal pulses.  Pulmonary/Chest: Effort normal and breath sounds normal.  Musculoskeletal:  The left knee is mildly swollen. The entire left lower leg is mildly swollen. No calf tenderness and no cords are felt. The knee is very tender along the medial joint space. ROM is full, no crepitus. Anterior drawer is negative. On my review of the Xrays taken today, I see no fractures or dislocations           Assessment & Plan:  Knee sprain, likely a medial meniscal tear. She will wear a supportive brace. Refer to Orthopedics.  Alysia Penna, MD

## 2018-05-03 ENCOUNTER — Encounter: Payer: Self-pay | Admitting: *Deleted

## 2018-05-24 ENCOUNTER — Other Ambulatory Visit: Payer: Self-pay | Admitting: Adult Health

## 2018-05-24 NOTE — Telephone Encounter (Signed)
Sent to the pharmacy by e-scribe. 

## 2018-09-06 ENCOUNTER — Telehealth: Payer: Managed Care, Other (non HMO) | Admitting: Family

## 2018-09-06 DIAGNOSIS — J069 Acute upper respiratory infection, unspecified: Secondary | ICD-10-CM | POA: Diagnosis not present

## 2018-09-06 DIAGNOSIS — B9789 Other viral agents as the cause of diseases classified elsewhere: Secondary | ICD-10-CM

## 2018-09-06 MED ORDER — BENZONATATE 100 MG PO CAPS: 100.0000 mg | ORAL_CAPSULE | Freq: Three times a day (TID) | ORAL | 0 refills | Status: DC | PRN

## 2018-09-06 MED ORDER — FLUTICASONE PROPIONATE 50 MCG/ACT NA SUSP: 2.0000 | Freq: Every day | NASAL | 6 refills | Status: DC

## 2018-09-06 NOTE — Progress Notes (Signed)
We are sorry you are not feeling well.  Here is how we plan to help!  Based on what you have shared with me, it looks like you may have a viral upper respiratory infection or a "common cold".  Colds are caused by a large number of viruses; however, rhinovirus is the most common cause.   Symptoms of the common cold vary from person to person, with common symptoms including sore throat, cough, and malaise.  A low-grade fever of 100.4 may present, but is often uncommon.  Symptoms vary however, and are closely related to a person's age or underlying illnesses.  The most common symptoms associated with the common cold are nasal discharge or congestion, cough, sneezing, headache and pressure in the ears and face.  Cold symptoms usually persist for about 3 to 10 days, but can last up to 2 weeks.  It is important to know that colds do not cause serious illness or complications in most cases.    The common cold is transmitted from person to person, with the most common method of transmission being a person's hands.  The virus is able to live on the skin and can infect other persons for up to 2 hours after direct contact.  Also, colds are transmitted when someone coughs or sneezes; thus, it is important to cover the mouth to reduce this risk.  To keep the spread of the common cold at [place], good hand hygiene is very important.  This is an infection that is most likely caused by a virus. There are no specific treatments for the common cold other than to help you with the symptoms until the infection runs its course.    For nasal congestion, you may use an oral decongestants such as Mucinex D or if you have glaucoma or high blood pressure use plain Mucinex.  Saline nasal spray or nasal drops can help and can safely be used as often as needed for congestion.  For your congestion, I have prescribed Fluticasone nasal spray one spray in each nostril twice a day  If you do not have a history of heart disease, hypertension,  diabetes or thyroid disease, prostate/bladder issues or glaucoma, you may also use Sudafed to treat nasal congestion.  It is highly recommended that you consult with a pharmacist or your primary care physician to ensure this medication is safe for you to take.     If you have a cough, you may use cough suppressants such as Delsym and Robitussin.  If you have glaucoma or high blood pressure, you can also use Coricidin HBP.   For cough I have prescribed for you A prescription cough medication called Tessalon Perles 100 mg. You may take 1-2 capsules every 8 hours as needed for cough  If you have a sore or scratchy throat, use a saltwater gargle-  to  teaspoon of salt dissolved in a 4-ounce to 8-ounce glass of warm water.  Gargle the solution for approximately 15-30 seconds and then spit.  It is important not to swallow the solution.  You can also use throat lozenges/cough drops and Chloraseptic spray to help with throat pain or discomfort.  Warm or cold liquids can also be helpful in relieving throat pain.  For headache, pain or general discomfort, you can use Ibuprofen or Tylenol as directed.   Some authorities believe that zinc sprays or the use of Echinacea may shorten the course of your symptoms.   HOME CARE . Only take medications as instructed by your  medical team. . Be sure to drink plenty of fluids. Water is fine as well as fruit juices, sodas and electrolyte beverages. You may want to stay away from caffeine or alcohol. If you are nauseated, try taking small sips of liquids. How do you know if you are getting enough fluid? Your urine should be a pale yellow or almost colorless. . Get rest. . Taking a steamy shower or using a humidifier may help nasal congestion and ease sore throat pain. You can place a towel over your head and breathe in the steam from hot water coming from a faucet. . Using a saline nasal spray works much the same way. . Cough drops, hard candies and sore throat lozenges  may ease your cough. . Avoid close contacts especially the very young and the elderly . Cover your mouth if you cough or sneeze . Always remember to wash your hands.   GET HELP RIGHT AWAY IF: . You develop worsening fever. . If your symptoms do not improve within 10 days . You develop yellow or green discharge from your nose over 3 days. . You have coughing fits . You develop a severe head ache or visual changes. . You develop shortness of breath, difficulty breathing or start having chest pain . Your symptoms persist after you have completed your treatment plan  MAKE SURE YOU   Understand these instructions.  Will watch your condition.  Will get help right away if you are not doing well or get worse.  Your e-visit answers were reviewed by a board certified advanced clinical practitioner to complete your personal care plan. Depending upon the condition, your plan could have included both over the counter or prescription medications. Please review your pharmacy choice. If there is a problem, you may call our nursing hot line at and have the prescription routed to another pharmacy. Your safety is important to [place]. If you have drug allergies check your prescription carefully.   You can use MyChart to ask questions about today's visit, request a non-urgent call back, or ask for a work or school excuse for 24 hours related to this e-Visit. If it has been greater than 24 hours you will need to follow up with your provider, or enter a new e-Visit to address those concerns. You will get an e-mail in the next two days asking about your experience.  I hope that your e-visit has been valuable and will speed your recovery. Thank you for using e-visits.

## 2019-01-02 ENCOUNTER — Encounter: Payer: Self-pay | Admitting: Adult Health

## 2019-02-16 ENCOUNTER — Encounter: Payer: Self-pay | Admitting: Adult Health

## 2019-02-16 DIAGNOSIS — K219 Gastro-esophageal reflux disease without esophagitis: Secondary | ICD-10-CM

## 2019-02-16 MED ORDER — OMEPRAZOLE 20 MG PO CPDR: 20.0000 mg | DELAYED_RELEASE_CAPSULE | Freq: Every day | ORAL | 0 refills | Status: DC

## 2019-03-01 ENCOUNTER — Encounter: Payer: Self-pay | Admitting: Family Medicine

## 2019-03-01 ENCOUNTER — Other Ambulatory Visit: Payer: Self-pay | Admitting: Adult Health

## 2019-03-01 NOTE — Telephone Encounter (Signed)
Sent to the pharmacy by e-scribe for 90 days.  Letter released to MyChart. 

## 2019-04-20 ENCOUNTER — Encounter: Payer: Self-pay | Admitting: Adult Health

## 2019-04-20 DIAGNOSIS — I10 Essential (primary) hypertension: Secondary | ICD-10-CM

## 2019-04-20 MED ORDER — LOSARTAN POTASSIUM 50 MG PO TABS: 50.0000 mg | ORAL_TABLET | Freq: Every day | ORAL | 0 refills | Status: DC

## 2019-04-24 ENCOUNTER — Encounter: Payer: Self-pay | Admitting: Adult Health

## 2019-04-24 ENCOUNTER — Ambulatory Visit (INDEPENDENT_AMBULATORY_CARE_PROVIDER_SITE_OTHER): Payer: Managed Care, Other (non HMO) | Admitting: Adult Health

## 2019-04-24 ENCOUNTER — Other Ambulatory Visit: Payer: Self-pay

## 2019-04-24 VITALS — BP 120/90 | HR 75 | Temp 98.5°F | Ht 66.5 in | Wt 281.4 lb

## 2019-04-24 DIAGNOSIS — F32A Depression, unspecified: Secondary | ICD-10-CM

## 2019-04-24 DIAGNOSIS — K219 Gastro-esophageal reflux disease without esophagitis: Secondary | ICD-10-CM | POA: Diagnosis not present

## 2019-04-24 DIAGNOSIS — F419 Anxiety disorder, unspecified: Secondary | ICD-10-CM

## 2019-04-24 DIAGNOSIS — I10 Essential (primary) hypertension: Secondary | ICD-10-CM

## 2019-04-24 DIAGNOSIS — Z0000 Encounter for general adult medical examination without abnormal findings: Secondary | ICD-10-CM

## 2019-04-24 DIAGNOSIS — Z23 Encounter for immunization: Secondary | ICD-10-CM

## 2019-04-24 DIAGNOSIS — Z6841 Body Mass Index (BMI) 40.0 and over, adult: Secondary | ICD-10-CM | POA: Diagnosis not present

## 2019-04-24 DIAGNOSIS — E66813 Obesity, class 3: Secondary | ICD-10-CM

## 2019-04-24 DIAGNOSIS — F17200 Nicotine dependence, unspecified, uncomplicated: Secondary | ICD-10-CM

## 2019-04-24 DIAGNOSIS — F329 Major depressive disorder, single episode, unspecified: Secondary | ICD-10-CM

## 2019-04-24 LAB — CBC WITH DIFFERENTIAL/PLATELET
Basophils Absolute: 0.1 10*3/uL (ref 0.0–0.1)
Basophils Relative: 0.9 % (ref 0.0–3.0)
Eosinophils Absolute: 0.1 10*3/uL (ref 0.0–0.7)
Eosinophils Relative: 1.9 % (ref 0.0–5.0)
HCT: 37.9 % (ref 36.0–46.0)
Hemoglobin: 12.3 g/dL (ref 12.0–15.0)
Lymphocytes Relative: 35 % (ref 12.0–46.0)
Lymphs Abs: 2.3 10*3/uL (ref 0.7–4.0)
MCHC: 32.4 g/dL (ref 30.0–36.0)
MCV: 88.5 fl (ref 78.0–100.0)
Monocytes Absolute: 0.4 10*3/uL (ref 0.1–1.0)
Monocytes Relative: 6 % (ref 3.0–12.0)
Neutro Abs: 3.6 10*3/uL (ref 1.4–7.7)
Neutrophils Relative %: 56.2 % (ref 43.0–77.0)
Platelets: 294 10*3/uL (ref 150.0–400.0)
RBC: 4.29 Mil/uL (ref 3.87–5.11)
RDW: 14.5 % (ref 11.5–15.5)
WBC: 6.5 10*3/uL (ref 4.0–10.5)

## 2019-04-24 LAB — COMPREHENSIVE METABOLIC PANEL
ALT: 59 U/L — ABNORMAL HIGH (ref 0–35)
AST: 49 U/L — ABNORMAL HIGH (ref 0–37)
Albumin: 3.8 g/dL (ref 3.5–5.2)
Alkaline Phosphatase: 84 U/L (ref 39–117)
BUN: 12 mg/dL (ref 6–23)
CO2: 30 mEq/L (ref 19–32)
Calcium: 9.3 mg/dL (ref 8.4–10.5)
Chloride: 103 mEq/L (ref 96–112)
Creatinine, Ser: 0.68 mg/dL (ref 0.40–1.20)
GFR: 91.9 mL/min (ref >60.00)
Glucose, Bld: 96 mg/dL (ref 70–99)
Potassium: 4.4 mEq/L (ref 3.5–5.1)
Sodium: 140 mEq/L (ref 135–145)
Total Bilirubin: 0.5 mg/dL (ref 0.2–1.2)
Total Protein: 6.3 g/dL (ref 6.0–8.3)

## 2019-04-24 LAB — LIPID PANEL
Cholesterol: 176 mg/dL (ref 0–200)
HDL: 52.7 mg/dL (ref >39.00)
LDL Cholesterol: 99 mg/dL (ref 0–99)
NonHDL: 123.43
Total CHOL/HDL Ratio: 3
Triglycerides: 122 mg/dL (ref 0.0–149.0)
VLDL: 24.4 mg/dL (ref 0.0–40.0)

## 2019-04-24 LAB — TSH: TSH: 2.19 u[IU]/mL (ref 0.35–4.50)

## 2019-04-24 LAB — HEMOGLOBIN A1C: Hgb A1c MFr Bld: 5.9 % (ref 4.6–6.5)

## 2019-04-24 NOTE — Patient Instructions (Signed)
It was great seeing you today   We will follow up with you regarding your blood work   Please follow up in one month for weight loss management - I would like to see you in the office for this visit

## 2019-04-24 NOTE — Progress Notes (Signed)
Subjective:    Patient ID: Holly Horn, female    DOB: 07/12/1971, 48 y.o.   MRN: [id number]  HPI Patient presents for yearly preventative medicine examination. She is a pleasant 48 year old female who  has a past medical history of Ectopic pregnancy, Essential hypertension, and GERD (gastroesophageal reflux disease).  Essential Hypertension - Currently prescribed Cozaar 50 mg daily.  She denies headaches, blurred vision, dizziness, lightheadedness, chest pain, or shortness of breath. BP Readings from Last 3 Encounters:  04/24/19 120/90  05/01/18 138/90  03/01/18 138/90   Anxiety and Depression -currently prescribed Celexa 10 mg.  She feels as though her symptoms are well controlled on this dose.  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 She does not smoke at home or in the car. Her current pack has lasted her two weeks.   All immunizations and health maintenance protocols were reviewed with the patient and needed orders were placed.  Appropriate screening laboratory values were ordered for the patient including screening of hyperlipidemia, renal function and hepatic function.  Medication reconciliation,  past medical history, social history, problem list and allergies were reviewed in detail with the patient  Goals were established with regard to weight loss, exercise, and  diet in compliance with medications. She is not exercising and has not been going to her weight watcher meetings. She has not been eating healthy. She would like to try phentermine again. She does report feeling fatigued since she put on the weight  Wt Readings from Last 3 Encounters:  04/24/19 281 lb 6.4 oz (127.6 kg)  05/01/18 264 lb 8 oz (120 kg)  03/01/18 260 lb (117.9 kg)   She is up to date on her mammogram   Review of Systems  Constitutional: Positive for fatigue.  HENT: Negative.   Eyes: Negative.    Respiratory: Negative.   Cardiovascular: Negative.   Gastrointestinal: Negative.   Endocrine: Negative.   Genitourinary: Negative.   Musculoskeletal: Negative.   Skin: Negative.   Allergic/Immunologic: Negative.   Neurological: Negative.   Hematological: Negative.   Psychiatric/Behavioral: Negative.    Past Medical History:  Diagnosis Date  . Ectopic pregnancy    x 3  . Essential hypertension   . GERD (gastroesophageal reflux disease)     Social History   Socioeconomic History  . Marital status: Significant Other    Spouse name: Not on file  . Number of children: Not on file  . Years of education: Not on file  . Highest education level: Not on file  Occupational History  . Not on file  Social Needs  . Financial resource strain: Not on file  . Food insecurity    Worry: Not on file    Inability: Not on file  . Transportation needs    Medical: Not on file    Non-medical: Not on file  Tobacco Use  . Smoking status: Current Some Day Smoker  . Smokeless tobacco: Never Used  Substance and Sexual Activity  . Alcohol use: Yes    Alcohol/week: 6.0 standard drinks    Types: 6 Cans of beer per week  . Drug use: No  . Sexual activity: Not on file  Lifestyle  . Physical activity    Days per week: Not on file    Minutes per session: Not on file  . Stress: Not on file  Relationships  . Social  connections    Talks on phone: Not on file    Gets together: Not on file    Attends religious service: Not on file    Active member of club or organization: Not on file    Attends meetings of clubs or organizations: Not on file    Relationship status: Not on file  . Intimate partner violence    Fear of current or ex partner: Not on file    Emotionally abused: Not on file    Physically abused: Not on file    Forced sexual activity: Not on file  Other Topics Concern  . Not on file  Social History Narrative  . Not on file    Past Surgical History:  Procedure Laterality Date   . ECTOPIC PREGNANCY SURGERY    . [facility]  2008 & 2010    Family History  Problem Relation Age of Onset  . Hypertension Mother   . Hypertension Brother   . Osteoarthritis Father   . Breast cancer Other   . Cancer Maternal Grandmother        unknown  . Cancer Maternal Grandfather        unknown  . Cancer Paternal Grandmother        unknown  . Cancer Paternal Grandfather        unknowon    Allergies  Allergen Reactions  . Lisinopril Cough  . Morphine Sulfate     REACTION: vomiting, SOB  . Progesterone Nausea Only    Current Outpatient Medications on File Prior to Visit  Medication Sig Dispense Refill  . citalopram (CELEXA) 10 MG tablet TAKE 1 TABLET BY MOUTH EVERY DAY 90 tablet 0  . fluticasone (FLONASE) 50 MCG/ACT nasal spray Place 2 sprays into both nostrils daily. 16 g 6  . losartan (COZAAR) 50 MG tablet Take 1 tablet (50 mg total) by mouth daily. 90 tablet 0  . omeprazole (PRILOSEC) 20 MG capsule Take 1 capsule (20 mg total) by mouth daily. 90 capsule 0  . PROAIR HFA 108 (90 Base) MCG/ACT inhaler Inhale 2 puffs into the lungs every 6 (six) hours as needed. for wheezing  0   No current facility-administered medications on file prior to visit.     BP 120/90 (BP Location: Left Arm, Patient Position: Sitting, Cuff Size: Large)   Pulse 75   Temp 98.5 F (36.9 C) (Temporal)   Ht 5' 6.5" (1.689 m)   Wt 281 lb 6.4 oz (127.6 kg)   SpO2 94%   BMI 44.74 kg/m       Objective:   Physical Exam Vitals signs and nursing note reviewed.  Constitutional:      General: She is not in acute distress.    Appearance: She is obese. She is not diaphoretic.  HENT:     Head: Normocephalic and atraumatic.     Right Ear: Tympanic membrane, ear canal and external ear normal. There is no impacted cerumen.     Left Ear: Tympanic membrane, ear canal and external ear normal. There is no impacted cerumen.     Nose: Nose normal. No congestion or rhinorrhea.     Mouth/Throat:      Mouth: Mucous membranes are moist.     Pharynx: Oropharynx is clear. No oropharyngeal exudate or posterior oropharyngeal erythema.  Eyes:     General: No scleral icterus.       Right eye: No discharge.        Left eye: No discharge.  Conjunctiva/sclera: Conjunctivae normal.     Pupils: Pupils are equal, round, and reactive to light.  Neck:     Musculoskeletal: Normal range of motion and neck supple.     Thyroid: No thyromegaly.     Vascular: No JVD.     Trachea: No tracheal deviation.  Cardiovascular:     Rate and Rhythm: Normal rate and regular rhythm.     Heart sounds: Normal heart sounds. No murmur. No friction rub. No gallop.   Pulmonary:     Effort: Pulmonary effort is normal. No respiratory distress.     Breath sounds: Normal breath sounds. No stridor. No wheezing, rhonchi or rales.  Chest:     Chest wall: No tenderness.  Abdominal:     General: Bowel sounds are normal. There is no distension.     Palpations: Abdomen is soft. There is no mass.     Tenderness: There is no abdominal tenderness. There is no right CVA tenderness, left CVA tenderness, guarding or rebound.     Hernia: No hernia is present.  Musculoskeletal: Normal range of motion.        General: No swelling, tenderness, deformity or signs of injury.     Right lower leg: No edema.     Left lower leg: No edema.  Lymphadenopathy:     Cervical: No cervical adenopathy.  Skin:    General: Skin is warm and dry.     Capillary Refill: Capillary refill takes less than 2 seconds.     Coloration: Skin is not jaundiced or pale.     Findings: No bruising, erythema, lesion or rash.  Neurological:     General: No focal deficit present.     Mental Status: She is alert and oriented to person, place, and time. Mental status is at baseline.     Cranial Nerves: No cranial nerve deficit.     Sensory: No sensory deficit.     Motor: No weakness or abnormal muscle tone.     Coordination: Coordination normal.     Gait: Gait  normal.     Deep Tendon Reflexes: Reflexes normal.  Psychiatric:        Mood and Affect: Mood normal.        Behavior: Behavior normal.        Thought Content: Thought content normal.        Judgment: Judgment normal.       Assessment & Plan:  1. Routine general medical examination at a health care facility - Needs to work on weight loss through diet and exercise  - Needs to quit smoking  - Follow up in one year or sooner if needed  - CBC with Differential/Platelet - Comprehensive metabolic panel - Lipid panel - TSH - Hemoglobin A1c  2. Essential hypertension - Well controlled on Cozaar  - CBC with Differential/Platelet - Comprehensive metabolic panel - Lipid panel - TSH - Hemoglobin A1c  3. Gastroesophageal reflux disease, esophagitis presence not specified - Well controlled on prilosec   4. TOBACCO ABUSE - Encouraged to quit smoking   5. Class 3 severe obesity with serious comorbidity and body mass index (BMI) of 40.0 to 44.9 in adult, unspecified obesity type ([place]) - She will follow up in one month. Needs to get serious about weight loss through diet and exercise before phentermine will be prescribed  - CBC with Differential/Platelet - Comprehensive metabolic panel - Lipid panel - TSH - Hemoglobin A1c  6. Anxiety and depression - Well controlled with Celexa 10  mg    Dorothyann Peng, NP

## 2019-05-14 ENCOUNTER — Other Ambulatory Visit: Payer: Self-pay | Admitting: Adult Health

## 2019-05-14 DIAGNOSIS — K219 Gastro-esophageal reflux disease without esophagitis: Secondary | ICD-10-CM

## 2019-05-15 NOTE — Telephone Encounter (Signed)
Sent to the pharmacy by e-scribe. 

## 2019-05-24 ENCOUNTER — Ambulatory Visit: Payer: Managed Care, Other (non HMO) | Admitting: Adult Health

## 2019-05-24 DIAGNOSIS — Z0289 Encounter for other administrative examinations: Secondary | ICD-10-CM

## 2019-05-24 NOTE — Progress Notes (Deleted)
Subjective:    Patient ID: Holly Horn, female    DOB: [birth year], 48 y.o.   MRN: [id number]  HPI 48 year old female who  has a past medical history of Ectopic pregnancy, Essential hypertension, and GERD (gastroesophageal reflux disease).  She presents to the office today for 1 month follow-up regarding weight loss management.  During her last visit she was interested in starting phentermine to help aid her in weight loss.  At this time she was not eating healthy nor was she exercising.  She was advised that before phentermine would be prescribed that she would need to start an exercise regimen and start eating healthy.    Today on follow-up she reports that  Wt Readings from Last 3 Encounters:  04/24/19 281 lb 6.4 oz (127.6 kg)  05/01/18 264 lb 8 oz (120 kg)  03/01/18 260 lb (117.9 kg)    Review of Systems See HPI   Past Medical History:  Diagnosis Date  . Ectopic pregnancy    x 3  . Essential hypertension   . GERD (gastroesophageal reflux disease)     Social History   Socioeconomic History  . Marital status: Significant Other    Spouse name: Not on file  . Number of children: Not on file  . Years of education: Not on file  . Highest education level: Not on file  Occupational History  . Not on file  Social Needs  . Financial resource strain: Not on file  . Food insecurity    Worry: Not on file    Inability: Not on file  . Transportation needs    Medical: Not on file    Non-medical: Not on file  Tobacco Use  . Smoking status: Current Some Day Smoker  . Smokeless tobacco: Never Used  Substance and Sexual Activity  . Alcohol use: Yes    Alcohol/week: 6.0 standard drinks    Types: 6 Cans of beer per week  . Drug use: No  . Sexual activity: Not on file  Lifestyle  . Physical activity    Days per week: Not on file    Minutes per session: Not on file  . Stress: Not on file  Relationships  . Social Herbalist on phone: Not on file    Gets  together: Not on file    Attends religious service: Not on file    Active member of club or organization: Not on file    Attends meetings of clubs or organizations: Not on file    Relationship status: Not on file  . Intimate partner violence    Fear of current or ex partner: Not on file    Emotionally abused: Not on file    Physically abused: Not on file    Forced sexual activity: Not on file  Other Topics Concern  . Not on file  Social History Narrative  . Not on file    Past Surgical History:  Procedure Laterality Date  . ECTOPIC PREGNANCY SURGERY    . [facility]  2008 & 2010    Family History  Problem Relation Age of Onset  . Hypertension Mother   . Hypertension Brother   . Osteoarthritis Father   . Breast cancer Other   . Cancer Maternal Grandmother        unknown  . Cancer Maternal Grandfather        unknown  . Cancer Paternal Grandmother        unknown  .  Cancer Paternal Grandfather        unknowon    Allergies  Allergen Reactions  . Lisinopril Cough  . Morphine Sulfate     REACTION: vomiting, SOB  . Progesterone Nausea Only    Current Outpatient Medications on File Prior to Visit  Medication Sig Dispense Refill  . citalopram (CELEXA) 10 MG tablet TAKE 1 TABLET BY MOUTH EVERY DAY 90 tablet 0  . fluticasone (FLONASE) 50 MCG/ACT nasal spray Place 2 sprays into both nostrils daily. 16 g 6  . losartan (COZAAR) 50 MG tablet Take 1 tablet (50 mg total) by mouth daily. 90 tablet 0  . omeprazole (PRILOSEC) 20 MG capsule TAKE 1 CAPSULE BY MOUTH EVERY DAY 90 capsule 3  . PROAIR HFA 108 (90 Base) MCG/ACT inhaler Inhale 2 puffs into the lungs every 6 (six) hours as needed. for wheezing  0   No current facility-administered medications on file prior to visit.     There were no vitals taken for this visit.      Objective:   Physical Exam Vitals signs and nursing note reviewed.  Constitutional:      Appearance: She is obese.  Cardiovascular:      Rate and Rhythm: Normal rate and regular rhythm.     Pulses: Normal pulses.     Heart sounds: Normal heart sounds.  Pulmonary:     Effort: Pulmonary effort is normal.     Breath sounds: Normal breath sounds.  Musculoskeletal: Normal range of motion.  Skin:    General: Skin is warm and dry.  Neurological:     General: No focal deficit present.     Mental Status: She is alert and oriented to person, place, and time.  Psychiatric:        Mood and Affect: Mood normal.        Behavior: Behavior normal.        Thought Content: Thought content normal.        Judgment: Judgment normal.       Assessment & Plan:

## 2019-05-28 ENCOUNTER — Other Ambulatory Visit: Payer: Self-pay | Admitting: Adult Health

## 2019-07-16 ENCOUNTER — Other Ambulatory Visit: Payer: Self-pay | Admitting: Adult Health

## 2019-07-16 DIAGNOSIS — I10 Essential (primary) hypertension: Secondary | ICD-10-CM

## 2019-07-17 NOTE — Telephone Encounter (Signed)
Sent to the pharmacy by e-scribe. 

## 2019-08-07 ENCOUNTER — Encounter: Payer: Self-pay | Admitting: Adult Health

## 2019-08-13 ENCOUNTER — Encounter: Payer: Self-pay | Admitting: Adult Health

## 2019-08-14 ENCOUNTER — Encounter: Payer: Self-pay | Admitting: Adult Health

## 2019-11-07 ENCOUNTER — Encounter: Payer: Self-pay | Admitting: Adult Health

## 2020-04-07 LAB — HM PAP SMEAR

## 2020-04-08 ENCOUNTER — Encounter: Payer: Self-pay | Admitting: Adult Health

## 2020-05-02 ENCOUNTER — Encounter: Payer: Self-pay | Admitting: Gastroenterology

## 2020-05-02 ENCOUNTER — Ambulatory Visit (INDEPENDENT_AMBULATORY_CARE_PROVIDER_SITE_OTHER): Payer: PRIVATE HEALTH INSURANCE | Admitting: Adult Health

## 2020-05-02 ENCOUNTER — Other Ambulatory Visit: Payer: Self-pay

## 2020-05-02 ENCOUNTER — Encounter: Payer: Self-pay | Admitting: Adult Health

## 2020-05-02 VITALS — BP 130/78 | HR 96 | Ht 66.5 in | Wt 289.2 lb

## 2020-05-02 DIAGNOSIS — Z0000 Encounter for general adult medical examination without abnormal findings: Secondary | ICD-10-CM

## 2020-05-02 DIAGNOSIS — K219 Gastro-esophageal reflux disease without esophagitis: Secondary | ICD-10-CM

## 2020-05-02 DIAGNOSIS — F17200 Nicotine dependence, unspecified, uncomplicated: Secondary | ICD-10-CM

## 2020-05-02 DIAGNOSIS — I10 Essential (primary) hypertension: Secondary | ICD-10-CM

## 2020-05-02 DIAGNOSIS — Z23 Encounter for immunization: Secondary | ICD-10-CM | POA: Diagnosis not present

## 2020-05-02 DIAGNOSIS — F329 Major depressive disorder, single episode, unspecified: Secondary | ICD-10-CM

## 2020-05-02 DIAGNOSIS — Z1211 Encounter for screening for malignant neoplasm of colon: Secondary | ICD-10-CM

## 2020-05-02 DIAGNOSIS — F32A Depression, unspecified: Secondary | ICD-10-CM

## 2020-05-02 DIAGNOSIS — F419 Anxiety disorder, unspecified: Secondary | ICD-10-CM

## 2020-05-02 DIAGNOSIS — Z6841 Body Mass Index (BMI) 40.0 and over, adult: Secondary | ICD-10-CM

## 2020-05-02 MED ORDER — LOSARTAN POTASSIUM 50 MG PO TABS: 50.0000 mg | ORAL_TABLET | Freq: Every day | ORAL | 3 refills | Status: DC

## 2020-05-02 MED ORDER — OMEPRAZOLE 20 MG PO CPDR: 20.0000 mg | DELAYED_RELEASE_CAPSULE | Freq: Every day | ORAL | 3 refills | Status: DC

## 2020-05-02 MED ORDER — CITALOPRAM HYDROBROMIDE 20 MG PO TABS: 20.0000 mg | ORAL_TABLET | Freq: Every day | ORAL | 1 refills | Status: DC

## 2020-05-02 MED ORDER — PHENTERMINE HCL 15 MG PO CAPS: 15.0000 mg | ORAL_CAPSULE | ORAL | 0 refills | Status: DC

## 2020-05-02 NOTE — Progress Notes (Signed)
Subjective:    Patient ID: Holly Horn, female    DOB: [birth year], 49 y.o.   MRN: [id number]  HPI Patient presents for yearly preventative medicine examination. She is a pleasant 49 year old female who  has a past medical history of Ectopic pregnancy, Essential hypertension, and GERD (gastroesophageal reflux disease).  Essential hypertension-currently prescribed Cozaar 50 mg daily.  She denies headaches, blurred vision, dizziness, lightheadedness, chest pain, shortness of breath BP Readings from Last 3 Encounters:  05/02/20 130/78  04/24/19 120/90  05/01/18 138/90    Anxiety/depression-is currently prescribed Celexa 10 mg.  She feels as though with her work schedule and her father who is in poor health she has been more anxious lately.  She would like to increase her Celexa to see if she can get some more benefit she denies side effects of the medication  GERD-controlled with Prilosec  Tobacco Use - one pack lasts her three weeks. 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  She finds it difficult to eat healthy and exercise but knows that she needs to in order to lose weight.  Her weight continues to increase.  She is always feeling fatigued.  She is asking to try phentermine again Wt Readings from Last 3 Encounters:  05/02/20 289 lb 4 oz (131.2 kg)  04/24/19 281 lb 6.4 oz (127.6 kg)  05/01/18 264 lb 8 oz (120 kg)    All immunizations and health maintenance protocols were reviewed with the patient and needed orders were placed.  Appropriate screening laboratory values were ordered for the patient including screening of hyperlipidemia, renal function and hepatic function.  Medication reconciliation,  past medical history, social history, problem list and allergies were reviewed in detail with the patient  Goals were established with regard to weight loss, exercise, and  diet in  compliance with medications.  She turned 49 this year and is due for colonoscopy.  She is also due for mammogram   Review of Systems  Constitutional: Positive for fatigue.  HENT: Negative.   Eyes: Negative.   Respiratory: Negative.   Cardiovascular: Negative.   Gastrointestinal: Negative.   Endocrine: Negative.   Genitourinary: Negative.   Musculoskeletal: Negative.   Skin: Negative.   Allergic/Immunologic: Negative.   Neurological: Negative.   Hematological: Negative.   Psychiatric/Behavioral: Positive for dysphoric mood. The patient is nervous/anxious.    Past Medical History:  Diagnosis Date  . Ectopic pregnancy    x 3  . Essential hypertension   . GERD (gastroesophageal reflux disease)     Social History   Socioeconomic History  . Marital status: Significant Other    Spouse name: Not on file  . Number of children: Not on file  . Years of education: Not on file  . Highest education level: Not on file  Occupational History  . Not on file  Tobacco Use  . Smoking status: Current Some Day Smoker  . Smokeless tobacco: Never Used  Vaping Use  . Vaping Use: Never used  Substance and Sexual Activity  . Alcohol use: Yes    Alcohol/week: 6.0 standard drinks    Types: 6 Cans of beer per week  . Drug use: No  . Sexual activity: Not on file  Other Topics Concern  . Not on file  Social History Narrative  . Not on file   Social Determinants of Health   Financial Resource  Strain:   . Difficulty of Paying Living Expenses: Not on file  Food Insecurity:   . Worried About Programme researcher, broadcasting/film/video in the Last Year: Not on file  . Ran Out of Food in the Last Year: Not on file  Transportation Needs:   . Lack of Transportation (Medical): Not on file  . Lack of Transportation (Non-Medical): Not on file  Physical Activity:   . Days of Exercise per Week: Not on file  . Minutes of Exercise per Session: Not on file  Stress:   . Feeling of Stress : Not on file  Social  Connections:   . Frequency of Communication with Friends and Family: Not on file  . Frequency of Social Gatherings with Friends and Family: Not on file  . Attends Religious Services: Not on file  . Active Member of Clubs or Organizations: Not on file  . Attends Banker Meetings: Not on file  . Marital Status: Not on file  Intimate Partner Violence:   . Fear of Current or Ex-Partner: Not on file  . Emotionally Abused: Not on file  . Physically Abused: Not on file  . Sexually Abused: Not on file    Past Surgical History:  Procedure Laterality Date  . ECTOPIC PREGNANCY SURGERY    . [facility]  2008 & 2010    Family History  Problem Relation Age of Onset  . Hypertension Mother   . Hypertension Brother   . Osteoarthritis Father   . Breast cancer Other   . Cancer Maternal Grandmother        unknown  . Cancer Maternal Grandfather        unknown  . Cancer Paternal Grandmother        unknown  . Cancer Paternal Grandfather        unknowon    Allergies  Allergen Reactions  . Lisinopril Cough  . Morphine Sulfate     REACTION: vomiting, SOB  . Progesterone Nausea Only    Current Outpatient Medications on File Prior to Visit  Medication Sig Dispense Refill  . citalopram (CELEXA) 10 MG tablet TAKE 1 TABLET BY MOUTH EVERY DAY 90 tablet 1  . fluticasone (FLONASE) 50 MCG/ACT nasal spray Place 2 sprays into both nostrils daily. 16 g 6  . losartan (COZAAR) 50 MG tablet TAKE 1 TABLET BY MOUTH EVERY DAY 90 tablet 2  . omeprazole (PRILOSEC) 20 MG capsule TAKE 1 CAPSULE BY MOUTH EVERY DAY 90 capsule 3  . PROAIR HFA 108 (90 Base) MCG/ACT inhaler Inhale 2 puffs into the lungs every 6 (six) hours as needed. for wheezing  0   No current facility-administered medications on file prior to visit.    BP 130/78   Pulse 96   Ht 5' 6.5" (1.689 m)   Wt 289 lb 4 oz (131.2 kg)   SpO2 96%   BMI 45.99 kg/m       Objective:   Physical Exam Vitals and nursing note  reviewed.  Constitutional:      General: She is not in acute distress.    Appearance: Normal appearance. She is well-developed. She is obese. She is not ill-appearing.  HENT:     Head: Normocephalic and atraumatic.     Right Ear: Tympanic membrane, ear canal and external ear normal. There is no impacted cerumen.     Left Ear: Tympanic membrane, ear canal and external ear normal. There is no impacted cerumen.     Nose: Nose normal. No congestion  or rhinorrhea.     Mouth/Throat:     Mouth: Mucous membranes are moist.     Pharynx: Oropharynx is clear. No oropharyngeal exudate or posterior oropharyngeal erythema.  Eyes:     General:        Right eye: No discharge.        Left eye: No discharge.     Extraocular Movements: Extraocular movements intact.     Conjunctiva/sclera: Conjunctivae normal.     Pupils: Pupils are equal, round, and reactive to light.  Neck:     Thyroid: No thyromegaly.     Vascular: No carotid bruit.     Trachea: No tracheal deviation.  Cardiovascular:     Rate and Rhythm: Normal rate and regular rhythm.     Pulses: Normal pulses.     Heart sounds: Normal heart sounds. No murmur heard.  No friction rub. No gallop.   Pulmonary:     Effort: Pulmonary effort is normal. No respiratory distress.     Breath sounds: Normal breath sounds. No stridor. No wheezing, rhonchi or rales.  Chest:     Chest wall: No tenderness.  Abdominal:     General: Abdomen is flat. Bowel sounds are normal. There is no distension.     Palpations: Abdomen is soft. There is no mass.     Tenderness: There is no abdominal tenderness. There is no right CVA tenderness, left CVA tenderness, guarding or rebound.     Hernia: No hernia is present.  Musculoskeletal:        General: No swelling, tenderness, deformity or signs of injury. Normal range of motion.     Cervical back: Normal range of motion and neck supple.     Right lower leg: No edema.     Left lower leg: No edema.  Lymphadenopathy:      Cervical: No cervical adenopathy.  Skin:    General: Skin is warm and dry.     Coloration: Skin is not jaundiced or pale.     Findings: No bruising, erythema, lesion or rash.  Neurological:     General: No focal deficit present.     Mental Status: She is alert and oriented to person, place, and time.     Cranial Nerves: No cranial nerve deficit.     Sensory: No sensory deficit.     Motor: No weakness.     Coordination: Coordination normal.     Gait: Gait normal.     Deep Tendon Reflexes: Reflexes normal.  Psychiatric:        Mood and Affect: Mood normal.        Behavior: Behavior normal.        Thought Content: Thought content normal.        Judgment: Judgment normal.       Assessment & Plan:  1. Routine general medical examination at a health care facility -Needs to lose significant weight. - Follow up in one year for repeat CPE  - CBC with Differential/Platelet; Future - Hemoglobin A1c; Future - Lipid panel; Future - TSH; Future - CMP with eGFR([facility]); Future - Varicella Zoster Antibody, IgG - SAR CoV2 Serology (COVID 19)AB(IGG)IA - CMP with eGFR([facility]) - TSH - Lipid panel - Hemoglobin A1c - CBC with Differential/Platelet  2. Essential hypertension -Tinea with Cozaar 50 mg daily. - CBC with Differential/Platelet; Future - Hemoglobin A1c; Future - Lipid panel; Future - TSH; Future - CMP with eGFR([facility]); Future - losartan (COZAAR) 50 MG tablet; Take 1 tablet (50 mg total) by  mouth daily.  Dispense: 90 tablet; Refill: 3 - CMP with eGFR([facility]) - TSH - Lipid panel - Hemoglobin A1c - CBC with Differential/Platelet  3. TOBACCO ABUSE -Encouraged to quit completely  4. Class 3 severe obesity with serious comorbidity and body mass index (BMI) of 40.0 to 44.9 in adult, unspecified obesity type ([place]) -Start on low-dose phentermine.  She does understand that she needs to follow-up monthly and start working on lifestyle modifications to be successful in weight loss. -  phentermine 15 MG capsule; Take 1 capsule (15 mg total) by mouth every morning.  Dispense: 30 capsule; Refill: 0  5. Anxiety and depression - Will increase Celexa to 20 mg daily. Follow up if symptoms are not resolved  - citalopram (CELEXA) 20 MG tablet; Take 1 tablet (20 mg total) by mouth daily.  Dispense: 90 tablet; Refill: 1  6. Colon cancer screening  - Ambulatory referral to Gastroenterology  7. Gastroesophageal reflux disease  - omeprazole (PRILOSEC) 20 MG capsule; Take 1 capsule (20 mg total) by mouth daily.  Dispense: 90 capsule; Refill: 3  8. Need for influenza vaccination  - Flu Vaccine QUAD 36+ mos IM  Dorothyann Peng, NP

## 2020-05-02 NOTE — Patient Instructions (Signed)
It was great seeing you today   We will follow up with you regarding your blood work   I have sent in your prescriptions and added phentermine  Please follow up in one month   Someone will call you to schedule your colonoscopy

## 2020-05-03 LAB — COMPLETE METABOLIC PANEL WITH GFR
AG Ratio: 1.5 (calc) (ref 1.0–2.5)
ALT: 85 U/L — ABNORMAL HIGH (ref 6–29)
AST: 92 U/L — ABNORMAL HIGH (ref 10–35)
Albumin: 4.3 g/dL (ref 3.6–5.1)
Alkaline phosphatase (APISO): 85 U/L (ref 37–153)
BUN: 11 mg/dL (ref 7–25)
CO2: 27 mmol/L (ref 20–32)
Calcium: 9.5 mg/dL (ref 8.6–10.4)
Chloride: 101 mmol/L (ref 98–110)
Creat: 0.59 mg/dL (ref 0.50–1.05)
GFR, Est African American: 124 mL/min/{1.73_m2} (ref >=60)
GFR, Est Non African American: 107 mL/min/{1.73_m2} (ref >=60)
Globulin: 2.9 g/dL (calc) (ref 1.9–3.7)
Glucose, Bld: 89 mg/dL (ref 65–99)
Potassium: 4.4 mmol/L (ref 3.5–5.3)
Sodium: 137 mmol/L (ref 135–146)
Total Bilirubin: 0.7 mg/dL (ref 0.2–1.2)
Total Protein: 7.2 g/dL (ref 6.1–8.1)

## 2020-05-03 LAB — HEMOGLOBIN A1C
Hgb A1c MFr Bld: 5.7 % of total Hgb — ABNORMAL HIGH (ref <5.7)
Mean Plasma Glucose: 117 (calc)
eAG (mmol/L): 6.5 (calc)

## 2020-05-03 LAB — CBC WITH DIFFERENTIAL/PLATELET
Absolute Monocytes: 466 cells/uL (ref 200–950)
Basophils Absolute: 62 cells/uL (ref 0–200)
Basophils Relative: 0.7 %
Eosinophils Absolute: 132 cells/uL (ref 15–500)
Eosinophils Relative: 1.5 %
HCT: 40.2 % (ref 35.0–45.0)
Hemoglobin: 13.1 g/dL (ref 11.7–15.5)
Lymphs Abs: 3379 cells/uL (ref 850–3900)
MCH: 29.1 pg (ref 27.0–33.0)
MCHC: 32.6 g/dL (ref 32.0–36.0)
MCV: 89.3 fL (ref 80.0–100.0)
MPV: 10 fL (ref 7.5–12.5)
Monocytes Relative: 5.3 %
Neutro Abs: 4761 cells/uL (ref 1500–7800)
Neutrophils Relative %: 54.1 %
Platelets: 301 10*3/uL (ref 140–400)
RBC: 4.5 10*6/uL (ref 3.80–5.10)
RDW: 13 % (ref 11.0–15.0)
Total Lymphocyte: 38.4 %
WBC: 8.8 10*3/uL (ref 3.8–10.8)

## 2020-05-03 LAB — LIPID PANEL
Cholesterol: 224 mg/dL — ABNORMAL HIGH (ref <200)
HDL: 67 mg/dL (ref >=50)
LDL Cholesterol (Calc): 138 mg/dL (calc) — ABNORMAL HIGH
Non-HDL Cholesterol (Calc): 157 mg/dL (calc) — ABNORMAL HIGH (ref <130)
Total CHOL/HDL Ratio: 3.3 (calc) (ref <5.0)
Triglycerides: 91 mg/dL (ref <150)

## 2020-05-03 LAB — TSH: TSH: 1.46 mIU/L

## 2020-05-07 ENCOUNTER — Other Ambulatory Visit: Payer: Self-pay | Admitting: Adult Health

## 2020-05-23 ENCOUNTER — Other Ambulatory Visit: Payer: Self-pay | Admitting: Adult Health

## 2020-05-23 MED ORDER — ROSUVASTATIN CALCIUM 10 MG PO TABS: 10.0000 mg | ORAL_TABLET | Freq: Every day | ORAL | 3 refills | Status: DC

## 2020-05-29 ENCOUNTER — Other Ambulatory Visit: Payer: Self-pay

## 2020-05-29 ENCOUNTER — Encounter: Payer: Self-pay | Admitting: Adult Health

## 2020-05-29 ENCOUNTER — Ambulatory Visit (AMBULATORY_SURGERY_CENTER): Payer: Self-pay

## 2020-05-29 VITALS — Ht 67.5 in | Wt 281.6 lb

## 2020-05-29 DIAGNOSIS — Z1211 Encounter for screening for malignant neoplasm of colon: Secondary | ICD-10-CM

## 2020-05-29 NOTE — Progress Notes (Signed)
No allergies to soy or egg Pt is not on blood thinners   Does take phentermine as diet pills--instructed to stop taking 10 days prior to procedure.     Denies issues with sedation/intubation Denies atrial flutter/fib Denies constipation   Emmi instructions given to pt  Pt is aware of Covid safety and care partner requirements.

## 2020-05-30 ENCOUNTER — Encounter: Payer: Self-pay | Admitting: Gastroenterology

## 2020-05-30 MED ORDER — ROSUVASTATIN CALCIUM 10 MG PO TABS
10.0000 mg | ORAL_TABLET | Freq: Every day | ORAL | 3 refills | Status: DC
Start: 2020-05-30 — End: 2021-07-15

## 2020-06-03 ENCOUNTER — Other Ambulatory Visit: Payer: Self-pay

## 2020-06-03 ENCOUNTER — Encounter: Payer: Self-pay | Admitting: Adult Health

## 2020-06-03 ENCOUNTER — Ambulatory Visit: Payer: PRIVATE HEALTH INSURANCE | Admitting: Adult Health

## 2020-06-03 VITALS — BP 132/90 | HR 70 | Temp 98.0°F | Ht 67.5 in | Wt 275.0 lb

## 2020-06-03 DIAGNOSIS — Z6841 Body Mass Index (BMI) 40.0 and over, adult: Secondary | ICD-10-CM

## 2020-06-03 DIAGNOSIS — F5101 Primary insomnia: Secondary | ICD-10-CM | POA: Diagnosis not present

## 2020-06-03 MED ORDER — TEMAZEPAM 7.5 MG PO CAPS: 7.5000 mg | ORAL_CAPSULE | Freq: Every evening | ORAL | 0 refills | Status: DC | PRN

## 2020-06-03 MED ORDER — PHENTERMINE HCL 15 MG PO CAPS: 15.0000 mg | ORAL_CAPSULE | ORAL | 0 refills | Status: DC

## 2020-06-03 NOTE — Progress Notes (Signed)
Subjective:    Patient ID: Holly Horn, female    DOB: Aug 01, 1971, 49 y.o.   MRN: [id number]  HPI  49 year old female who  has a past medical history of Ectopic pregnancy, Essential hypertension, GERD (gastroesophageal reflux disease), and Hyperlipidemia.  She presents to the office today for one month follow up regarding obesity management. She was started on phentermine 15 mg daily. She Reports no side effects of this medication except for dry mouth. She has been walking more and eating healthier, eating a lot of salads and staying away from fast food  Her starting weight was 289 lbs  Weight today 275 lbs   She has been able to lose 14 pounds over the last month. She reports that she is feeling better overall   She continues to have issues with insomnia and is unable to stay asleep for more than 2-3 hours at a time. She does not feel as though this has gotten any worse since starting phentermine.   Wt Readings from Last 3 Encounters:  06/03/20 275 lb (124.7 kg)  05/29/20 281 lb 9.6 oz (127.7 kg)  05/02/20 289 lb 4 oz (131.2 kg)    Review of Systems See HPI   Past Medical History:  Diagnosis Date  . Ectopic pregnancy    x 3  . Essential hypertension   . GERD (gastroesophageal reflux disease)   . Hyperlipidemia     Social History   Socioeconomic History  . Marital status: Significant Other    Spouse name: Not on file  . Number of children: Not on file  . Years of education: Not on file  . Highest education level: Not on file  Occupational History  . Not on file  Tobacco Use  . Smoking status: Current Some Day Smoker  . Smokeless tobacco: Never Used  Vaping Use  . Vaping Use: Never used  Substance and Sexual Activity  . Alcohol use: Yes    Alcohol/week: 6.0 standard drinks    Types: 6 Cans of beer per week  . Drug use: No  . Sexual activity: Not on file  Other Topics Concern  . Not on file  Social History Narrative  . Not on file   Social Determinants  of Health   Financial Resource Strain:   . Difficulty of Paying Living Expenses: Not on file  Food Insecurity:   . Worried About Charity fundraiser in the Last Year: Not on file  . Ran Out of Food in the Last Year: Not on file  Transportation Needs:   . Lack of Transportation (Medical): Not on file  . Lack of Transportation (Non-Medical): Not on file  Physical Activity:   . Days of Exercise per Week: Not on file  . Minutes of Exercise per Session: Not on file  Stress:   . Feeling of Stress : Not on file  Social Connections:   . Frequency of Communication with Friends and Family: Not on file  . Frequency of Social Gatherings with Friends and Family: Not on file  . Attends Religious Services: Not on file  . Active Member of Clubs or Organizations: Not on file  . Attends Archivist Meetings: Not on file  . Marital Status: Not on file  Intimate Partner Violence:   . Fear of Current or Ex-Partner: Not on file  . Emotionally Abused: Not on file  . Physically Abused: Not on file  . Sexually Abused: Not on file    Past Surgical  History:  Procedure Laterality Date  . ECTOPIC PREGNANCY SURGERY    . [place] [facility]  2008 & 2010    Family History  Problem Relation Age of Onset  . Hypertension Mother   . Hypertension Brother   . Osteoarthritis Father   . Breast cancer Other   . Cancer Maternal Grandmother        unknown  . Cancer Maternal Grandfather        unknown  . Cancer Paternal Grandmother        unknown  . Cancer Paternal Grandfather        unknowon  . Colon cancer Neg Hx   . Colon polyps Neg Hx   . Esophageal cancer Neg Hx   . Rectal cancer Neg Hx   . Stomach cancer Neg Hx     Allergies  Allergen Reactions  . Lisinopril Cough  . Morphine Sulfate     REACTION: vomiting, SOB  . Progesterone Nausea Only    Current Outpatient Medications on File Prior to Visit  Medication Sig Dispense Refill  . citalopram (CELEXA) 20 MG tablet Take 1 tablet  (20 mg total) by mouth daily. 90 tablet 1  . fluticasone (FLONASE) 50 MCG/ACT nasal spray Place 2 sprays into both nostrils daily. 16 g 6  . losartan (COZAAR) 50 MG tablet Take 1 tablet (50 mg total) by mouth daily. 90 tablet 3  . omeprazole (PRILOSEC) 20 MG capsule Take 1 capsule (20 mg total) by mouth daily. 90 capsule 3  . phentermine 15 MG capsule Take 1 capsule (15 mg total) by mouth every morning. 30 capsule 0  . PROAIR HFA 108 (90 Base) MCG/ACT inhaler Inhale 2 puffs into the lungs every 6 (six) hours as needed. for wheezing  0  . rosuvastatin (CRESTOR) 10 MG tablet Take 1 tablet (10 mg total) by mouth daily. 90 tablet 3   No current facility-administered medications on file prior to visit.    BP 132/90 (BP Location: Left Arm, Patient Position: Sitting, Cuff Size: Large)   Pulse 70   Temp 98 F (36.7 C) (Oral)   Ht 5' 7.5" (1.715 m)   Wt 275 lb (124.7 kg)   LMP 08/23/2013   SpO2 96%   BMI 42.44 kg/m       Objective:   Physical Exam Vitals and nursing note reviewed.  Constitutional:      Appearance: Normal appearance. She is obese.  Cardiovascular:     Rate and Rhythm: Normal rate and regular rhythm.     Pulses: Normal pulses.     Heart sounds: Normal heart sounds.  Pulmonary:     Effort: Pulmonary effort is normal.     Breath sounds: Normal breath sounds.  Skin:    General: Skin is warm and dry.  Neurological:     General: No focal deficit present.     Mental Status: She is alert and oriented to person, place, and time.       Assessment & Plan:  1. Class 3 severe obesity with serious comorbidity and body mass index (BMI) of 40.0 to 44.9 in adult, unspecified obesity type (HCC) - Continue on Phentermine 15 mg. Continue with lifestyle modifications  - Follow up in one month  - phentermine 15 MG capsule; Take 1 capsule (15 mg total) by mouth every morning.  Dispense: 30 capsule; Refill: 0  2. Primary insomnia - Will trial her on Restoril 7. 5mg  - temazepam  (RESTORIL) 7.5 MG capsule; Take 1 capsule (7.5 mg  total) by mouth at bedtime as needed for sleep.  Dispense: 30 capsule; Refill: 0  Dorothyann Peng, NP

## 2020-06-09 HISTORY — PX: COLONOSCOPY: SHX174

## 2020-06-12 ENCOUNTER — Ambulatory Visit (AMBULATORY_SURGERY_CENTER): Payer: PRIVATE HEALTH INSURANCE | Admitting: Gastroenterology

## 2020-06-12 ENCOUNTER — Encounter: Payer: Self-pay | Admitting: Gastroenterology

## 2020-06-12 ENCOUNTER — Other Ambulatory Visit: Payer: Self-pay

## 2020-06-12 VITALS — BP 147/98 | HR 62 | Temp 97.6°F | Resp 17 | Ht 67.0 in | Wt 281.0 lb

## 2020-06-12 DIAGNOSIS — D124 Benign neoplasm of descending colon: Secondary | ICD-10-CM | POA: Diagnosis not present

## 2020-06-12 DIAGNOSIS — D3A024 Benign carcinoid tumor of the descending colon: Secondary | ICD-10-CM | POA: Diagnosis not present

## 2020-06-12 DIAGNOSIS — D122 Benign neoplasm of ascending colon: Secondary | ICD-10-CM

## 2020-06-12 DIAGNOSIS — Z1211 Encounter for screening for malignant neoplasm of colon: Secondary | ICD-10-CM

## 2020-06-12 DIAGNOSIS — D3A026 Benign carcinoid tumor of the rectum: Secondary | ICD-10-CM

## 2020-06-12 DIAGNOSIS — D128 Benign neoplasm of rectum: Secondary | ICD-10-CM

## 2020-06-12 DIAGNOSIS — D125 Benign neoplasm of sigmoid colon: Secondary | ICD-10-CM | POA: Diagnosis not present

## 2020-06-12 DIAGNOSIS — D3A022 Benign carcinoid tumor of the ascending colon: Secondary | ICD-10-CM

## 2020-06-12 MED ORDER — SODIUM CHLORIDE 0.9 % IV SOLN: 500.0000 mL | INTRAVENOUS | Status: DC

## 2020-06-12 NOTE — Progress Notes (Signed)
A and O x3. Report to RN. Tolerated MAC anesthesia well.

## 2020-06-12 NOTE — Discharge Instructions (Signed)
Resume previous medications. Add Fibercon 1-2 tablets by mouth daily. (over the counter) High fiber diet. Handouts on findings given to patient. Await pathology for final recommendations. May expect some spotting over the next 24 hours due to polyp removal.  YOU HAD AN ENDOSCOPIC PROCEDURE TODAY AT [facility]:   Refer to the procedure report that was given to you for any specific questions about what was found during the examination.  If the procedure report does not answer your questions, please call your gastroenterologist to clarify.  If you requested that your care partner not be given the details of your procedure findings, then the procedure report has been included in a sealed envelope for you to review at your convenience later.  YOU SHOULD EXPECT: Some feelings of bloating in the abdomen. Passage of more gas than usual.  Walking can help get rid of the air that was put into your GI tract during the procedure and reduce the bloating. If you had a lower endoscopy (such as a colonoscopy or flexible sigmoidoscopy) you may notice spotting of blood in your stool or on the toilet paper. If you underwent a bowel prep for your procedure, you may not have a normal bowel movement for a few days.  Please Note:  You might notice some irritation and congestion in your nose or some drainage.  This is from the oxygen used during your procedure.  There is no need for concern and it should clear up in a day or so.  SYMPTOMS TO REPORT IMMEDIATELY:   Following lower endoscopy (colonoscopy or flexible sigmoidoscopy):  Excessive amounts of blood in the stool  Significant tenderness or worsening of abdominal pains  Swelling of the abdomen that is new, acute  Fever of 100F or higher  For urgent or emergent issues, a gastroenterologist can be reached at any hour by calling [phone]. Do not use MyChart messaging for urgent concerns.    DIET:  We do recommend a small meal at first,  but then you may proceed to your regular diet.  Drink plenty of fluids but you should avoid alcoholic beverages for 24 hours.  ACTIVITY:  You should plan to take it easy for the rest of today and you should NOT DRIVE or use heavy machinery until tomorrow (because of the sedation medicines used during the test).    FOLLOW UP: Our staff will call the number listed on your records 48-72 hours following your procedure to check on you and address any questions or concerns that you may have regarding the information given to you following your procedure. If we do not reach you, we will leave a message.  We will attempt to reach you two times.  During this call, we will ask if you have developed any symptoms of COVID 19. If you develop any symptoms (ie: fever, flu-like symptoms, shortness of breath, cough etc.) before then, please call [phone].  If you test positive for Covid 19 in the 2 weeks post procedure, please call and report this information to [place].    If any biopsies were taken you will be contacted by phone or by letter within the next 1-3 weeks.  Please call us at [phone] if you have not heard about the biopsies in 3 weeks.    SIGNATURES/CONFIDENTIALITY: You and/or your care partner have signed paperwork which will be entered into your electronic medical record.  These signatures attest to the fact that that the information above on your After Visit Summary  has been reviewed and is understood.  Full responsibility of the confidentiality of this discharge information lies with you and/or your care-partner. 

## 2020-06-12 NOTE — Progress Notes (Signed)
Called to room to assist during endoscopic procedure.  Patient ID and intended procedure confirmed with present staff. Received instructions for my participation in the procedure from the performing physician.  

## 2020-06-12 NOTE — Op Note (Signed)
[facility] Patient Name: Holly Horn Procedure Date: 06/12/2020 9:24 AM MRN: [id number] Endoscopist: Justice Britain , MD Age: 49 Referring MD:  Date of Birth: Dec 12, 1970 Gender: Female Account #: [id number] Procedure:                Colonoscopy Indications:              Screening for colorectal malignant neoplasm, This                            is the patient's first colonoscopy Medicines:                Monitored Anesthesia Care Procedure:                Pre-Anesthesia Assessment:                           - Prior to the procedure, a History and Physical                            was performed, and patient medications and                            allergies were reviewed. The patient's tolerance of                            previous anesthesia was also reviewed. The risks                            and benefits of the procedure and the sedation                            options and risks were discussed with the patient.                            All questions were answered, and informed consent                            was obtained. Prior Anticoagulants: The patient has                            taken no previous anticoagulant or antiplatelet                            agents. ASA Grade Assessment: II - A patient with                            mild systemic disease. After reviewing the risks                            and benefits, the patient was deemed in                            satisfactory condition to undergo the procedure.  After obtaining informed consent, the colonoscope                            was passed under direct vision. Throughout the                            procedure, the patient's blood pressure, pulse, and                            oxygen saturations were monitored continuously. The                            Colonoscope was introduced through the anus and                            advanced to the 5 cm  into the ileum. The                            colonoscopy was performed without difficulty. The                            patient tolerated the procedure. The quality of the                            bowel preparation was adequate. The terminal ileum,                            ileocecal valve, appendiceal orifice, and rectum                            were photographed. Scope In: 9:35:26 AM Scope Out: 9:51:45 AM Scope Withdrawal Time: 0 hours 11 minutes 57 seconds  Total Procedure Duration: 0 hours 16 minutes 19 seconds  Findings:                 Skin tags were found on perianal exam.                           The digital rectal exam findings include                            hemorrhoids. Pertinent negatives include no                            palpable rectal lesions.                           The terminal ileum and ileocecal valve appeared                            normal.                           Six sessile polyps were found in the rectum (1),  sigmoid colon (1), descending colon (1) and                            ascending colon (3). The polyps were 2 to 6 mm in                            size. These polyps were removed with a cold snare.                            Resection and retrieval were complete.                           Multiple small-mouthed diverticula were found in                            the recto-sigmoid colon and sigmoid colon.                           Normal mucosa was found in the entire colon                            otherwise.                           Non-bleeding non-thrombosed external and internal                            hemorrhoids were found during retroflexion, during                            perianal exam and during digital exam. The                            hemorrhoids were Grade II (internal hemorrhoids                            that prolapse but reduce spontaneously). Complications:            No  immediate complications. Estimated Blood Loss:     Estimated blood loss was minimal. Impression:               - Perianal skin tags found on perianal exam.                           - Hemorrhoids found on digital rectal exam.                           - The examined portion of the ileum was normal.                           - Six 2 to 6 mm polyps in the rectum, in the                            sigmoid colon, in the descending colon and in the  ascending colon, removed with a cold snare.                            Resected and retrieved.                           - Diverticulosis in the recto-sigmoid colon and in                            the sigmoid colon.                           - Normal mucosa in the entire examined colon                            otherwise.                           - Non-bleeding non-thrombosed external and internal                            hemorrhoids. Recommendation:           - The patient will be observed post-procedure,                            until all discharge criteria are met.                           - Discharge patient to home.                           - Patient has a contact number available for                            emergencies. The signs and symptoms of potential                            delayed complications were discussed with the                            patient. Return to normal activities tomorrow.                            Written discharge instructions were provided to the                            patient.                           - High fiber diet.                           - Use FiberCon 1-2 tablets PO daily.                           - Continue present medications.                           -  Await pathology results.                           - Small amount of spotting may be noticeable for                            next 24 hours to be aware of but continue to                             monitor for other signs/symptoms of complications                            as well.                           - Repeat colonoscopy in likely 3 years for                            surveillance based on pathology results.                           - The findings and recommendations were discussed                            with the patient. Justice Britain, MD 06/12/2020 9:58:30 AM

## 2020-06-12 NOTE — Progress Notes (Signed)
/  s CW I have reviewed the patient's medical history in detail and updated the computerized patient record.v

## 2020-06-16 ENCOUNTER — Telehealth: Payer: Self-pay

## 2020-06-16 NOTE — Telephone Encounter (Signed)
  Follow up Call-  Call back number 06/12/2020  Post procedure Call Back phone  # [phone]  Permission to leave phone message Yes  Some recent data might be hidden     Patient questions:  Do you have a fever, pain , or abdominal swelling? No. Pain Score  0 *  Have you tolerated food without any problems? Yes.    Have you been able to return to your normal activities? Yes.    Do you have any questions about your discharge instructions: Diet   No. Medications  No. Follow up visit  No.  Do you have questions or concerns about your Care? No.  Actions: * If pain score is 4 or above: No action needed, pain <4. 1. Have you developed a fever since your procedure? no  2.   Have you had an respiratory symptoms (SOB or cough) since your procedure? no  3.   Have you tested positive for COVID 19 since your procedure no  4.   Have you had any family members/close contacts diagnosed with the COVID 19 since your procedure?  no   If yes to any of these questions please route to Joylene John, RN and Joella Prince, RN

## 2020-06-23 ENCOUNTER — Encounter: Payer: Self-pay | Admitting: Gastroenterology

## 2020-06-24 ENCOUNTER — Other Ambulatory Visit: Payer: Self-pay

## 2020-06-24 DIAGNOSIS — K629 Disease of anus and rectum, unspecified: Secondary | ICD-10-CM

## 2020-06-26 ENCOUNTER — Other Ambulatory Visit: Payer: PRIVATE HEALTH INSURANCE

## 2020-06-26 DIAGNOSIS — K629 Disease of anus and rectum, unspecified: Secondary | ICD-10-CM

## 2020-06-30 ENCOUNTER — Other Ambulatory Visit: Payer: Self-pay

## 2020-06-30 DIAGNOSIS — K629 Disease of anus and rectum, unspecified: Secondary | ICD-10-CM

## 2020-06-30 DIAGNOSIS — D3A8 Other benign neuroendocrine tumors: Secondary | ICD-10-CM

## 2020-07-01 LAB — CHROMOGRANIN A: Chromogranin A (ng/mL): 343.3 ng/mL — ABNORMAL HIGH (ref 0.0–101.8)

## 2020-07-04 ENCOUNTER — Ambulatory Visit (HOSPITAL_COMMUNITY)
Admission: RE | Admit: 2020-07-04 | Discharge: 2020-07-04 | Disposition: A | Discharge status: Home / Self Care | Payer: PRIVATE HEALTH INSURANCE | Source: Ambulatory Visit | Attending: Gastroenterology | Admitting: Gastroenterology

## 2020-07-04 ENCOUNTER — Other Ambulatory Visit: Payer: Self-pay

## 2020-07-04 DIAGNOSIS — K629 Disease of anus and rectum, unspecified: Secondary | ICD-10-CM | POA: Diagnosis not present

## 2020-07-04 LAB — POCT I-STAT CREATININE: Creatinine, Ser: 0.6 mg/dL (ref 0.44–1.00)

## 2020-07-04 IMAGING — CT CT ABD-PELV W/ CM
2 of 5 series · 16 of 46 positions shown, 18 images · IV contrast (APPLIED)
Comparison: None.

CLINICAL DATA: Colorectal carcinoma.  Initial staging.

EXAM:
CT ABDOMEN AND PELVIS WITH CONTRAST
TECHNIQUE: Multidetector CT imaging of the abdomen and pelvis was performed
using the standard protocol following bolus administration of
intravenous contrast.
CONTRAST:  100mL OMNIPAQUE IOHEXOL 300 MG/ML  SOLN

[Series 2: axial st · axial · 0.98mm/px · z∈[-709,-254]mm · 13 of 105 slices shown, 15 images]
[im 7/105  soft-tissue]
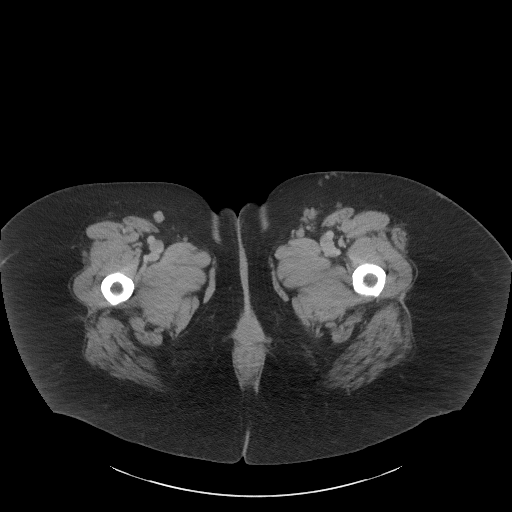
[im 7/105  bone]
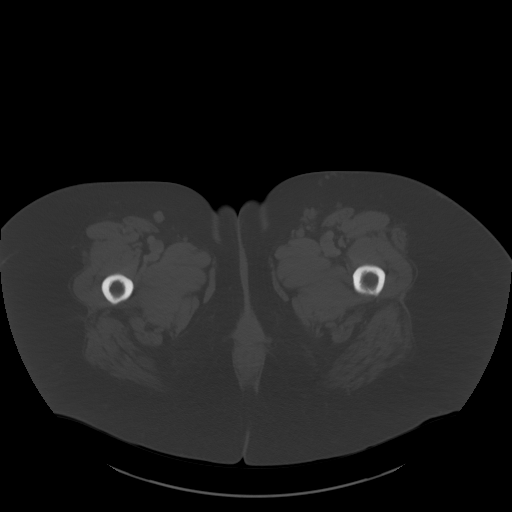
[im 14/105  soft-tissue]
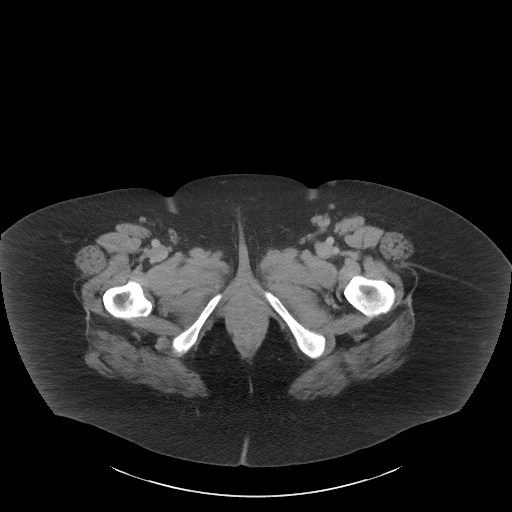
[im 21/105  soft-tissue]
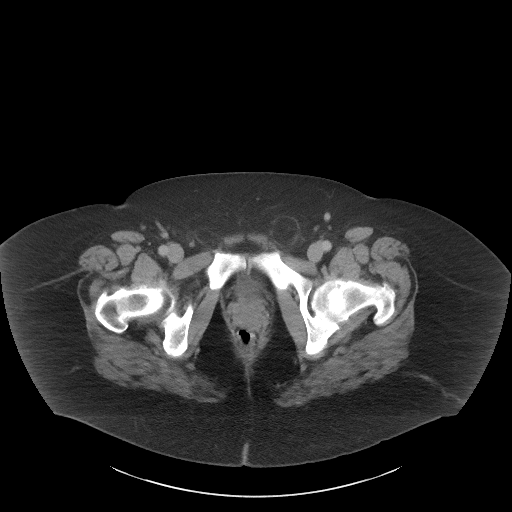
[im 28/105  soft-tissue]
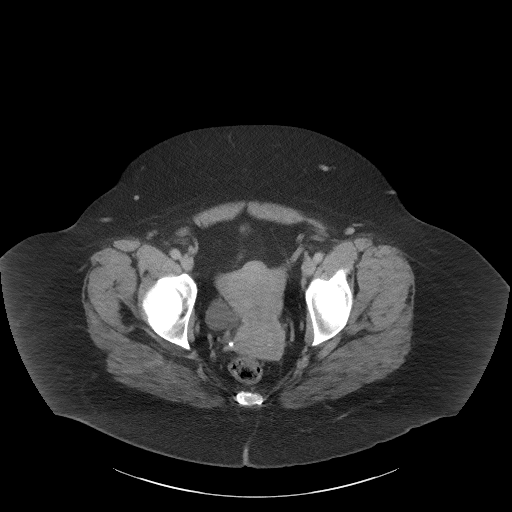
[im 35/105  soft-tissue]
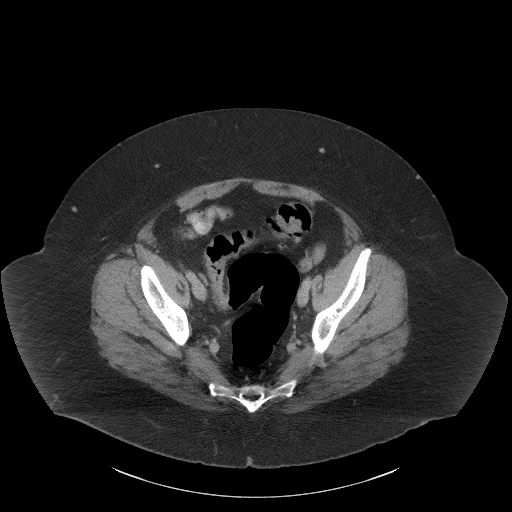
[im 42/105  soft-tissue]
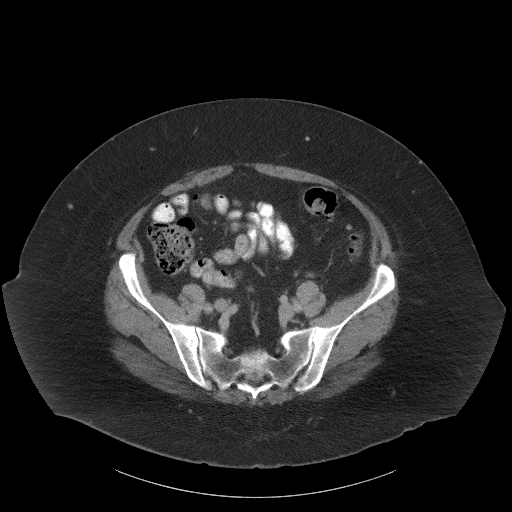
[im 56/105  soft-tissue]
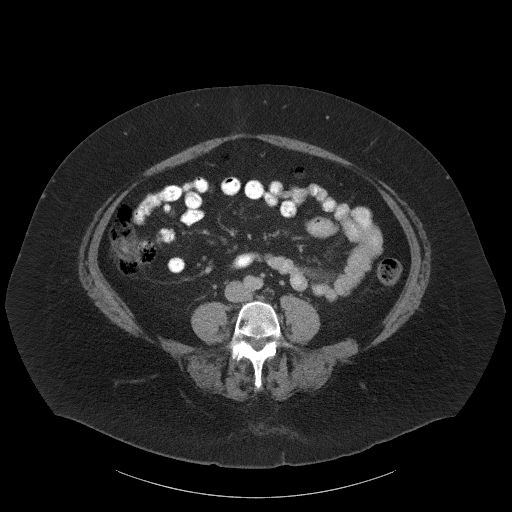
[im 63/105  soft-tissue]
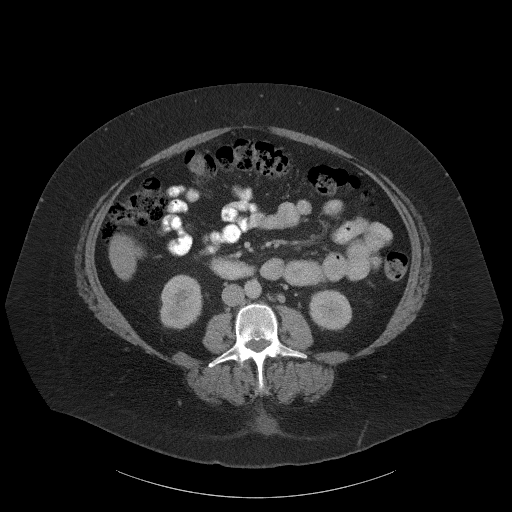
[im 70/105  soft-tissue]
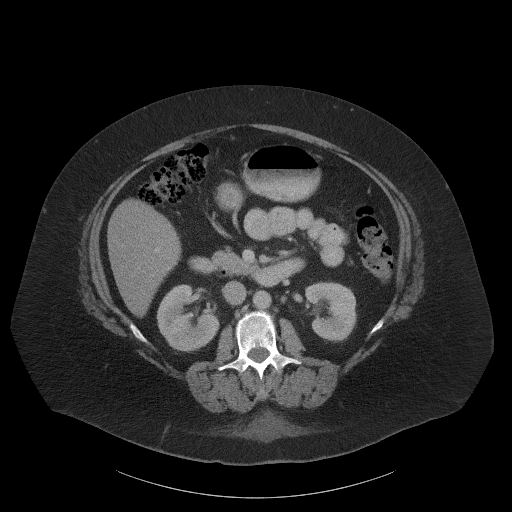
[im 70/105  bone]
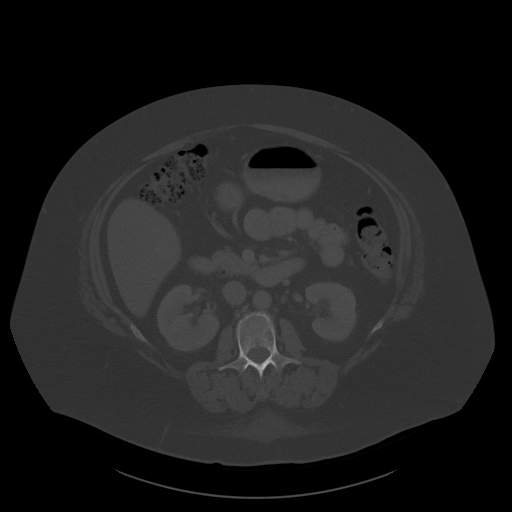
[im 77/105  soft-tissue]
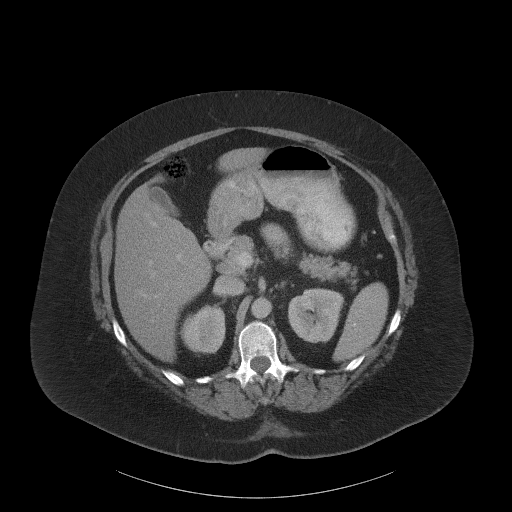
[im 84/105  soft-tissue]
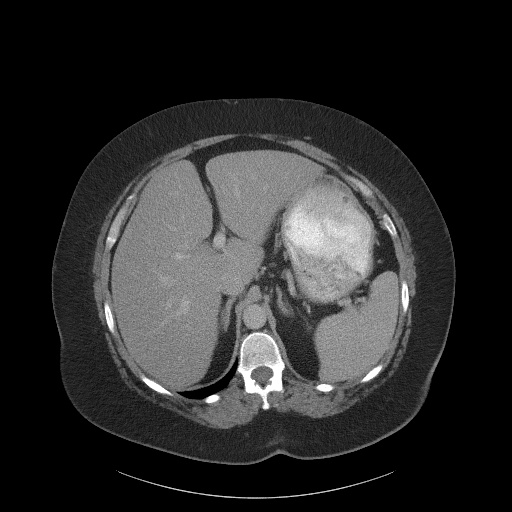
[im 91/105  soft-tissue]
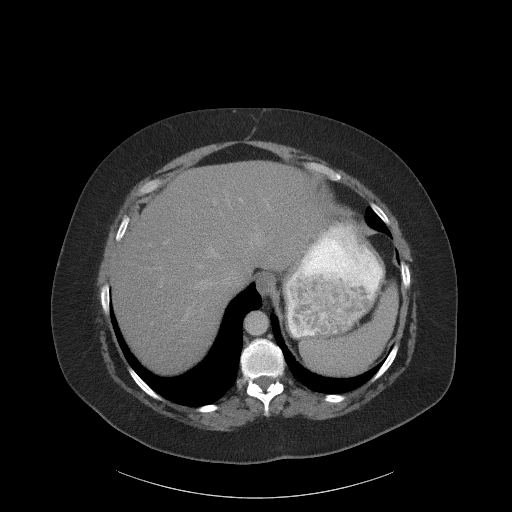
[im 98/105  soft-tissue]
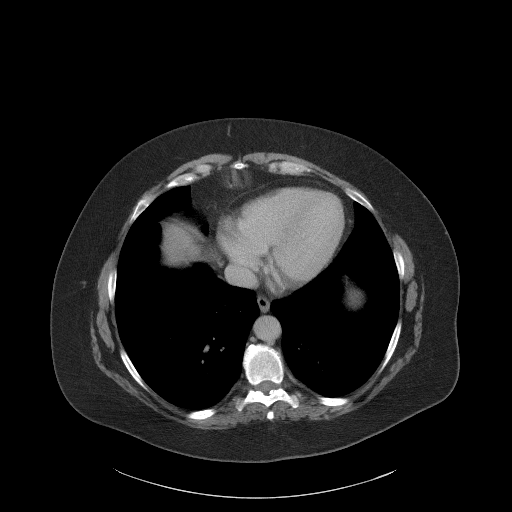

[Series 5: coronal st · coronal · 1.00mm/px · 3 of 139 slices shown]
[im 47/139  soft-tissue]
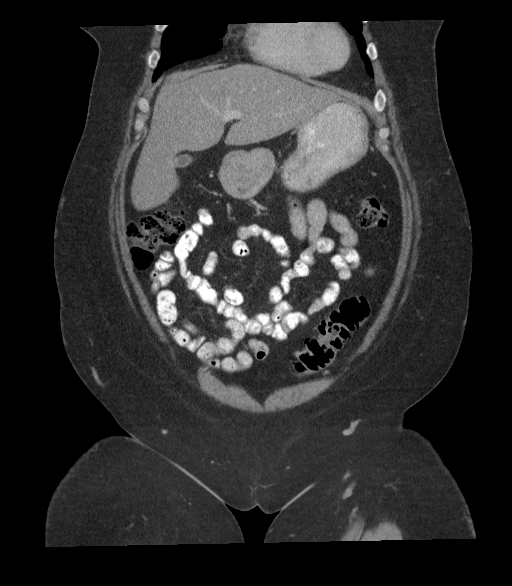
[im 62/139  soft-tissue]
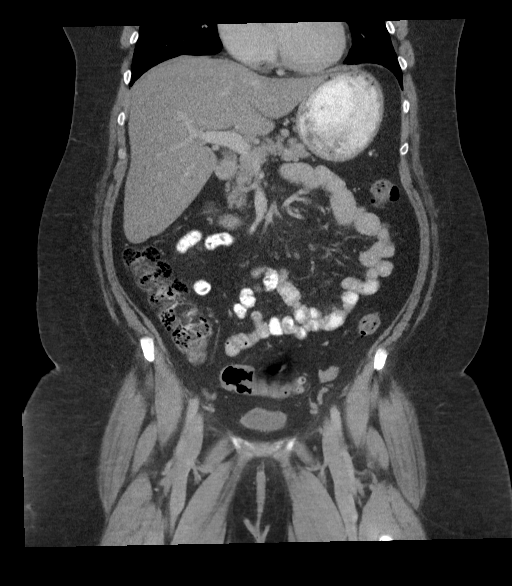
[im 77/139  soft-tissue]
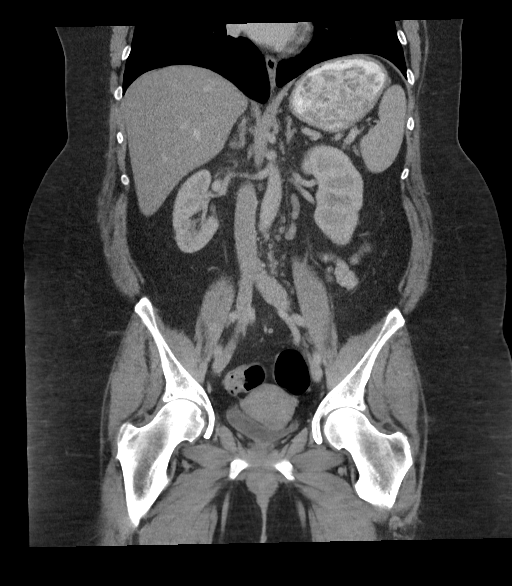

[16 of 46 positions shown; findings below may reference images not displayed]

FINDINGS: Lower chest: No acute abnormality.

Hepatobiliary: No focal liver abnormality is seen. No gallstones,
gallbladder wall thickening, or biliary dilatation.

Pancreas: Unremarkable. No pancreatic ductal dilatation or
surrounding inflammatory changes.

Spleen: Normal in size without focal abnormality.

Adrenals/Urinary Tract: Normal appearance of the adrenal glands. No
kidney mass or hydronephrosis identified. Bladder unremarkable

Stomach/Bowel: Stomach appears normal. The appendix is visualized
and appears normal. No bowel wall thickening, inflammation, or
distension. Rectal lesion is difficult to visualize due to unprepped
colon and lack of enteric contrast material within the large bowel.

Vascular/Lymphatic: No aneurysm. No abdominopelvic adenopathy
identified.

Reproductive: Anteflexed uterus with subserosal fundal fibroid
measuring 2 cm, image 79/2. No adnexal mass.

Other: No free fluid or fluid collections within the abdomen or
pelvis. Fat containing left inguinal hernia noted.

Musculoskeletal: No acute or significant osseous findings.
Degenerative disc disease identified at L5-S1.
IMPRESSION: 1. Rectal lesion is difficult to visualize due to unprepped colon
and lack of enteric contrast material within the distal [8T].
2. No findings of metastatic disease within the abdomen or pelvis.
3. Fat containing left inguinal hernia.

## 2020-07-04 MED ORDER — IOHEXOL 300 MG/ML  SOLN
100.0000 mL | Freq: Once | INTRAMUSCULAR | Status: AC | PRN
  Administered 2020-07-04: 100 mL via INTRAVENOUS

## 2020-07-09 ENCOUNTER — Telehealth: Payer: Self-pay | Admitting: Gastroenterology

## 2020-07-09 NOTE — Telephone Encounter (Signed)
The pt wanted to see of we have had any cancellations.  I did confirm that she has the soonest appt and we will call if possible. The pt has been advised of the information and verbalized understanding.   She also states she is on phentermine and will stop 10 days prior to the procedure

## 2020-07-09 NOTE — Telephone Encounter (Signed)
Pt is requesting a call back from a nurse to discuss her procedure coming up in January.

## 2020-07-21 ENCOUNTER — Ambulatory Visit: Payer: PRIVATE HEALTH INSURANCE | Admitting: Adult Health

## 2020-07-21 ENCOUNTER — Telehealth: Payer: Self-pay | Admitting: Gastroenterology

## 2020-07-21 ENCOUNTER — Encounter: Payer: Self-pay | Admitting: Adult Health

## 2020-07-21 ENCOUNTER — Other Ambulatory Visit: Payer: Self-pay

## 2020-07-21 VITALS — Wt 276.0 lb

## 2020-07-21 DIAGNOSIS — Z6841 Body Mass Index (BMI) 40.0 and over, adult: Secondary | ICD-10-CM

## 2020-07-21 DIAGNOSIS — F5101 Primary insomnia: Secondary | ICD-10-CM | POA: Diagnosis not present

## 2020-07-21 MED ORDER — PHENTERMINE HCL 15 MG PO CAPS: 15.0000 mg | ORAL_CAPSULE | ORAL | 0 refills | Status: DC

## 2020-07-21 MED ORDER — TEMAZEPAM 15 MG PO CAPS: 15.0000 mg | ORAL_CAPSULE | Freq: Every evening | ORAL | 1 refills | Status: DC | PRN

## 2020-07-21 NOTE — Telephone Encounter (Signed)
Abigail Butts from [facility] is wanting to know if the pt's procedure scheduled for 08/16/2020 could be moved up to sometime this year.

## 2020-07-21 NOTE — Progress Notes (Signed)
Subjective:    Patient ID: Holly Horn, female    DOB: [birth year], 49 y.o.   MRN: [id number]  HPI 49 year old female who  has a past medical history of Ectopic pregnancy, Essential hypertension, GERD (gastroesophageal reflux disease), and Hyperlipidemia.  She presents to the office today for follow up regarding obesity management and insomnia.   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  She has an upcoming procedure and January.  She reports that since she found out that she has colon cancer she has been drinking a lot more, not eating healthy nor exercising.  Reports that she quit drinking about 10 days ago that she note was not helping her cause.  Obesity Management -she was started on phentermine 15 mg in September 2021.  At this time she was walking more and eating healthier and trying to stay away from fast foods as much as possible.  After her first months she was able to lose approximately 14 pounds. Wt Readings from Last 3 Encounters:  07/21/20 276 lb (125.2 kg)  06/12/20 281 lb (127.5 kg)  06/03/20 275 lb (124.7 kg)   Insomnia -longstanding issue.  We reevaluated her in 06/03/2020 she was unable to stay asleep for longer than 2 to 3 hours at a time.  She did not feel as though her symptoms have gotten worse since starting phentermine.  She was placed on restoral 7.5 mg.  She reports that restoral has helped but some nights she continues to have trouble falling asleep due to anxiety about her upcoming procedure and colon cancer.   Review of Systems See HPI   Past Medical History:  Diagnosis Date  . Ectopic pregnancy    x 3  . Essential hypertension   . GERD (gastroesophageal reflux disease)   . Hyperlipidemia     Social History   Socioeconomic History  . Marital status: Significant Other    Spouse name: Not on file  . Number of children: Not on file  . Years of education: Not on file  . Highest education  level: Not on file  Occupational History  . Not on file  Tobacco Use  . Smoking status: Current Some Day Smoker  . Smokeless tobacco: Never Used  Vaping Use  . Vaping Use: Never used  Substance and Sexual Activity  . Alcohol use: Yes    Alcohol/week: 6.0 standard drinks    Types: 6 Cans of beer per week  . Drug use: No  . Sexual activity: Not on file  Other Topics Concern  . Not on file  Social History Narrative  . Not on file   Social Determinants of Health   Financial Resource Strain: Not on file  Food Insecurity: Not on file  Transportation Needs: Not on file  Physical Activity: Not on file  Stress: Not on file  Social Connections: Not on file  Intimate Partner Violence: Not on file    Past Surgical History:  Procedure Laterality Date  . ECTOPIC PREGNANCY SURGERY    . [facility]  2008 & 2010    Family History  Problem Relation Age of Onset  . Hypertension Mother   . Hypertension Brother   . Osteoarthritis Father   . Breast cancer Other   . Cancer Maternal Grandmother        unknown  . Cancer Maternal Grandfather        unknown  . Cancer Paternal Grandmother  unknown  . Cancer Paternal Grandfather        unknowon  . Colon cancer Neg Hx   . Colon polyps Neg Hx   . Esophageal cancer Neg Hx   . Rectal cancer Neg Hx   . Stomach cancer Neg Hx     Allergies  Allergen Reactions  . Lisinopril Cough  . Morphine Sulfate     REACTION: vomiting, SOB  . Progesterone Nausea Only    Current Outpatient Medications on File Prior to Visit  Medication Sig Dispense Refill  . citalopram (CELEXA) 20 MG tablet Take 1 tablet (20 mg total) by mouth daily. 90 tablet 1  . fluticasone (FLONASE) 50 MCG/ACT nasal spray Place 2 sprays into both nostrils daily. (Patient not taking: Reported on 06/12/2020) 16 g 6  . losartan (COZAAR) 50 MG tablet Take 1 tablet (50 mg total) by mouth daily. 90 tablet 3  . omeprazole (PRILOSEC) 20 MG capsule Take 1 capsule (20 mg  total) by mouth daily. 90 capsule 3  . phentermine 15 MG capsule Take 1 capsule (15 mg total) by mouth every morning. (Patient not taking: Reported on 06/12/2020) 30 capsule 0  . PROAIR HFA 108 (90 Base) MCG/ACT inhaler Inhale 2 puffs into the lungs every 6 (six) hours as needed. for wheezing (Patient not taking: Reported on 06/12/2020)  0  . rosuvastatin (CRESTOR) 10 MG tablet Take 1 tablet (10 mg total) by mouth daily. 90 tablet 3  . temazepam (RESTORIL) 7.5 MG capsule Take 1 capsule (7.5 mg total) by mouth at bedtime as needed for sleep. 30 capsule 0   No current facility-administered medications on file prior to visit.    Wt 276 lb (125.2 kg)   LMP 08/23/2013   BMI 43.23 kg/m       Objective:   Physical Exam Vitals and nursing note reviewed.  Constitutional:      Appearance: Normal appearance. She is obese.  Cardiovascular:     Rate and Rhythm: Normal rate and regular rhythm.     Pulses: Normal pulses.     Heart sounds: Normal heart sounds.  Pulmonary:     Effort: Pulmonary effort is normal.     Breath sounds: Normal breath sounds.  Skin:    General: Skin is warm and dry.     Capillary Refill: Capillary refill takes less than 2 seconds.  Neurological:     General: No focal deficit present.     Mental Status: She is alert and oriented to person, place, and time.  Psychiatric:        Mood and Affect: Mood normal.        Behavior: Behavior normal.        Thought Content: Thought content normal.        Judgment: Judgment normal.       Assessment & Plan:  1. Primary insomnia -We will increase Restoril to 15 mg to see if she can get some better sleep.  We will follow-up with this in 1 month - temazepam (RESTORIL) 15 MG capsule; Take 1 capsule (15 mg total) by mouth at bedtime as needed for sleep.  Dispense: 30 capsule; Refill: 1  2. Class 3 severe obesity with serious comorbidity and body mass index (BMI) of 40.0 to 44.9 in adult, unspecified obesity type ([place]) -Happy that  she quit drinking.  She was advised to start exercising and eating healthy.  We will keep her on phentermine 15 mg.  Follow-up in 1 month - phentermine 15 MG capsule;  Take 1 capsule (15 mg total) by mouth every morning.  Dispense: 30 capsule; Refill: 0   Dorothyann Peng, NP

## 2020-07-21 NOTE — Telephone Encounter (Signed)
I have advised the caller that no sooner appts are available.  She thanked me for calling and will notify the pt.

## 2020-08-11 ENCOUNTER — Other Ambulatory Visit (HOSPITAL_COMMUNITY)
Admission: RE | Admit: 2020-08-11 | Discharge: 2020-08-11 | Disposition: A | Discharge status: Home / Self Care | Payer: PRIVATE HEALTH INSURANCE | Source: Ambulatory Visit | Attending: Gastroenterology | Admitting: Gastroenterology

## 2020-08-11 DIAGNOSIS — Z01812 Encounter for preprocedural laboratory examination: Secondary | ICD-10-CM | POA: Insufficient documentation

## 2020-08-11 DIAGNOSIS — Z20822 Contact with and (suspected) exposure to covid-19: Secondary | ICD-10-CM | POA: Diagnosis not present

## 2020-08-12 LAB — SARS CORONAVIRUS 2 (TAT 6-24 HRS): SARS Coronavirus 2: NEGATIVE

## 2020-08-13 ENCOUNTER — Encounter (HOSPITAL_COMMUNITY): Payer: Self-pay | Admitting: Gastroenterology

## 2020-08-13 ENCOUNTER — Other Ambulatory Visit: Payer: Self-pay

## 2020-08-13 NOTE — Progress Notes (Signed)
Patient pre call complete, patient states she has been quarantined since covid test, she has clear instructions on colon prep, she confirmed she has a ride home, and states she will refrain from smoking day of procedure. All questions addressed.

## 2020-08-13 NOTE — Progress Notes (Signed)
Patient denies shortness of breath, fever, cough or chest pain.  PCP - Shirline Frees, NP Cardiologist - n/a Gastro - Dr Meridee Score OB-GYN - Dr Jackelyn Knife  Chest x-ray - n/a EKG - DOS - 08/14/20 Stress Test - n/a ECHO - n/a Cardiac Cath - n/a  STOP now taking any Aspirin (unless otherwise instructed by your surgeon), Aleve, Naproxen, Ibuprofen, Motrin, Advil, Goody's, BC's, all herbal medications, fish oil, and all vitamins.   Coronavirus Screening Covid test on 08/11/20 was negative.  Patient verbalized understanding of instructions that were given via phone.

## 2020-08-14 ENCOUNTER — Ambulatory Visit (HOSPITAL_COMMUNITY): Payer: PRIVATE HEALTH INSURANCE | Admitting: Anesthesiology

## 2020-08-14 ENCOUNTER — Encounter (HOSPITAL_COMMUNITY)
Admission: RE | Disposition: A | Discharge status: Home / Self Care | Payer: Self-pay | Source: Home / Self Care | Attending: Gastroenterology

## 2020-08-14 ENCOUNTER — Encounter (HOSPITAL_COMMUNITY): Payer: Self-pay | Admitting: Gastroenterology

## 2020-08-14 ENCOUNTER — Ambulatory Visit (HOSPITAL_COMMUNITY)
Admission: RE | Admit: 2020-08-14 | Discharge: 2020-08-14 | Disposition: A | Discharge status: Home / Self Care | Payer: PRIVATE HEALTH INSURANCE | Attending: Gastroenterology | Admitting: Gastroenterology

## 2020-08-14 DIAGNOSIS — K641 Second degree hemorrhoids: Secondary | ICD-10-CM | POA: Diagnosis not present

## 2020-08-14 DIAGNOSIS — Z9889 Other specified postprocedural states: Secondary | ICD-10-CM | POA: Diagnosis not present

## 2020-08-14 DIAGNOSIS — Z888 Allergy status to other drugs, medicaments and biological substances status: Secondary | ICD-10-CM | POA: Diagnosis not present

## 2020-08-14 DIAGNOSIS — K6289 Other specified diseases of anus and rectum: Secondary | ICD-10-CM

## 2020-08-14 DIAGNOSIS — D3A8 Other benign neuroendocrine tumors: Secondary | ICD-10-CM

## 2020-08-14 DIAGNOSIS — Z9109 Other allergy status, other than to drugs and biological substances: Secondary | ICD-10-CM | POA: Diagnosis not present

## 2020-08-14 DIAGNOSIS — K5730 Diverticulosis of large intestine without perforation or abscess without bleeding: Secondary | ICD-10-CM

## 2020-08-14 DIAGNOSIS — K629 Disease of anus and rectum, unspecified: Secondary | ICD-10-CM

## 2020-08-14 DIAGNOSIS — Z885 Allergy status to narcotic agent status: Secondary | ICD-10-CM | POA: Diagnosis not present

## 2020-08-14 HISTORY — PX: EUS: SHX5427

## 2020-08-14 HISTORY — PX: SUBMUCOSAL LIFTING INJECTION: SHX6855

## 2020-08-14 HISTORY — PX: ENDOSCOPIC MUCOSAL RESECTION: SHX6839

## 2020-08-14 HISTORY — PX: HEMOSTASIS CLIP PLACEMENT: SHX6857

## 2020-08-14 HISTORY — PX: FLEXIBLE SIGMOIDOSCOPY: SHX5431

## 2020-08-14 SURGERY — ULTRASOUND, LOWER GI TRACT, ENDOSCOPIC
Anesthesia: Monitor Anesthesia Care

## 2020-08-14 MED ORDER — PROPOFOL 500 MG/50ML IV EMUL
INTRAVENOUS | Status: DC | PRN
  Administered 2020-08-14: 150 ug/kg/min via INTRAVENOUS

## 2020-08-14 MED ORDER — FENTANYL CITRATE (PF) 100 MCG/2ML IJ SOLN
INTRAMUSCULAR | Status: DC | PRN
  Administered 2020-08-14: 100 ug via INTRAVENOUS

## 2020-08-14 MED ORDER — SODIUM CHLORIDE 0.9 % IV SOLN: INTRAVENOUS | Status: DC

## 2020-08-14 MED ORDER — DEXAMETHASONE SODIUM PHOSPHATE 4 MG/ML IJ SOLN
INTRAMUSCULAR | Status: DC | PRN
  Administered 2020-08-14: 4 mg via INTRAVENOUS

## 2020-08-14 MED ORDER — MIDAZOLAM HCL 5 MG/5ML IJ SOLN
INTRAMUSCULAR | Status: DC | PRN
  Administered 2020-08-14: 2 mg via INTRAVENOUS

## 2020-08-14 MED ORDER — DEXMEDETOMIDINE (PRECEDEX) IN NS 20 MCG/5ML (4 MCG/ML) IV SYRINGE
PREFILLED_SYRINGE | INTRAVENOUS | Status: DC | PRN
  Administered 2020-08-14 (×4): 4 ug via INTRAVENOUS

## 2020-08-14 MED ORDER — ONDANSETRON HCL 4 MG/2ML IJ SOLN
INTRAMUSCULAR | Status: DC | PRN
  Administered 2020-08-14: 4 mg via INTRAVENOUS

## 2020-08-14 NOTE — Op Note (Signed)
[facility] Patient Name: Holly Horn Procedure Date : 08/14/2020 MRN: [id number] Attending MD: Justice Britain , MD Date of Birth: 04/06/1971 CSN: [id number] Age: 50 Admit Type: Inpatient Procedure:                Lower EUS Indications:              Rectal deformity found on endoscopy; subepithelial                            tumor versus extrinsic compression, Neuroendocrine                            Tumor Providers:                Justice Britain, MD, Burtis Junes, RN, Ladona Ridgel, Technician, Signa Kell , CRNA Referring MD:              Medicines:                Monitored Anesthesia Care Complications:            No immediate complications. Estimated Blood Loss:     Estimated blood loss: none. Procedure:                Pre-Anesthesia Assessment:                           - Prior to the procedure, a History and Physical                            was performed, and patient medications and                            allergies were reviewed. The patient's tolerance of                            previous anesthesia was also reviewed. The risks                            and benefits of the procedure and the sedation                            options and risks were discussed with the patient.                            All questions were answered, and informed consent                            was obtained. Prior Anticoagulants: The patient has                            taken no previous anticoagulant or antiplatelet                            agents. ASA Grade  Assessment: II - A patient with                            mild systemic disease. After reviewing the risks                            and benefits, the patient was deemed in                            satisfactory condition to undergo the procedure.                           After obtaining informed consent, the endoscope was                            passed under direct  vision. Throughout the                            procedure, the patient's blood pressure, pulse, and                            oxygen saturations were monitored continuously. The                            [id number] ([id number]) Olympus therapeutic                            gastroscope was introduced through the anus and                            advanced to the the left transverse colon. After                            obtaining informed consent, the endoscope was                            passed under direct vision. Throughout the                            procedure, the patient's blood pressure, pulse, and                            oxygen saturations were monitored continuously. The                            [id number] ([id number]) Olympus Radial EUS scope was                            introduced through the anus and advanced to the the                            sigmoid colon for ultrasound. The lower EUS was  accomplished without difficulty. The patient                            tolerated the procedure. The quality of the bowel                            preparation was adequate. Scope In: 8:14:52 AM Scope Out: 8:52:09 AM Total Procedure Duration: 0 hours 37 minutes 17 seconds  Findings:      The digital rectal exam findings include hemorrhoids. Pertinent       negatives include no palpable rectal lesions.      ENDOSCOPIC FINDING: :      A small amount of semi-liquid semi-solid stool was found in the entire       colon, interfering with visualization. Lavage of the area was performed       using copious amounts, resulting in clearance with adequate       visualization.      A few small-mouthed diverticula were found in the recto-sigmoid colon       and sigmoid colon.      A small post polypectomy scar was found in the mid rectum. There was no       evidence of the previous polyp/NET. After completion of the EUS,       preparations were made for  mucosal resection to ensure complete removal.       NBI imaging and White-light endoscopy was done to demarcate the borders       of the lesion. Orise gel was injected to raise the lesion. Band ligator       and snare mucosal resection was performed. Resection and retrieval were       complete. To prevent bleeding after mucosal resection, three hemostatic       clips were successfully placed (MR conditional). There was no bleeding       during, or at the end, of the procedure.      Non-bleeding non-thrombosed external and internal hemorrhoids were       found. The hemorrhoids were Grade II (internal hemorrhoids that prolapse       but reduce spontaneously).      ENDOSONOGRAPHIC FINDING: :      Wall thickening was found in the rectum. This was encountered from       approximately 6 to 8 cm (from the anal verge). The site of thickening       was non-circumferential and located predominantly at the left-anterior       rectal wall. This finding appeared to be primarily due to increased       thickness of the luminal interface/superficial mucosa (Layer 1) and deep       mucosa (Layer 2). Consistent with known area of scar from prior       polypectomy.      The rectum was otherwise normal.      No malignant-appearing lymph nodes were visualized in the perirectal       region and in the left iliac region.      The perirectal space was normal.      The internal anal sphincter was visualized endosonographically and       appeared normal. Impression:               Flexible Sigmoidoscopy Impression:                           -  Hemorrhoids found on digital rectal exam.                           - Stool in the entire examined colon. Lavaged with                            adequate visualization.                           - Diverticulosis in the recto-sigmoid colon and in                            the sigmoid colon.                           - Post-polypectomy scar in the mid rectum. After                             EUS, mucosal resection performed of scar (via                            Cap/Band/Snare technique) to ensure complete                            removal of NET. Clips (MR conditional) were placed                            to decrease risk of bleeding.                           - Non-bleeding non-thrombosed external and internal                            hemorrhoids.                           EUS Impression:                           - Wall thickening was visualized                            endosonographically in the rectum. This was due to                            increased thickness of the luminal                            interface/superficial mucosa (Layer 1) and deep                            mucosa (Layer 2). This is in area of scar site.                           - Endosonographic images of the rectum were  unremarkable.                           - No malignant-appearing lymph nodes were                            visualized endosonographically in the perirectal                            region and in the left iliac region.                           - Endosonographic images of the perirectal space                            were unremarkable.                           - The internal anal sphincter was visualized                            endosonographically and appeared normal. Recommendation:           - The patient will be observed post-procedure,                            until all discharge criteria are met.                           - Discharge patient to home.                           - Patient has a contact number available for                            emergencies. The signs and symptoms of potential                            delayed complications were discussed with the                            patient. Return to normal activities tomorrow.                            Written discharge instructions were provided to  the                            patient.                           - High fiber diet.                           - Use FiberCon 1-2 tablets PO daily.                           - No ibuprofen, naproxen, or other non-steroidal  anti-inflammatory drugs for 2 weeks.                           - Await path results.                           - Repeat lower endoscopic ultrasound in 1 year for                            surveillance is tentatively plan based on final                            pathology. Earlier if any evidence of recurrent                            NET/persistent NET with positive margins noted.                           - Will plan to recheck Chromogranin A level in 1-2                            months. I will have her off of her Omeprazole for                            at least 1 week in effort of seeing true level.                           - Follow up in clinic in 4-weeks to be scheduled.                           - The findings and recommendations were discussed                            with the patient.                           - The findings and recommendations were discussed                            with the designated responsible adult. Procedure Code(s):        --- Professional ---                           [phone], Sigmoidoscopy, flexible; with endoscopic                            mucosal resection                           28786, Sigmoidoscopy, flexible; with endoscopic                            ultrasound examination Diagnosis Code(s):        --- Professional ---  K64.1, Second degree hemorrhoids                           Z98.890, Other specified postprocedural states                           K62.89, Other specified diseases of anus and rectum                           I89.9, Noninfective disorder of lymphatic vessels                            and lymph nodes, unspecified                           K57.30,  Diverticulosis of large intestine without                            perforation or abscess without bleeding CPT copyright 2019 [employer]. All rights reserved. The codes documented in this report are preliminary and upon coder review may  be revised to meet current compliance requirements. Justice Britain, MD 08/14/2020 9:17:03 AM Number of Addenda: 0

## 2020-08-14 NOTE — Transfer of Care (Signed)
Immediate Anesthesia Transfer of Care Note  Patient: Johana Kandis Cocking  Procedure(s) Performed: LOWER ENDOSCOPIC ULTRASOUND (EUS) (N/A ) FLEXIBLE SIGMOIDOSCOPY (N/A ) HEMOSTASIS CLIP PLACEMENT ENDOSCOPIC MUCOSAL RESECTION SUBMUCOSAL LIFTING INJECTION  Patient Location: PACU  Anesthesia Type:MAC  Level of Consciousness: awake  Airway & Oxygen Therapy: Patient Spontanous Breathing and Patient connected to face mask oxygen  Post-op Assessment: Report given to RN and Post -op Vital signs reviewed and stable  Post vital signs: Reviewed and stable  Last Vitals:  Vitals Value Taken Time  BP 120/78 08/14/20 0902  Temp    Pulse 78 08/14/20 0902  Resp 18 08/14/20 0902  SpO2 96 % 08/14/20 0902  Vitals shown include unvalidated device data.  Last Pain:  Vitals:   08/14/20 0700  TempSrc: Tympanic  PainSc: 0-No pain         Complications: No complications documented.

## 2020-08-14 NOTE — H&P (Signed)
GASTROENTEROLOGY PROCEDURE H&P NOTE   Primary Care Physician: Shirline Frees, NP  HPI: Holly Horn is a 50 y.o. female who presents for Colonoscopy/Flex Sigmoidoscopy with EUS and possible EMR.  Past Medical History:  Diagnosis Date  . Ectopic pregnancy    x 3  . Essential hypertension   . GERD (gastroesophageal reflux disease)   . Hyperlipidemia    Past Surgical History:  Procedure Laterality Date  . COLONOSCOPY  06/2020  . ECTOPIC PREGNANCY SURGERY    . [facility]  2008 & 2010   Current Facility-Administered Medications  Medication Dose Route Frequency Provider Last Rate Last Admin  . 0.9 %  sodium chloride infusion   Intravenous Continuous Mansouraty, Netty Starring., MD       Allergies  Allergen Reactions  . Lisinopril Cough  . Morphine Sulfate     REACTION: vomiting, SOB  . Progesterone Nausea Only   Family History  Problem Relation Age of Onset  . Hypertension Mother   . Hypertension Brother   . Osteoarthritis Father   . Breast cancer Other   . Cancer Maternal Grandmother        unknown  . Cancer Maternal Grandfather        unknown  . Cancer Paternal Grandmother        unknown  . Cancer Paternal Grandfather        unknowon  . Colon cancer Neg Hx   . Colon polyps Neg Hx   . Esophageal cancer Neg Hx   . Rectal cancer Neg Hx   . Stomach cancer Neg Hx    Social History   Socioeconomic History  . Marital status: Significant Other    Spouse name: Not on file  . Number of children: Not on file  . Years of education: Not on file  . Highest education level: Not on file  Occupational History  . Not on file  Tobacco Use  . Smoking status: Current Some Day Smoker    Types: Cigarettes  . Smokeless tobacco: Never Used  . Tobacco comment: 1-2 pk/week  Vaping Use  . Vaping Use: Never used  Substance and Sexual Activity  . Alcohol use: Yes    Alcohol/week: 5.0 - 10.0 standard drinks    Types: 5 - 10 Standard drinks or equivalent per week   . Drug use: No  . Sexual activity: Not on file  Other Topics Concern  . Not on file  Social History Narrative  . Not on file   Social Determinants of Health   Financial Resource Strain: Not on file  Food Insecurity: Not on file  Transportation Needs: Not on file  Physical Activity: Not on file  Stress: Not on file  Social Connections: Not on file  Intimate Partner Violence: Not on file    Physical Exam: Vital signs in last 24 hours: Temp:  [97.7 F (36.5 C)] 97.7 F (36.5 C) (01/06 0700) Pulse Rate:  [75] 75 (01/06 0700) Resp:  [14] 14 (01/06 0700) BP: (140)/(82) 140/82 (01/06 0700) SpO2:  [98 %] 98 % (01/06 0700) Weight:  [122.5 kg-125.2 kg] 122.5 kg (01/06 0700)   GEN: NAD EYE: Sclerae anicteric ENT: MMM CV: Non-tachycardic GI: Soft, NT/ND NEURO:  Alert & Oriented x 3  Lab Results: No results for input(s): WBC, HGB, HCT, PLT in the last 72 hours. BMET No results for input(s): NA, K, CL, CO2, GLUCOSE, BUN, CREATININE, CALCIUM in the last 72 hours. LFT No results for input(s): PROT, ALBUMIN, AST, ALT,  ALKPHOS, BILITOT, BILIDIR, IBILI in the last 72 hours. PT/INR No results for input(s): LABPROT, INR in the last 72 hours.   Impression / Plan: This is a 50 y.o.female who presents for Colonoscopy/Flex Sigmoidoscopy with EUS and possible EMR.  The risks of an EUS including intestinal perforation, bleeding, infection, aspiration, and medication effects were discussed as was the possibility it may not give a definitive diagnosis if a biopsy is performed.    Based upon the description and endoscopic pictures I do feel that it is reasonable to pursue an Advanced Polypectomy attempt of the polyp/lesion.  We discussed some of the techniques of advanced polypectomy which include Endoscopic Mucosal Resection, OVESCO Full-Thickness Resection, Endorotor Morcellation, and Tissue Ablation via Fulguration.  We also reviewed images of typical techniques as noted above.  The risks  and benefits of endoscopic evaluation were discussed with the patient; these include but are not limited to the risk of perforation, infection, bleeding, missed lesions, lack of diagnosis, severe illness requiring hospitalization, as well as anesthesia and sedation related illnesses.  During attempts at advanced resection, the risks of bleeding and perforation/leak are increased as opposed to diagnostic and screening procedures, and that was discussed with the patient as well.   In addition, I explained that with the possible need for piecemeal resection, subsequent short-interval endoscopic evaluation for follow up and potential retreatment of the lesion/area may be necessary.  I did offer, a referral to surgery in order for patient to have opportunity to discuss surgical management/intervention prior to finalizing decision for attempt at endoscopic removal, however, the patient deferred on this.  If, after attempt at removal of the polyp/lesion, it is found that the patient has a complication or that an invasive lesion or malignant lesion is found, or that the polyp/lesion continues to recur, the patient is aware and understands that surgery may still be indicated/required.   The risks and benefits of endoscopic evaluation were discussed with the patient; these include but are not limited to the risk of perforation, infection, bleeding, missed lesions, lack of diagnosis, severe illness requiring hospitalization, as well as anesthesia and sedation related illnesses.  The patient is agreeable to proceed.    Justice Britain, MD [facility] Advanced Endoscopy Office # [id number]

## 2020-08-14 NOTE — Anesthesia Postprocedure Evaluation (Signed)
Anesthesia Post Note  Patient: Pretty Kandis Cocking  Procedure(s) Performed: LOWER ENDOSCOPIC ULTRASOUND (EUS) (N/A ) FLEXIBLE SIGMOIDOSCOPY (N/A ) HEMOSTASIS CLIP PLACEMENT ENDOSCOPIC MUCOSAL RESECTION SUBMUCOSAL LIFTING INJECTION     Patient location during evaluation: PACU Anesthesia Type: MAC Level of consciousness: awake and alert Pain management: pain level controlled Vital Signs Assessment: post-procedure vital signs reviewed and stable Respiratory status: spontaneous breathing, nonlabored ventilation and respiratory function stable Cardiovascular status: blood pressure returned to baseline and stable Postop Assessment: no apparent nausea or vomiting Anesthetic complications: no   No complications documented.  Last Vitals:  Vitals:   08/14/20 0900 08/14/20 0910  BP: 120/78 (!) 125/91  Pulse: 77 66  Resp: 18 15  Temp: (!) 36.4 C   SpO2: 97% 96%    Last Pain:  Vitals:   08/14/20 0910  TempSrc:   PainSc: 0-No pain                 Pervis Hocking

## 2020-08-14 NOTE — Anesthesia Procedure Notes (Signed)
Procedure Name: MAC Date/Time: 08/14/2020 8:00 AM Performed by: Lieutenant Diego, CRNA Pre-anesthesia Checklist: Patient identified, Emergency Drugs available, Suction available, Patient being monitored and Timeout performed Patient Re-evaluated:Patient Re-evaluated prior to induction Oxygen Delivery Method: Simple face mask Preoxygenation: Pre-oxygenation with 100% oxygen Induction Type: IV induction

## 2020-08-14 NOTE — Anesthesia Preprocedure Evaluation (Signed)
Anesthesia Evaluation    Reviewed: Allergy & Precautions, Patient's Chart, lab work & pertinent test results  Airway Mallampati: II  TM Distance: >3 FB Neck ROM: Full    Dental no notable dental hx.    Pulmonary asthma , Current Smoker,    Pulmonary exam normal breath sounds clear to auscultation       Cardiovascular hypertension, Pt. on medications Normal cardiovascular exam Rhythm:Regular Rate:Normal     Neuro/Psych  Headaches, PSYCHIATRIC DISORDERS Anxiety Depression    GI/Hepatic GERD  Medicated and Controlled,(+)     substance abuse  alcohol use, Rectal lesions, neuroendocrine tumor    Endo/Other  Morbid obesityBMI 43  Renal/GU negative Renal ROS  negative genitourinary   Musculoskeletal negative musculoskeletal ROS (+)   Abdominal   Peds  Hematology negative hematology ROS (+)   Anesthesia Other Findings   Reproductive/Obstetrics negative OB ROS                             Anesthesia Physical Anesthesia Plan  ASA: III  Anesthesia Plan: MAC   Post-op Pain Management:    Induction:   PONV Risk Score and Plan: 3 and Ondansetron, Dexamethasone, Midazolam and Treatment may vary due to age or medical condition  Airway Management Planned: Natural Airway and Nasal Cannula  Additional Equipment: None  Intra-op Plan:   Post-operative Plan:   Informed Consent: I have reviewed the patients History and Physical, chart, labs and discussed the procedure including the risks, benefits and alternatives for the proposed anesthesia with the patient or authorized representative who has indicated his/her understanding and acceptance.       Plan Discussed with: CRNA  Anesthesia Plan Comments:         Anesthesia Quick Evaluation

## 2020-08-14 NOTE — Discharge Instructions (Signed)
Colonoscopy, Adult, Care After This sheet gives you information about how to care for yourself after your procedure. Your doctor may also give you more specific instructions. If you have problems or questions, call your doctor. What can I expect after the procedure? After the procedure, it is common to have:  A small amount of blood in your poop (stool) for 24 hours.  Some gas.  Mild cramping or bloating in your belly (abdomen). Follow these instructions at home: Eating and drinking   Drink enough fluid to keep your pee (urine) pale yellow.  Follow instructions from your doctor about what you cannot eat or drink.  Return to your normal diet as told by your doctor. Avoid heavy or fried foods that are hard to digest. Activity  Rest as told by your doctor.  Do not sit for a long time without moving. Get up to take short walks every 1-2 hours. This is important. Ask for help if you feel weak or unsteady.  Return to your normal activities as told by your doctor. Ask your doctor what activities are safe for you. To help cramping and bloating:   Try walking around.  Put heat on your belly as told by your doctor. Use the heat source that your doctor recommends, such as a moist heat pack or a heating pad. ? Put a towel between your skin and the heat source. ? Leave the heat on for 20-30 minutes. ? Remove the heat if your skin turns bright red. This is very important if you are unable to feel pain, heat, or cold. You may have a greater risk of getting burned. General instructions  For the first 24 hours after the procedure: ? Do not drive or use machinery. ? Do not sign important documents. ? Do not drink alcohol. ? Do your daily activities more slowly than normal. ? Eat foods that are soft and easy to digest.  Take over-the-counter or prescription medicines only as told by your doctor.  Keep all follow-up visits as told by your doctor. This is important. Contact a doctor  if:  You have blood in your poop 2-3 days after the procedure. Get help right away if:  You have more than a small amount of blood in your poop.  You see large clumps of tissue (blood clots) in your poop.  Your belly is swollen.  You feel like you may vomit (nauseous).  You vomit.  You have a fever.  You have belly pain that gets worse, and medicine does not help your pain. Summary  After the procedure, it is common to have a small amount of blood in your poop. You may also have mild cramping and bloating in your belly.  For the first 24 hours after the procedure, do not drive or use machinery, do not sign important documents, and do not drink alcohol.  Get help right away if you have a lot of blood in your poop, feel like you may vomit, have a fever, or have more belly pain. This information is not intended to replace advice given to you by your health care provider. Make sure you discuss any questions you have with your health care provider. Document Revised: 02/19/2019 Document Reviewed: 02/19/2019 Elsevier Patient Education  2020 [employer] Inc.  

## 2020-08-15 LAB — SURGICAL PATHOLOGY

## 2020-08-17 ENCOUNTER — Encounter: Payer: Self-pay | Admitting: Gastroenterology

## 2020-08-21 ENCOUNTER — Ambulatory Visit: Payer: PRIVATE HEALTH INSURANCE | Admitting: Adult Health

## 2020-08-27 ENCOUNTER — Telehealth: Payer: Self-pay | Admitting: Gastroenterology

## 2020-08-27 NOTE — Telephone Encounter (Signed)
Patient calling for path results 

## 2020-08-27 NOTE — Telephone Encounter (Signed)
I returned the pt call and had to leave a message.  She was advised that the report (letter) is in [place] for her to view as well as it has been mailed.  I have also resent to her My Chart today.    Holly Horn [address] [place] [address]  Dear [name],  I am writing to inform you that the pathology  taken during your recent sigmoidoscopy/endoscopic ultrasound examination showed:   FINAL MICROSCOPIC DIAGNOSIS:  A. RECTUM, SCAR LESION, BIOPSY:  - Fibrosis consistent with scar. No residual neuroendocrine tumor  identified.   What does this all mean? The resection site from your recent exam showed only evidence of scar tissue.  No evidence of recurrent or persistent neuroendocrine tumor/carcinoid.  This is good news. We plan to repeat your lower endoscopic ultrasound in 1 year. I would like to see you in clinic on 10/03/20 at 8:50 am. Within the next couple of months we will repeat one of your blood test to see what your chromogranin looks like but we will need you off your omeprazole.   Please call us at Dept: [phone]  if you have persistent problems or have questions about your condition that have not been fully answered at this time.   Sincerely,  Irving Copas., MD      [place], [phone]                     1      Print Letter   Notes  None

## 2020-09-18 ENCOUNTER — Ambulatory Visit: Payer: PRIVATE HEALTH INSURANCE | Admitting: Adult Health

## 2020-09-18 ENCOUNTER — Encounter: Payer: Self-pay | Admitting: Adult Health

## 2020-10-03 ENCOUNTER — Ambulatory Visit (INDEPENDENT_AMBULATORY_CARE_PROVIDER_SITE_OTHER): Payer: PRIVATE HEALTH INSURANCE | Admitting: Gastroenterology

## 2020-10-03 ENCOUNTER — Other Ambulatory Visit: Payer: Self-pay

## 2020-10-03 ENCOUNTER — Encounter: Payer: Self-pay | Admitting: Gastroenterology

## 2020-10-03 VITALS — BP 114/78 | HR 94 | Ht 67.0 in | Wt 281.2 lb

## 2020-10-03 DIAGNOSIS — R899 Unspecified abnormal finding in specimens from other organs, systems and tissues: Secondary | ICD-10-CM

## 2020-10-03 DIAGNOSIS — K219 Gastro-esophageal reflux disease without esophagitis: Secondary | ICD-10-CM | POA: Diagnosis not present

## 2020-10-03 DIAGNOSIS — K629 Disease of anus and rectum, unspecified: Secondary | ICD-10-CM

## 2020-10-03 DIAGNOSIS — D490 Neoplasm of unspecified behavior of digestive system: Secondary | ICD-10-CM

## 2020-10-03 DIAGNOSIS — Z86010 Personal history of colonic polyps: Secondary | ICD-10-CM | POA: Diagnosis not present

## 2020-10-03 DIAGNOSIS — D3A8 Other benign neuroendocrine tumors: Secondary | ICD-10-CM | POA: Diagnosis not present

## 2020-10-03 NOTE — Progress Notes (Addendum)
[facility] VISIT   Primary Care Provider Dorothyann Peng, NP [place] [address] [phone]  Patient Profile: [name] is a 50 y.o. 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  The patient presents to the [facility] for an evaluation and management of problem(s) noted below:  Problem List 1. Benign neuroendocrine tumor of rectum   2. Elevated chromogranin A   3. Hx of adenomatous colonic polyps   4. Gastroesophageal reflux disease, unspecified whether esophagitis present     History of Present Illness This is a patient I met in November 2021 for a direct screening colonoscopy.  6 polyps were removed with 1 polyp having a slightly differentiated in the rectum.  5 tubular adenomas were noted and 1 neuroendocrine tumor which was attributed to the rectal polyp.  The resection site had evidence of neuroendocrine tumor going to the base of the lesion.  We obtained a chromogranin A level after her initial colonoscopy which was elevated at 343.3 ng/mL.  She had been on PPI therapy at the time due to a history of GERD.  This was then followed up with a CT abdomen/pelvis with results as below but no evidence of any pelvic lymphadenopathy or abdominal lymphadenopathy.  So, in January 2022 a lower EUS and EMR was performed of the rectal scar, no evidence of persistent neuroendocrine tumor was found at that time.  The plan at the time was for the patient to follow-up and then discuss long-term follow-up.  Today, the patient is doing well.  She does have some concerns about being off PPI therapy due to her GERD symptoms but is willing to do whatever it takes.  Patient is not taking significant nonsteroidals or BC/Goody powders.  Patient has never had an EGD.  GI Review of Systems Positive as above Negative  for dysphagia, odynophagia, nausea, vomiting, early satiety, abdominal pain, change in bowel habits, melena, hematochezia  Review of Systems General: Denies fevers/chills/weight loss unintentionally Cardiovascular: Denies chest pain/palpitations Pulmonary: Denies shortness of breath Gastroenterological: See HPI Genitourinary: Denies darkened urine Hematological: Denies easy bruising/bleeding Dermatological: Denies jaundice Psychological: Mood is stable   Medications Current Outpatient Medications  Medication Sig Dispense Refill  . citalopram (CELEXA) 20 MG tablet Take 1 tablet (20 mg total) by mouth daily. 90 tablet 1  . Glycerin-Hypromellose-PEG 400 ([facility] DRY EYE RELIEF) 0.2-0.2-1 % SOLN Place 2 drops into both eyes daily as needed (Dry eye).    Marland Kitchen losartan (COZAAR) 50 MG tablet Take 1 tablet (50 mg total) by mouth daily. 90 tablet 3  . omeprazole (PRILOSEC) 20 MG capsule Take 1 capsule (20 mg total) by mouth daily. 90 capsule 3  . rosuvastatin (CRESTOR) 10 MG tablet Take 1 tablet (10 mg total) by mouth daily. 90 tablet 3  . temazepam (RESTORIL) 15 MG capsule Take 1 capsule (15 mg total) by mouth at bedtime as needed for sleep. 30 capsule 1   No current facility-administered medications for this visit.    Allergies Allergies  Allergen Reactions  . Lisinopril Cough  . Morphine Sulfate     REACTION: vomiting, SOB  . Progesterone Nausea Only    Histories Past Medical History:  Diagnosis Date  . Ectopic pregnancy    x 3  . Essential hypertension   . GERD (gastroesophageal reflux disease)   . Hyperlipidemia    Past Surgical History:  Procedure Laterality Date  .  COLONOSCOPY  06/2020  . ECTOPIC PREGNANCY SURGERY    . ENDOSCOPIC MUCOSAL RESECTION  08/14/2020   Procedure: ENDOSCOPIC MUCOSAL RESECTION;  Surgeon: Rush Landmark Telford Nab., MD;  Location: [place];  Service: Gastroenterology;;  . EUS N/A 08/14/2020   Procedure: LOWER ENDOSCOPIC ULTRASOUND (EUS);  Surgeon:  Irving Copas., MD;  Location: [place];  Service: Gastroenterology;  Laterality: N/A;  . FLEXIBLE SIGMOIDOSCOPY N/A 08/14/2020   Procedure: FLEXIBLE SIGMOIDOSCOPY;  Surgeon: Rush Landmark Telford Nab., MD;  Location: [place];  Service: Gastroenterology;  Laterality: N/A;  . HEMOSTASIS CLIP PLACEMENT  08/14/2020   Procedure: HEMOSTASIS CLIP PLACEMENT;  Surgeon: Irving Copas., MD;  Location: [place];  Service: Gastroenterology;;  . Index INJECTION  08/14/2020   Procedure: SUBMUCOSAL LIFTING INJECTION;  Surgeon: Irving Copas., MD;  Location: [facility] ENDOSCOPY;  Service: Gastroenterology;;  . [facility]  2008 & 2010   Social History   Socioeconomic History  . Marital status: Significant Other    Spouse name: Not on file  . Number of children: Not on file  . Years of education: Not on file  . Highest education level: Not on file  Occupational History  . Not on file  Tobacco Use  . Smoking status: Current Some Day Smoker    Types: Cigarettes  . Smokeless tobacco: Never Used  . Tobacco comment: socially  Vaping Use  . Vaping Use: Never used  Substance and Sexual Activity  . Alcohol use: Yes    Alcohol/week: 12.0 standard drinks    Types: 12 Standard drinks or equivalent per week  . Drug use: No  . Sexual activity: Not on file  Other Topics Concern  . Not on file  Social History Narrative  . Not on file   Social Determinants of Health   Financial Resource Strain: Not on file  Food Insecurity: Not on file  Transportation Needs: Not on file  Physical Activity: Not on file  Stress: Not on file  Social Connections: Not on file  Intimate Partner Violence: Not on file   Family History  Problem Relation Age of Onset  . Hypertension Mother   . Hypertension Brother   . Osteoarthritis Father   . Breast cancer Other   . Cancer Maternal Grandmother        unknown  . Cancer Maternal Grandfather        unknown  . Cancer Paternal  Grandmother        unknown  . Cancer Paternal Grandfather        unknowon  . Colon cancer Neg Hx   . Colon polyps Neg Hx   . Esophageal cancer Neg Hx   . Rectal cancer Neg Hx   . Stomach cancer Neg Hx   . Inflammatory bowel disease Neg Hx   . Liver disease Neg Hx   . Pancreatic cancer Neg Hx    I have reviewed her medical, social, and family history in detail and updated the electronic medical record as necessary.    PHYSICAL EXAMINATION  BP 114/78   Pulse 94   Ht $R'5\' 7"'av$  (1.702 m)   Wt 281 lb 3.2 oz (127.6 kg)   LMP 08/23/2013   SpO2 99%   BMI 44.04 kg/m  Wt Readings from Last 3 Encounters:  10/03/20 281 lb 3.2 oz (127.6 kg)  08/14/20 270 lb (122.5 kg)  07/21/20 276 lb (125.2 kg)  GEN: NAD, appears stated age, doesn't appear chronically ill PSYCH: Cooperative, without pressured speech EYE: Conjunctivae pink, sclerae  anicteric ENT: Masked CV: Nontachycardic RESP: No audible wheezing GI: NABS, soft, protuberant abdomen, rounded, obese, nontender, without rebound MSK/EXT: Lower extremity edema present SKIN: No jaundice NEURO:  Alert & Oriented x 3, no focal deficits   REVIEW OF DATA  I reviewed the following data at the time of this encounter:  GI Procedures and Studies  November 2021 colonoscopy - Perianal skin tags found on perianal exam. - Hemorrhoids found on digital rectal exam. - The examined portion of the ileum was normal. - Six 2 to 6 mm polyps in the rectum, in the sigmoid colon, in the descending colon and in the ascending colon, removed with a cold snare. Resected and retrieved. - Diverticulosis in the recto-sigmoid colon and in the sigmoid colon. - Normal mucosa in the entire examined colon otherwise. - Non-bleeding non-thrombosed external and internal hemorrhoids. Pathology Diagnosis Surgical [P], colon, ascending, descending, sigmoid, rectal, polyp (6) - WELL DIFFERENTIATED NEUROENDOCRINE TUMOR (CARCINOID), SEE COMMENT. - TUBULAR ADENOMA (X5  FRAGMENTS). - NO HIGH GRADE DYSPLASIA.  January 2022 lower EUS Flexible Sigmoidoscopy Impression: - Hemorrhoids found on digital rectal exam. - Stool in the entire examined colon. Lavaged with adequate visualization. - Diverticulosis in the recto-sigmoid colon and in the sigmoid colon. - Post-polypectomy scar in the mid rectum. After EUS, mucosal resection performed of scar (via Cap/Band/Snare technique) to ensure complete removal of NET. Clips (MR conditional) were placed to decrease risk of bleeding. - Non-bleeding non-thrombosed external and internal hemorrhoids. EUS Impression: - Wall thickening was visualized endosonographically in the rectum. This was due to increased thickness of the luminal interface/superficial mucosa (Layer 1) and deep mucosa (Layer 2). This is in area of scar site. - Endosonographic images of the rectum were unremarkable. - No malignant-appearing lymph nodes were visualized endosonographically in the perirectal region and in the left iliac region. - Endosonographic images of the perirectal space were unremarkable. - The internal anal sphincter was visualized endosonographically and appeared normal. Pathology FINAL MICROSCOPIC DIAGNOSIS:  A. RECTUM, SCAR LESION, BIOPSY:  - Fibrosis consistent with scar. No residual neuroendocrine tumor  identified.   Laboratory Studies  Reviewed those in epic  Imaging Studies  November 2021 CT abdomen pelvis with contrast IMPRESSION: 1. Rectal lesion is difficult to visualize due to unprepped colon and lack of enteric contrast material within the distal colon1. 2. No findings of metastatic disease within the abdomen or pelvis. 3. Fat containing left inguinal hernia.   ASSESSMENT  Holly Horn is a 50 y.o. 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  The patient is seen today for evaluation and  management of:  1. Benign neuroendocrine tumor of rectum   2. Elevated chromogranin A   3. Hx of adenomatous colonic polyps   4. Gastroesophageal reflux disease, unspecified whether esophagitis present    The patient is clinically and hemodynamically stable at this time.  She has had evidence of a rectal neuroendocrine tumor that is status post endoscopic resection and subcentimeter in size.  The majority of these lesions will end up being benign and not invasive though monitoring is not unreasonable in the course of the next few years.  There are no strict guidelines in regards to overall follow-up but in the setting of her history as well as the elevated chromogranin A (with hope that this was elevated due to PPI use) we will recheck her chromogranin a level in the course of the next few weeks while  off of PPI therapy.  She will stop PPI for the next 2 weeks and then come in for a laboratory evaluation.  If the chromogranin a level has normalized then we will plan for the 1 year follow-up lower EUS to monitor the site of previous resection.  If an elevated chromogranin A is found, we may need to perform a dotatate Netspot but we will cross that path if necessary.  The patient has never had an upper endoscopy and has had GERD symptoms for years.  She has no red flag symptoms.  We will consider a diagnostic endoscopy at the time of her follow-up lower EUS in January 2023.  I have discussed lifestyle modifications with the patient in regards to her GERD as well.  All patient questions were answered to the best of my ability, and the patient agrees to the aforementioned plan of action with follow-up as indicated.   PLAN  Hold PPI for next 2 weeks Obtain chromogranin a level -If elevated will need to proceed with dotatate Netspot as next step to ensure no evidence of other neuroendocrine tumor presents -If normalized we will plan 1 year lower EUS for follow-up Plan will be for potentially 3 to 5-year  follow-up lower EUS procedures Consideration of diagnostic endoscopy at time of next EUS due to longstanding history of GERD will be considered Full colonoscopy will be due in 2024   Orders Placed This Encounter  Procedures  . Chromogranin A    New Prescriptions   No medications on file   Modified Medications   No medications on file    Planned Follow Up No follow-ups on file.   Total Time in Face-to-Face and in Coordination of Care for patient including independent/personal interpretation/review of prior testing, medical history, examination, medication adjustment, communicating results with the patient directly, and documentation with the EHR is 30 minutes.   Justice Britain, MD [facility] Office # [phone]

## 2020-10-03 NOTE — Patient Instructions (Addendum)
Stop Omeprazole for 10 days.   Your provider has requested that you go to the basement level for lab in 10 days. ( 10/14/20) Press "B" on the elevator. The lab is located at the first door on the left as you exit the elevator.  You may resume Omeprazole after you have submitted labs.  If you are age 51 or older, your body mass index should be between 23-30. Your Body mass index is 44.04 kg/m. If this is out of the aforementioned range listed, please consider follow up with your Primary Care Provider.  If you are age 39 or younger, your body mass index should be between 19-25. Your Body mass index is 44.04 kg/m. If this is out of the aformentioned range listed, please consider follow up with your Primary Care Provider.    Thank you for choosing me and [facility].  Dr. Rush Landmark

## 2020-10-07 ENCOUNTER — Encounter: Payer: Self-pay | Admitting: Gastroenterology

## 2020-10-07 DIAGNOSIS — D3A8 Other benign neuroendocrine tumors: Secondary | ICD-10-CM | POA: Insufficient documentation

## 2020-10-07 DIAGNOSIS — K219 Gastro-esophageal reflux disease without esophagitis: Secondary | ICD-10-CM | POA: Insufficient documentation

## 2020-10-07 DIAGNOSIS — Z86010 Personal history of colonic polyps: Secondary | ICD-10-CM | POA: Insufficient documentation

## 2020-10-07 DIAGNOSIS — R899 Unspecified abnormal finding in specimens from other organs, systems and tissues: Secondary | ICD-10-CM | POA: Insufficient documentation

## 2020-10-14 ENCOUNTER — Other Ambulatory Visit: Payer: PRIVATE HEALTH INSURANCE

## 2020-10-14 DIAGNOSIS — Z86010 Personal history of colonic polyps: Secondary | ICD-10-CM

## 2020-10-14 DIAGNOSIS — D3A8 Other benign neuroendocrine tumors: Secondary | ICD-10-CM

## 2020-10-17 ENCOUNTER — Telehealth: Payer: Self-pay | Admitting: Gastroenterology

## 2020-10-17 LAB — CHROMOGRANIN A: Chromogranin A (ng/mL): 237.7 ng/mL — ABNORMAL HIGH (ref 0.0–101.8)

## 2020-10-17 NOTE — Telephone Encounter (Signed)
Inbound call from patient requesting a call back from a nurse in regards to her lab results please.

## 2020-10-17 NOTE — Telephone Encounter (Signed)
The pt has been advised that as soon as Dr Rush Landmark reviews the labs we will contact her via phone or My Chart.  The pt has been advised of the information and verbalized understanding.

## 2020-10-30 ENCOUNTER — Other Ambulatory Visit: Payer: Self-pay

## 2020-10-30 ENCOUNTER — Encounter: Payer: Self-pay | Admitting: Adult Health

## 2020-10-30 ENCOUNTER — Ambulatory Visit (INDEPENDENT_AMBULATORY_CARE_PROVIDER_SITE_OTHER): Payer: PRIVATE HEALTH INSURANCE | Admitting: Adult Health

## 2020-10-30 VITALS — BP 142/90 | HR 85 | Temp 97.5°F | Wt 283.8 lb

## 2020-10-30 DIAGNOSIS — F419 Anxiety disorder, unspecified: Secondary | ICD-10-CM

## 2020-10-30 DIAGNOSIS — Z6841 Body Mass Index (BMI) 40.0 and over, adult: Secondary | ICD-10-CM

## 2020-10-30 DIAGNOSIS — F32A Depression, unspecified: Secondary | ICD-10-CM

## 2020-10-30 DIAGNOSIS — M7712 Lateral epicondylitis, left elbow: Secondary | ICD-10-CM | POA: Diagnosis not present

## 2020-10-30 MED ORDER — CITALOPRAM HYDROBROMIDE 40 MG PO TABS: 40.0000 mg | ORAL_TABLET | Freq: Every day | ORAL | 0 refills | Status: DC

## 2020-10-30 MED ORDER — PHENTERMINE HCL 15 MG PO CAPS: 15.0000 mg | ORAL_CAPSULE | ORAL | 0 refills | Status: DC

## 2020-10-30 NOTE — Progress Notes (Signed)
Subjective:    Patient ID: Holly Horn, female    DOB: [birth year], 50 y.o.   MRN: [id number]  HPI 50 year old female who  has a past medical history of Ectopic pregnancy, Essential hypertension, GERD (gastroesophageal reflux disease), and Hyperlipidemia.  She presents to the clinic today with multiple issues   1. Left sided elbow pain and swelling -been present for roughly 3 weeks.  Is trauma or aggravating injury.  She feels as though there is a mass and that it is warm to touch.  Has not been using any over-the-counter medications to help with pain relief  2. Worsening Depression -reports that her father passed away recently, and also has a few friends who have been diagnosed with cancer or that have died recently.  She reports that her depression has been worse since her father passed away.  Currently on Celexa 20 mg and would like to increase this if possible. No SI   3. Weight loss Management -placed on phentermine 15 mg back in December 2021.  She has not been seen for follow-up since due to her father being in the hospital and ultimately succumbing to illness.  She has not been focusing on a heart healthy diet as she was having to travel from [place] to [place] on almost a daily basis and was eating fast food and meals out of the vending machine.  She does want to get back into her weight loss journey and would like to restart phentermine  Wt Readings from Last 3 Encounters:  10/30/20 283 lb 12.8 oz (128.7 kg)  10/03/20 281 lb 3.2 oz (127.6 kg)  08/14/20 270 lb (122.5 kg)     Review of Systems See HPI   Past Medical History:  Diagnosis Date  . Ectopic pregnancy    x 3  . Essential hypertension   . GERD (gastroesophageal reflux disease)   . Hyperlipidemia     Social History   Socioeconomic History  . Marital status: Significant Other    Spouse name: Not on file  . Number of children: Not on file  . Years of education: Not on file  . Highest education level:  Not on file  Occupational History  . Not on file  Tobacco Use  . Smoking status: Current Some Day Smoker    Types: Cigarettes  . Smokeless tobacco: Never Used  . Tobacco comment: socially  Vaping Use  . Vaping Use: Never used  Substance and Sexual Activity  . Alcohol use: Yes    Alcohol/week: 12.0 standard drinks    Types: 12 Standard drinks or equivalent per week  . Drug use: No  . Sexual activity: Not on file  Other Topics Concern  . Not on file  Social History Narrative  . Not on file   Social Determinants of Health   Financial Resource Strain: Not on file  Food Insecurity: Not on file  Transportation Needs: Not on file  Physical Activity: Not on file  Stress: Not on file  Social Connections: Not on file  Intimate Partner Violence: Not on file    Past Surgical History:  Procedure Laterality Date  . COLONOSCOPY  06/2020  . ECTOPIC PREGNANCY SURGERY    . ENDOSCOPIC MUCOSAL RESECTION  08/14/2020   Procedure: ENDOSCOPIC MUCOSAL RESECTION;  Surgeon: Rush Landmark Telford Nab., MD;  Location: [place];  Service: Gastroenterology;;  . EUS N/A 08/14/2020   Procedure: LOWER ENDOSCOPIC ULTRASOUND (EUS);  Surgeon: Rush Landmark Telford Nab., MD;  Location: [place];  Service:  Gastroenterology;  Laterality: N/A;  . FLEXIBLE SIGMOIDOSCOPY N/A 08/14/2020   Procedure: FLEXIBLE SIGMOIDOSCOPY;  Surgeon: Rush Landmark Telford Nab., MD;  Location: [place];  Service: Gastroenterology;  Laterality: N/A;  . HEMOSTASIS CLIP PLACEMENT  08/14/2020   Procedure: HEMOSTASIS CLIP PLACEMENT;  Surgeon: Irving Copas., MD;  Location: [place];  Service: Gastroenterology;;  . Lia Foyer LIFTING INJECTION  08/14/2020   Procedure: SUBMUCOSAL LIFTING INJECTION;  Surgeon: Irving Copas., MD;  Location: [place];  Service: Gastroenterology;;  . Christy Gentles Vein Surgery  2008 & 2010    Family History  Problem Relation Age of Onset  . Hypertension Mother   . Hypertension Brother   .  Osteoarthritis Father   . Breast cancer Other   . Cancer Maternal Grandmother        unknown  . Cancer Maternal Grandfather        unknown  . Cancer Paternal Grandmother        unknown  . Cancer Paternal Grandfather        unknowon  . Colon cancer Neg Hx   . Colon polyps Neg Hx   . Esophageal cancer Neg Hx   . Rectal cancer Neg Hx   . Stomach cancer Neg Hx   . Inflammatory bowel disease Neg Hx   . Liver disease Neg Hx   . Pancreatic cancer Neg Hx     Allergies  Allergen Reactions  . Lisinopril Cough  . Morphine Sulfate     REACTION: vomiting, SOB  . Progesterone Nausea Only    Current Outpatient Medications on File Prior to Visit  Medication Sig Dispense Refill  . Glycerin-Hypromellose-PEG 400 ([facility] DRY EYE RELIEF) 0.2-0.2-1 % SOLN Place 2 drops into both eyes daily as needed (Dry eye).    Marland Kitchen losartan (COZAAR) 50 MG tablet Take 1 tablet (50 mg total) by mouth daily. 90 tablet 3  . omeprazole (PRILOSEC) 20 MG capsule Take 1 capsule (20 mg total) by mouth daily. 90 capsule 3  . rosuvastatin (CRESTOR) 10 MG tablet Take 1 tablet (10 mg total) by mouth daily. 90 tablet 3  . temazepam (RESTORIL) 15 MG capsule Take 1 capsule (15 mg total) by mouth at bedtime as needed for sleep. 30 capsule 1   No current facility-administered medications on file prior to visit.    BP (!) 142/90 (BP Location: Left Arm, Patient Position: Sitting, Cuff Size: Normal)   Pulse 85   Temp (!) 97.5 F (36.4 C) (Oral)   Wt 283 lb 12.8 oz (128.7 kg)   LMP 08/23/2013   SpO2 97%   BMI 44.45 kg/m       Objective:   Physical Exam Vitals and nursing note reviewed.  Constitutional:      Appearance: Normal appearance.  Cardiovascular:     Heart sounds: Normal heart sounds.  Musculoskeletal:        General: Tenderness present.     Right elbow: Normal.     Left elbow: No swelling, deformity or effusion. Normal range of motion. Tenderness present in lateral epicondyle. No radial head, medial epicondyle  or olecranon process tenderness.     Comments: No mass, redness, or warmth noted to left elbow or upper arm    Skin:    General: Skin is warm and dry.  Neurological:     General: No focal deficit present.     Mental Status: She is alert and oriented to person, place, and time.  Psychiatric:        Mood and Affect: Mood  normal.        Behavior: Behavior normal.        Thought Content: Thought content normal.        Judgment: Judgment normal.       Assessment & Plan:  1. Anxiety and depression - Will increase Celexa to 40 mg. Follow up in one month  - If signs of SI present then she is to go to the ER  - citalopram (CELEXA) 40 MG tablet; Take 1 tablet (40 mg total) by mouth daily.  Dispense: 90 tablet; Refill: 0  2. Class 3 severe obesity with serious comorbidity and body mass index (BMI) of 40.0 to 44.9 in adult, unspecified obesity type (HCC)  - phentermine 15 MG capsule; Take 1 capsule (15 mg total) by mouth every morning.  Dispense: 30 capsule; Refill: 0 - Follow up in one month   3. Lateral epicondylitis of left elbow -Advised Ace bandage, NSAIDs, rest, and ice.  Dorothyann Peng, NP

## 2020-11-10 ENCOUNTER — Other Ambulatory Visit: Payer: Self-pay | Admitting: Adult Health

## 2020-11-10 DIAGNOSIS — F32A Depression, unspecified: Secondary | ICD-10-CM

## 2020-11-12 ENCOUNTER — Other Ambulatory Visit: Payer: Self-pay

## 2020-11-12 NOTE — Progress Notes (Signed)
The proposed treatment discussed in conference is for discussion purposes only and is not a binding recommendation.  The patients have not been physically examined, or presented with their treatment options.  Therefore, final treatment plans cannot be decided.   

## 2020-11-26 ENCOUNTER — Telehealth: Payer: Self-pay | Admitting: Gastroenterology

## 2020-11-27 ENCOUNTER — Telehealth: Payer: Self-pay | Admitting: Gastroenterology

## 2020-11-27 NOTE — Telephone Encounter (Signed)
Patient would like a call back to discuss another procedure based off her high blood levels. She was uncertain what it was and want to research beforehand. Her best contact number [phone]

## 2020-11-27 NOTE — Telephone Encounter (Signed)
Separate result note to be sent to you.  I spoke with her yesterday and again today.  Thanks. GM

## 2020-11-27 NOTE — Telephone Encounter (Signed)
Dr Rush Landmark the pt would like a call back to discuss chromogranin A results.  She questions if she needs another procedure.  She has asked to speak directly to you.

## 2020-11-28 ENCOUNTER — Other Ambulatory Visit: Payer: Self-pay

## 2020-11-28 DIAGNOSIS — D3A8 Other benign neuroendocrine tumors: Secondary | ICD-10-CM

## 2020-11-28 DIAGNOSIS — R899 Unspecified abnormal finding in specimens from other organs, systems and tissues: Secondary | ICD-10-CM

## 2020-11-28 NOTE — Telephone Encounter (Signed)
Order entered will call [phone] to set up after we get prior auth.  Amy can you let me know when this is approved?

## 2020-11-28 NOTE — Telephone Encounter (Signed)
I called and spoke with the patient about the recent follow-up chromogranin A levels still remaining elevated. Her case was discussed at multidisciplinary conference. In the setting of the negative resection, I suspect that we will not have issues of a persistent or recurrent neuroendocrine tumor but with the elevation in the chromogranin still being present and my prior finding of a few patients who have had neuroendocrine tumors in other areas we discussed the potential role of a dotatate Netspot scan. After discussion with oncology and radiology and surgery as well as GI we decided to move forward with the dotatate scan if we can get approval for it. This will help [place] discern whether the patient has any evidence of neuroendocrine cells in any other portion of the body. Her chromogranin A level will be rechecked in a few months. After the dotatate scan has been completed, we will consider referral to oncology if there are any other abnormalities otherwise we will plan to monitor the area with surveillance EUS as necessary. Patient aware of this particular plan and will await the dotatate scan being scheduled.  Patty, move forward with scheduling a NETSPOT DOTATATE Scan for history of rectal neuroendocrine tumor and elevated chromogranin A level.  Thanks.  Justice Britain, MD [facility] Office # [phone]

## 2020-12-02 ENCOUNTER — Ambulatory Visit: Payer: PRIVATE HEALTH INSURANCE | Admitting: Adult Health

## 2020-12-02 ENCOUNTER — Other Ambulatory Visit: Payer: Self-pay

## 2020-12-02 ENCOUNTER — Encounter: Payer: Self-pay | Admitting: Adult Health

## 2020-12-02 VITALS — BP 138/86 | HR 94 | Temp 98.2°F | Wt 275.6 lb

## 2020-12-02 DIAGNOSIS — Z6841 Body Mass Index (BMI) 40.0 and over, adult: Secondary | ICD-10-CM | POA: Diagnosis not present

## 2020-12-02 MED ORDER — PHENTERMINE HCL 15 MG PO CAPS: 15.0000 mg | ORAL_CAPSULE | ORAL | 2 refills | Status: DC

## 2020-12-02 NOTE — Telephone Encounter (Signed)
Auth# [id number] good 12/02/20-03/02/21 for WL Patient is authorized for WL.  She can be scheduled.

## 2020-12-02 NOTE — Telephone Encounter (Signed)
The pt has been scheduled for dototate scan in 12/11/20 at 130 pm at [facility].  The pt has been advised

## 2020-12-02 NOTE — Progress Notes (Signed)
Subjective:    Patient ID: Holly Horn, female    DOB: February 10, 1971, 50 y.o.   MRN: [id number]  HPI 50 year old female who  has a past medical history of Ectopic pregnancy, Essential hypertension, GERD (gastroesophageal reflux disease), and Hyperlipidemia.   She presents to the office today for follow up regarding obesity management. When she was last seen on 10/30/2020. She was restarted on Phentermine 15 mg.  She reports that today she is in a better place than she was a month ago.  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  She is focusing more on her health, eating healthier and trying to stay more active.  She denies any side effects from the phentermine.  She is currently being evaluated by gastroenterology for benign neuroendocrine tumor of the rectum, she is worried about finances for coming treatment but will make it work somehow.  Wt Readings from Last 3 Encounters:  12/02/20 275 lb 9.6 oz (125 kg)  10/30/20 283 lb 12.8 oz (128.7 kg)  10/03/20 281 lb 3.2 oz (127.6 kg)   Review of Systems See HPI   Past Medical History:  Diagnosis Date  . Ectopic pregnancy    x 3  . Essential hypertension   . GERD (gastroesophageal reflux disease)   . Hyperlipidemia     Social History   Socioeconomic History  . Marital status: Significant Other    Spouse name: Not on file  . Number of children: Not on file  . Years of education: Not on file  . Highest education level: Not on file  Occupational History  . Not on file  Tobacco Use  . Smoking status: Current Some Day Smoker    Types: Cigarettes  . Smokeless tobacco: Never Used  . Tobacco comment: socially  Vaping Use  . Vaping Use: Never used  Substance and Sexual Activity  . Alcohol use: Yes    Alcohol/week: 12.0 standard drinks    Types: 12 Standard drinks or equivalent per week  . Drug use: No  . Sexual activity: Not on file  Other Topics Concern  . Not on file   Social History Narrative  . Not on file   Social Determinants of Health   Financial Resource Strain: Not on file  Food Insecurity: Not on file  Transportation Needs: Not on file  Physical Activity: Not on file  Stress: Not on file  Social Connections: Not on file  Intimate Partner Violence: Not on file    Past Surgical History:  Procedure Laterality Date  . COLONOSCOPY  06/2020  . ECTOPIC PREGNANCY SURGERY    . ENDOSCOPIC MUCOSAL RESECTION  08/14/2020   Procedure: ENDOSCOPIC MUCOSAL RESECTION;  Surgeon: Rush Landmark Telford Nab., MD;  Location: [place];  Service: Gastroenterology;;  . EUS N/A 08/14/2020   Procedure: LOWER ENDOSCOPIC ULTRASOUND (EUS);  Surgeon: Irving Copas., MD;  Location: [place];  Service: Gastroenterology;  Laterality: N/A;  . FLEXIBLE SIGMOIDOSCOPY N/A 08/14/2020   Procedure: FLEXIBLE SIGMOIDOSCOPY;  Surgeon: Rush Landmark Telford Nab., MD;  Location: [place];  Service: Gastroenterology;  Laterality: N/A;  . HEMOSTASIS CLIP PLACEMENT  08/14/2020   Procedure: HEMOSTASIS CLIP PLACEMENT;  Surgeon: Irving Copas., MD;  Location: [place];  Service: Gastroenterology;;  . Lia Foyer LIFTING INJECTION  08/14/2020   Procedure: SUBMUCOSAL LIFTING INJECTION;  Surgeon: Irving Copas., MD;  Location: [facility];  Service: Gastroenterology;;  . Christy Gentles Vein Surgery  2008 & 2010  Family History  Problem Relation Age of Onset  . Hypertension Mother   . Hypertension Brother   . Osteoarthritis Father   . Breast cancer Other   . Cancer Maternal Grandmother        unknown  . Cancer Maternal Grandfather        unknown  . Cancer Paternal Grandmother        unknown  . Cancer Paternal Grandfather        unknowon  . Colon cancer Neg Hx   . Colon polyps Neg Hx   . Esophageal cancer Neg Hx   . Rectal cancer Neg Hx   . Stomach cancer Neg Hx   . Inflammatory bowel disease Neg Hx   . Liver disease Neg Hx   . Pancreatic cancer Neg Hx      Allergies  Allergen Reactions  . Lisinopril Cough  . Morphine Sulfate     REACTION: vomiting, SOB  . Progesterone Nausea Only    Current Outpatient Medications on File Prior to Visit  Medication Sig Dispense Refill  . citalopram (CELEXA) 40 MG tablet Take 1 tablet (40 mg total) by mouth daily. 90 tablet 0  . Glycerin-Hypromellose-PEG 400 ([facility] DRY EYE RELIEF) 0.2-0.2-1 % SOLN Place 2 drops into both eyes daily as needed (Dry eye).    Marland Kitchen losartan (COZAAR) 50 MG tablet Take 1 tablet (50 mg total) by mouth daily. 90 tablet 3  . omeprazole (PRILOSEC) 20 MG capsule Take 1 capsule (20 mg total) by mouth daily. 90 capsule 3  . rosuvastatin (CRESTOR) 10 MG tablet Take 1 tablet (10 mg total) by mouth daily. 90 tablet 3  . temazepam (RESTORIL) 15 MG capsule Take 1 capsule (15 mg total) by mouth at bedtime as needed for sleep. 30 capsule 1   No current facility-administered medications on file prior to visit.    BP 138/86 (BP Location: Left Arm, Patient Position: Sitting, Cuff Size: Normal)   Pulse 94   Temp 98.2 F (36.8 C) (Oral)   Wt 275 lb 9.6 oz (125 kg)   LMP 08/23/2013   SpO2 96%   BMI 43.17 kg/m       Objective:   Physical Exam Vitals and nursing note reviewed.  Constitutional:      Appearance: Normal appearance. She is obese.  Cardiovascular:     Rate and Rhythm: Normal rate and regular rhythm.     Pulses: Normal pulses.     Heart sounds: Normal heart sounds.  Pulmonary:     Effort: Pulmonary effort is normal.     Breath sounds: Normal breath sounds.  Skin:    General: Skin is warm and dry.     Capillary Refill: Capillary refill takes less than 2 seconds.  Neurological:     General: No focal deficit present.     Mental Status: She is alert and oriented to person, place, and time.  Psychiatric:        Mood and Affect: Mood normal.        Behavior: Behavior normal.       Assessment & Plan:  1. Class 3 severe obesity with serious comorbidity and body mass index  (BMI) of 40.0 to 44.9 in adult, unspecified obesity type ([place]) - She has been able to lose about 7 pounds over the last month. Encouraged to continue with lifestyle modifications  - Follow up in 3 months  - phentermine 15 MG capsule; Take 1 capsule (15 mg total) by mouth every morning.  Dispense: 30  capsule; Refill: 2  Dorothyann Peng, NP

## 2020-12-02 NOTE — Telephone Encounter (Signed)
Mansouraty, Telford Nab., MD filed at 11/28/2020 8:32 AM  Status: Signed    I called and spoke with the patient about the recent follow-up chromogranin A levels still remaining elevated. Her case was discussed at multidisciplinary conference. In the setting of the negative resection, I suspect that we will not have issues of a persistent or recurrent neuroendocrine tumor but with the elevation in the chromogranin still being present and my prior finding of a few patients who have had neuroendocrine tumors in other areas we discussed the potential role of a dotatate Netspot scan. After discussion with oncology and radiology and surgery as well as GI we decided to move forward with the dotatate scan if we can get approval for it. This will help [place] discern whether the patient has any evidence of neuroendocrine cells in any other portion of the body. Her chromogranin A level will be rechecked in a few months. After the dotatate scan has been completed, we will consider referral to oncology if there are any other abnormalities otherwise we will plan to monitor the area with surveillance EUS as necessary. Patient aware of this particular plan and will await the dotatate scan being scheduled.  Wells Mabe, move forward with scheduling a NETSPOT DOTATATE Scan for history of rectal neuroendocrine tumor and elevated chromogranin A level.  Thanks.  Justice Britain, MD [facility] Office # [phone]     Timothy Lasso, RN filed at 11/28/2020 10:29 AM  Status: Signed    Order entered will call [phone] to set up after we get prior auth.  Amy can you let me know when this is approved?      [place], Amy L filed at 12/02/2020 9:58 AM  Status: Signed    Auth# [id number] good 12/02/20-03/02/21 for WL Patient is authorized for WL.  She can be scheduled.

## 2020-12-11 ENCOUNTER — Ambulatory Visit (HOSPITAL_COMMUNITY)
Admission: RE | Admit: 2020-12-11 | Discharge: 2020-12-11 | Disposition: A | Discharge status: Home / Self Care | Payer: PRIVATE HEALTH INSURANCE | Source: Ambulatory Visit | Attending: Gastroenterology | Admitting: Gastroenterology

## 2020-12-11 ENCOUNTER — Other Ambulatory Visit: Payer: Self-pay

## 2020-12-11 DIAGNOSIS — R899 Unspecified abnormal finding in specimens from other organs, systems and tissues: Secondary | ICD-10-CM

## 2020-12-11 DIAGNOSIS — D3A8 Other benign neuroendocrine tumors: Secondary | ICD-10-CM | POA: Insufficient documentation

## 2020-12-11 IMAGING — PT NM PET SKULL BASE TO THIGH
8 series · 23 of 25 positions shown · non-contrast
Comparison: None.

CLINICAL DATA: Rectal neuroendocrine tumor.

EXAM:
NUCLEAR MEDICINE PET SKULL BASE TO THIGH
TECHNIQUE: 5.3 mCi gallium 68 DOTATATE was injected intravenously. Full-ring
PET imaging was performed from the skull base to thigh after the
radiotracer. CT data was obtained and used for attenuation
correction and anatomic localization.

[Series 3: pet sk_thigh ac · axial · 5.0mm · 4.07mm/px · z∈[-1746,-738]mm · 6 of 253 slices shown]
[im 1/253]
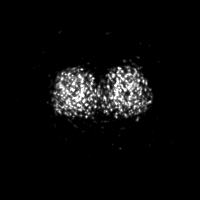
[im 51/253]
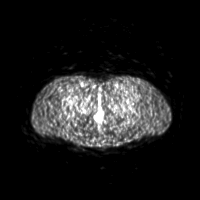
[im 101/253]
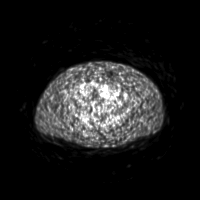
[im 152/253]
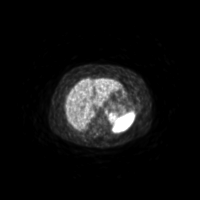
[im 202/253]
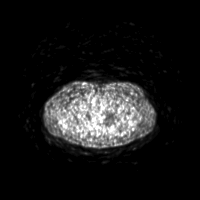
[im 253/253]
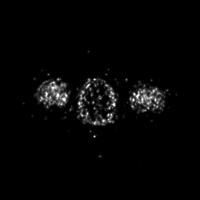

[Series 4: ct sk_thigh 5.0 hd_fov · axial · 5.0mm · 1.27mm/px · z∈[-1494,-738]mm · 4 of 253 slices shown]
[im 64/253]
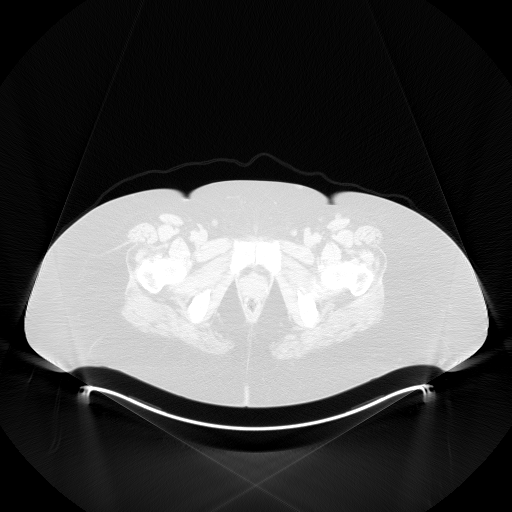
[im 127/253]
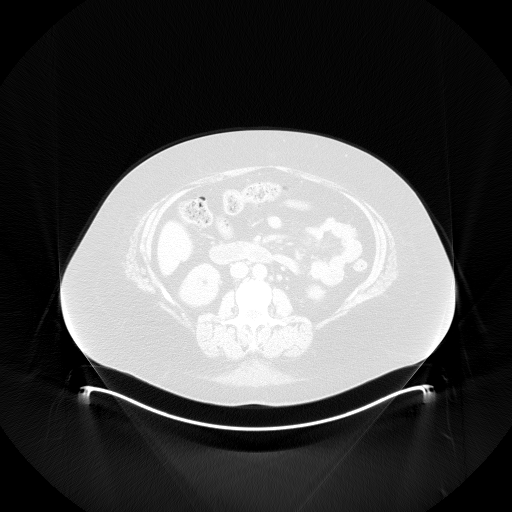
[im 190/253]
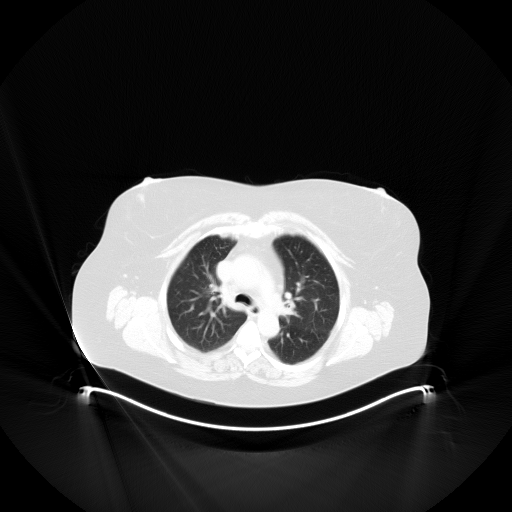
[im 253/253]
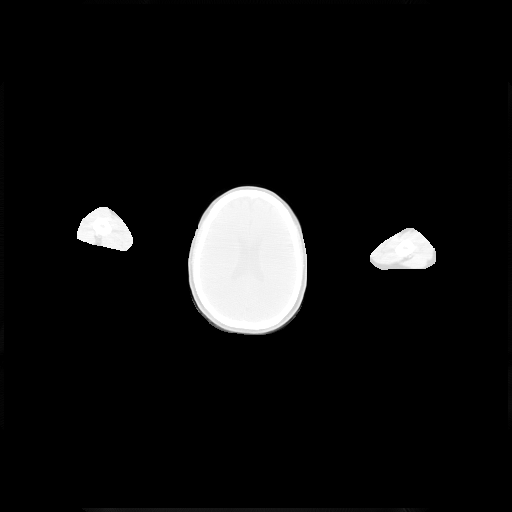

[Series 5: pet sk_thigh nac · axial · 5.0mm · 4.07mm/px · z∈[-1746,-738]mm · 5 of 253 slices shown]
[im 1/253]
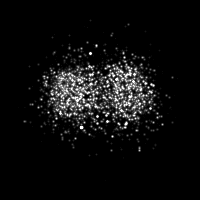
[im 64/253]
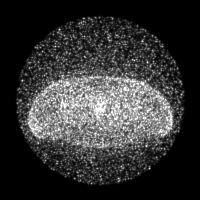
[im 127/253]
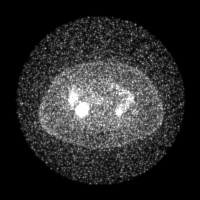
[im 190/253]
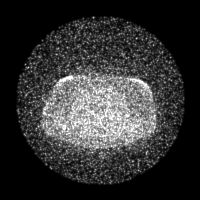
[im 253/253]
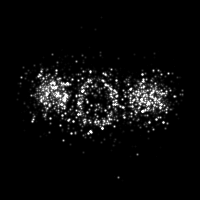

[Series 8: ct sk_thigh 5.0 br59 lung_bone · axial · 5.0mm · 0.70mm/px · 1 of 57 slices shown]
[im 1/57  soft-tissue]
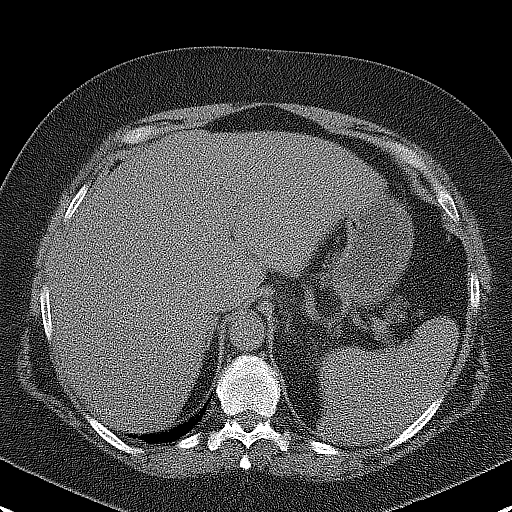

[Series 603: range-ct sk_thigh 5.0 hd_fov-tra-<alpha range> · 4 of 241 slices shown]
[im 1/241]
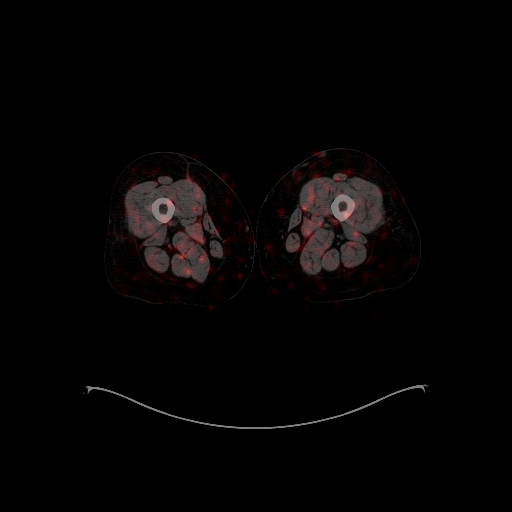
[im 121/241]
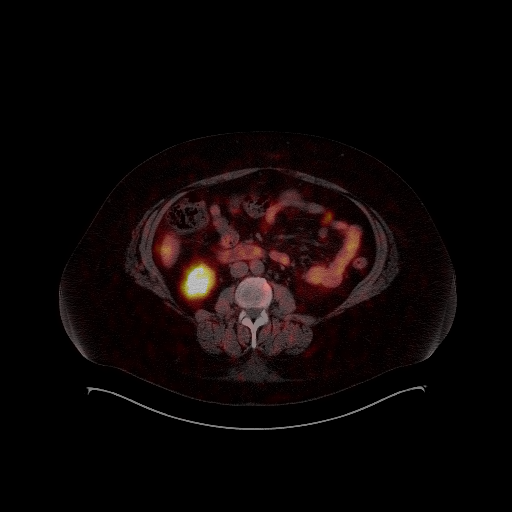
[im 181/241]
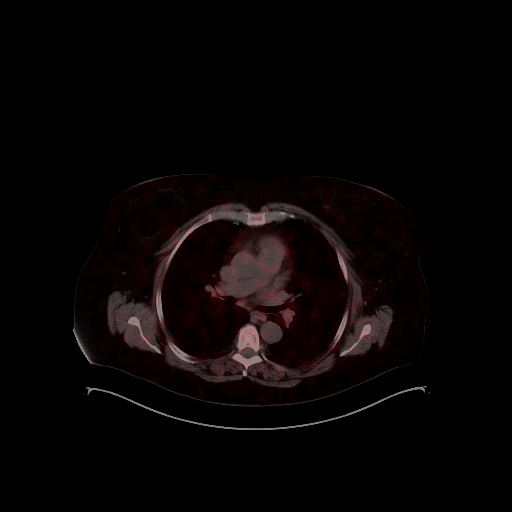
[im 241/241]
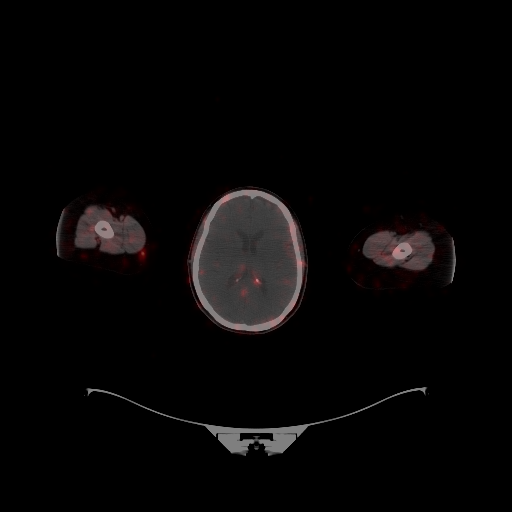

[Series 604: fused cor · 1 of 63 slices shown]
[im 1/63]
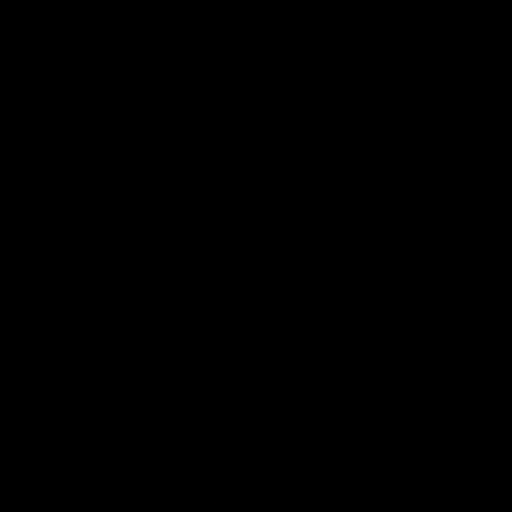

[Series 605: <mip collection> · coronal · 2.09mm/px · 1 of 32 slices shown]
[im 1/32]
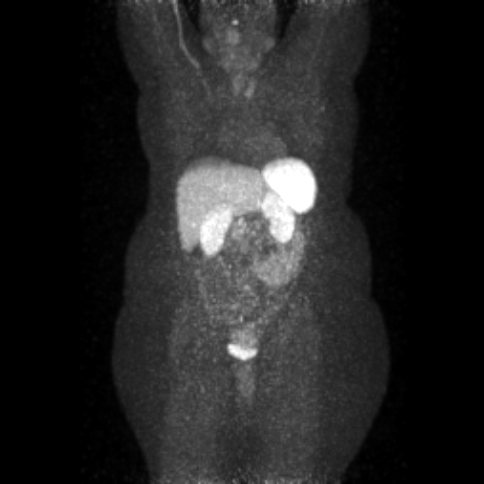

[Series 1095: results mm oncology reading · 1.3mm · 0.65mm/px · 1 of 2 slices shown]
[im 1/2]
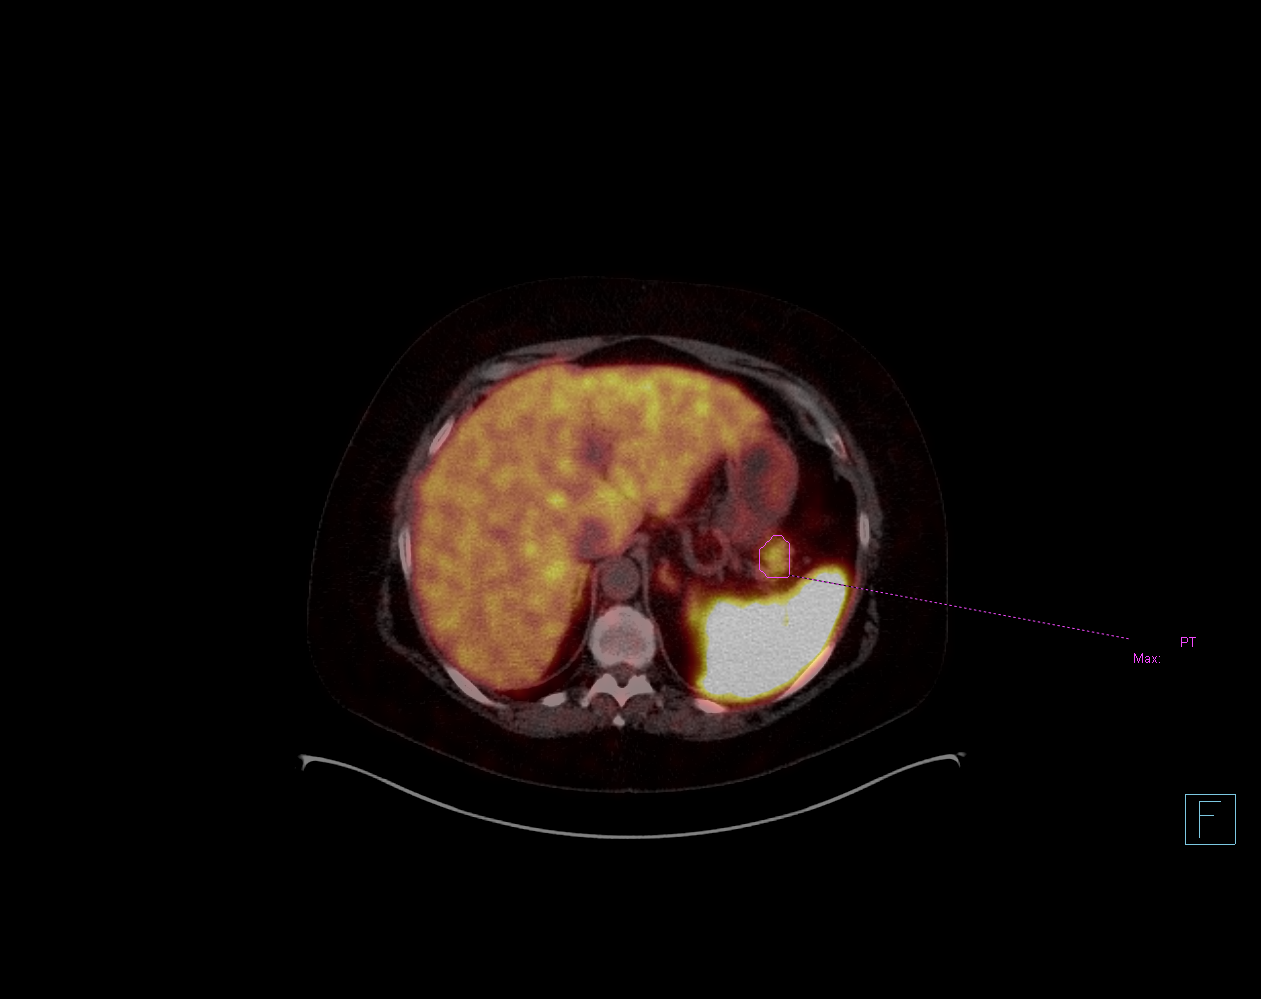

[23 of 25 positions shown; findings below may reference images not displayed]

FINDINGS: NECK

No radiotracer activity in neck lymph nodes.

Incidental CT findings: None

CHEST

No radiotracer accumulation within mediastinal or hilar lymph nodes.
No suspicious pulmonary nodules on the CT scan.

Incidental CT finding:None

ABDOMEN/PELVIS

No radiotracer activity associated with the rectum. No radiotracer
avid or enlarged pelvic lymph nodes retroperitoneal nodes. No
enlarged or radiotracer avid nodes in the abdominal mesentery.

No abnormal radiotracer activity liver.

Within the tail the pancreas there is focus of radiotracer activity
with SUV max equal 11.1. No clear lesion identified on noncontrast
CT. Lesion measures approximately 2.8 cm (image 101). No discrete
lesion identified on comparison diagnostic scan from [DATE]

Physiologic activity noted in the liver, spleen, adrenal glands and
kidneys.

Incidental CT findings:None

SKELETON

No focal activity to suggest skeletal metastasis.

Incidental CT findings:None
IMPRESSION: 1. No evidence neuroendocrine tumor of the rectum.
2. No evidence of nodal metastasis.
3. Fairly intense activity in the tail the pancreas without clear
lesion identified on CT. Favor pancreatic metastasis. MRI may
localize lesion if clinically relevant.

## 2020-12-11 MED ORDER — GALLIUM GA 68 DOTATATE IV KIT
5.3000 | PACK | Freq: Once | INTRAVENOUS | Status: AC | PRN
  Administered 2020-12-11: 5.3 via INTRAVENOUS

## 2020-12-12 ENCOUNTER — Other Ambulatory Visit: Payer: Self-pay

## 2020-12-12 DIAGNOSIS — K869 Disease of pancreas, unspecified: Secondary | ICD-10-CM

## 2020-12-12 DIAGNOSIS — K769 Liver disease, unspecified: Secondary | ICD-10-CM

## 2020-12-16 ENCOUNTER — Other Ambulatory Visit: Payer: Self-pay

## 2020-12-16 DIAGNOSIS — K869 Disease of pancreas, unspecified: Secondary | ICD-10-CM

## 2020-12-22 ENCOUNTER — Other Ambulatory Visit: Payer: Self-pay | Admitting: Gastroenterology

## 2020-12-22 DIAGNOSIS — K769 Liver disease, unspecified: Secondary | ICD-10-CM

## 2020-12-22 DIAGNOSIS — K869 Disease of pancreas, unspecified: Secondary | ICD-10-CM

## 2020-12-23 ENCOUNTER — Ambulatory Visit (HOSPITAL_COMMUNITY)
Admission: RE | Admit: 2020-12-23 | Discharge: 2020-12-23 | Disposition: A | Discharge status: Home / Self Care | Payer: PRIVATE HEALTH INSURANCE | Source: Ambulatory Visit | Attending: Gastroenterology | Admitting: Gastroenterology

## 2020-12-23 ENCOUNTER — Other Ambulatory Visit: Payer: Self-pay

## 2020-12-23 DIAGNOSIS — K869 Disease of pancreas, unspecified: Secondary | ICD-10-CM

## 2020-12-23 DIAGNOSIS — K769 Liver disease, unspecified: Secondary | ICD-10-CM

## 2020-12-23 IMAGING — MR MR ABDOMEN WO/W CM
18 of 20 series · 45 of 48 positions shown · IV contrast (with contrast)
Comparison: PET-CT on [DATE] and AP CT on [DATE]

CLINICAL DATA: Rectal neuroendocrine tumor. Pancreatic lesion
recent DOTATATE PET-CT scan.

EXAM:
MRI ABDOMEN WITHOUT AND WITH CONTRAST
TECHNIQUE: Multiplanar multisequence MR imaging of the abdomen was performed
both before and after the administration of intravenous contrast.
CONTRAST:  10mL GADAVIST GADOBUTROL 1 MMOL/ML IV SOLN

[Series 2: DWI · axial · 6.0mm · 1.49mm/px · z∈[-74,+193]mm · 4 of 76 slices shown (1 of 2)]
[im 1/76]
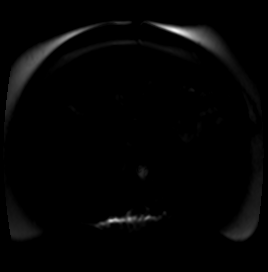
[im 26/76]
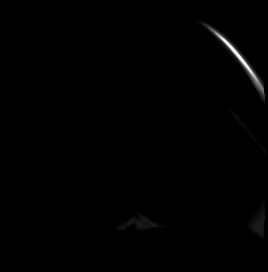
[im 51/76]
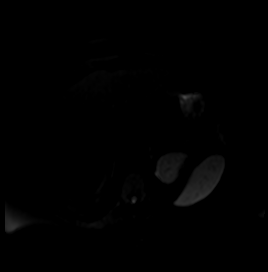
[im 76/76]
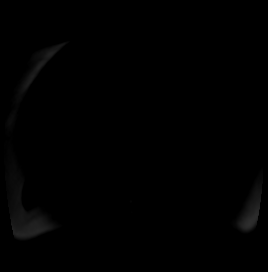

[Series 3: DWI · axial · 6.0mm · 1.49mm/px · 1 of 38 slices shown (2 of 2)]
[im 1/38]
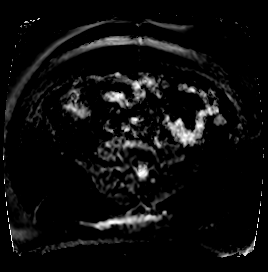

[Series 4: T2 fat-sat · axial · 6.0mm · 1.25mm/px · 1 of 39 slices shown]
[im 1/39]
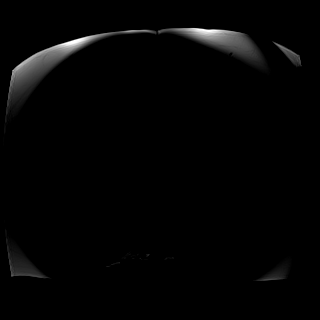

[Series 7: T2 · coronal · 7.0mm · 1.56mm/px · 1 of 36 slices shown (1 of 2)]
[im 1/36]
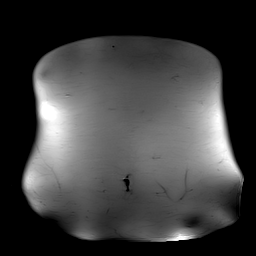

[Series 8: T1 · axial · 3.1mm · 1.25mm/px · z∈[-77,+193]mm · 3 of 88 slices shown (1 of 2)]
[im 1/88]
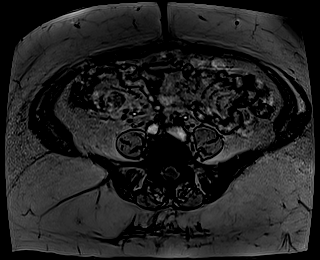
[im 44/88]
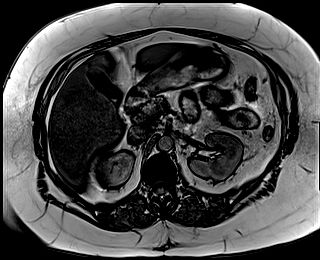
[im 88/88]
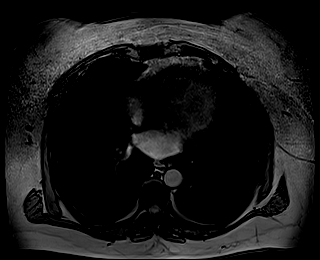

[Series 9: T1 · axial · 3.1mm · 1.25mm/px · z∈[-77,+193]mm · 3 of 88 slices shown (2 of 2)]
[im 1/88]
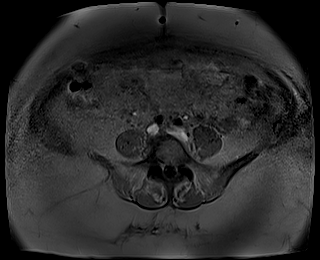
[im 44/88]
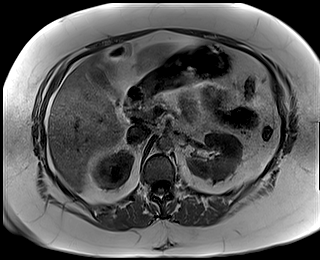
[im 88/88]
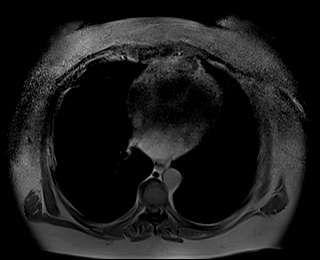

[Series 10: bSSFP · axial · 7.0mm · 1.25mm/px · 1 of 33 slices shown]
[im 1/33]
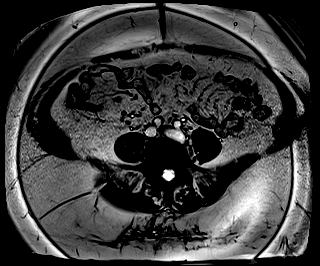

[Series 13: T1 dynamic · axial · 3.0mm · 1.25mm/px · z∈[-88,+197]mm · 3 of 96 slices shown (1 of 10)]
[im 1/96]
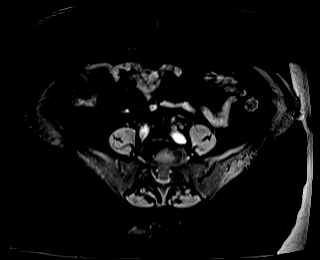
[im 48/96]
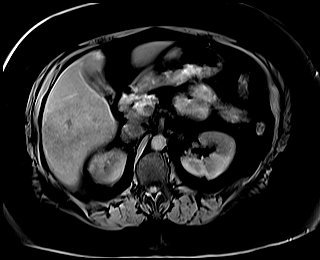
[im 96/96]
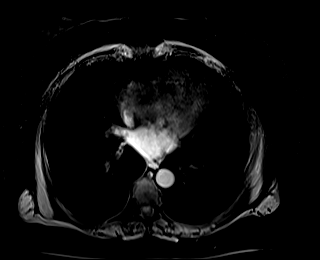

[Series 17: T1 dynamic · axial · 3.0mm · 1.25mm/px · z∈[-88,+197]mm · 3 of 96 slices shown (2 of 10)]
[im 1/96]
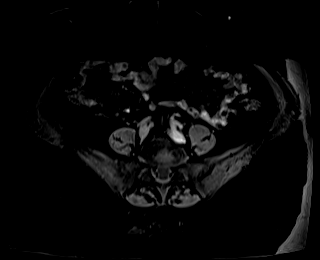
[im 48/96]
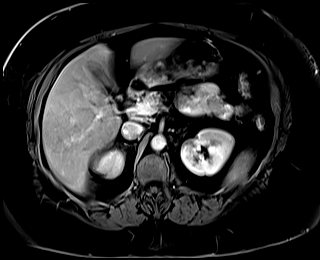
[im 96/96]
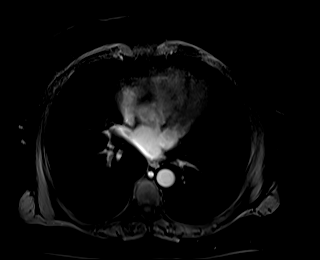

[Series 18: T1 dynamic · axial · 3.0mm · 1.25mm/px · z∈[-88,+197]mm · 3 of 96 slices shown (3 of 10)]
[im 1/96]
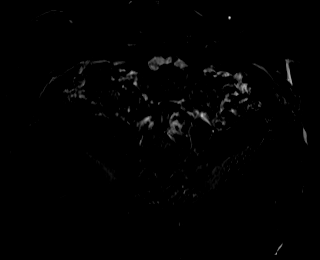
[im 48/96]
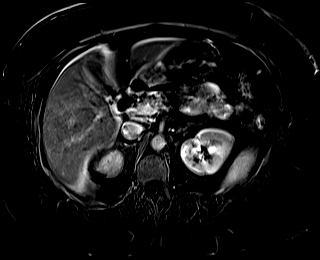
[im 96/96]
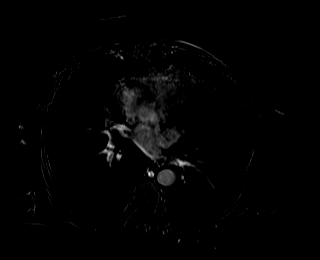

[Series 21: T1 dynamic · axial · 3.0mm · 1.25mm/px · z∈[-88,+197]mm · 3 of 96 slices shown (4 of 10)]
[im 1/96]
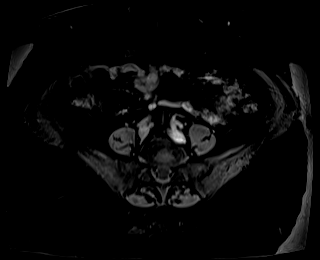
[im 48/96]
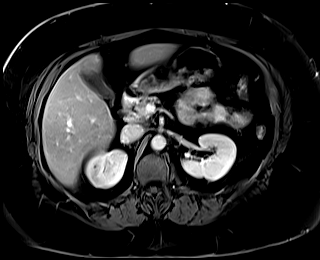
[im 96/96]
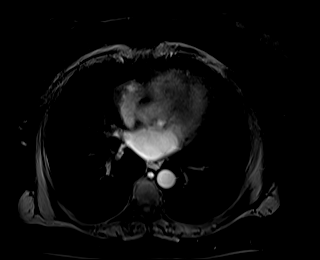

[Series 22: T1 dynamic · axial · 3.0mm · 1.25mm/px · z∈[-88,+197]mm · 3 of 96 slices shown (5 of 10)]
[im 1/96]
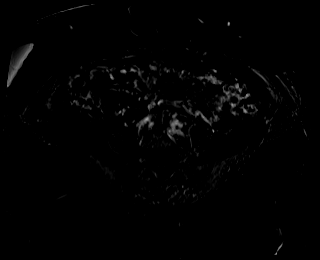
[im 48/96]
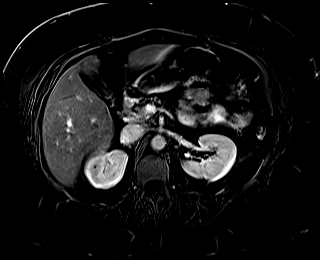
[im 96/96]
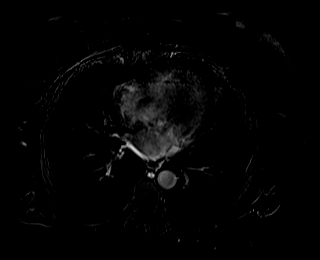

[Series 25: T1 dynamic · axial · 3.0mm · 1.25mm/px · z∈[-88,+197]mm · 3 of 96 slices shown (6 of 10)]
[im 1/96]
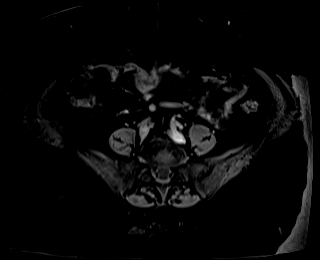
[im 48/96]
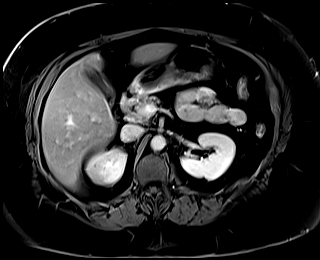
[im 96/96]
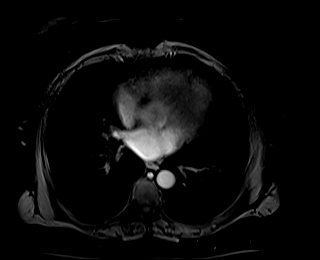

[Series 26: T1 dynamic · axial · 3.0mm · 1.25mm/px · z∈[-88,+197]mm · 3 of 96 slices shown (7 of 10)]
[im 1/96]
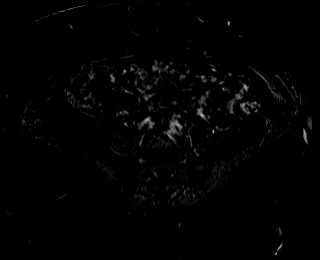
[im 48/96]
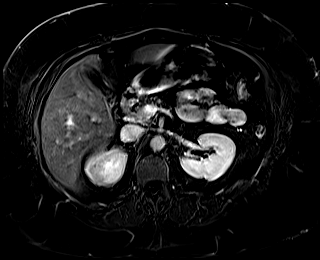
[im 96/96]
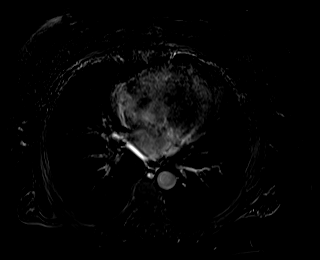

[Series 28: T1 dynamic · coronal · 5.0mm · 1.41mm/px · 2 of 56 slices shown (8 of 10)]
[im 1/56]
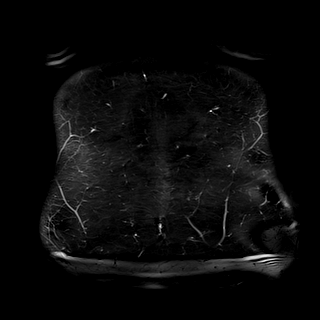
[im 56/56]
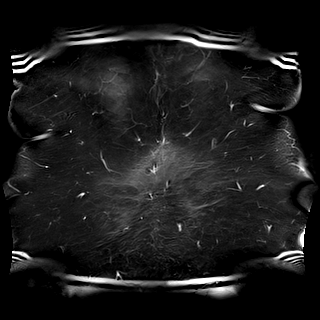

[Series 29: T2 · axial · 6.0mm · 1.56mm/px · z∈[-122,+194]mm · 2 of 45 slices shown (2 of 2)]
[im 1/45]
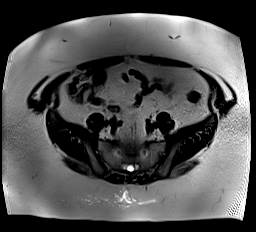
[im 45/45]
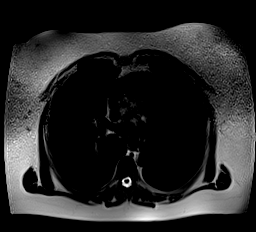

[Series 32: T1 dynamic · axial · 3.0mm · 1.25mm/px · z∈[-88,+197]mm · 3 of 96 slices shown (9 of 10)]
[im 1/96]
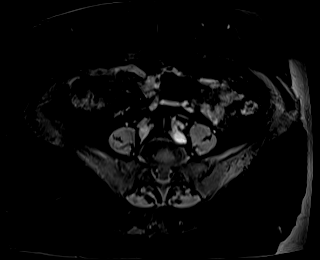
[im 48/96]
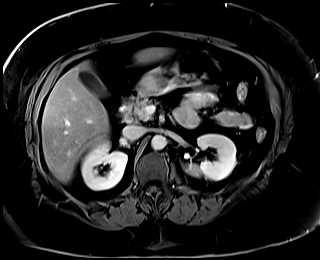
[im 96/96]
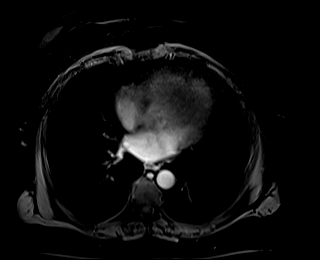

[Series 33: T1 dynamic · axial · 3.0mm · 1.25mm/px · z∈[-88,+197]mm · 3 of 96 slices shown (10 of 10)]
[im 1/96]
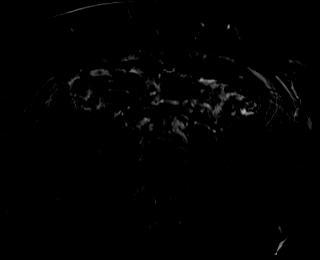
[im 48/96]
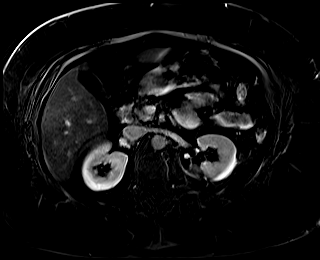
[im 96/96]
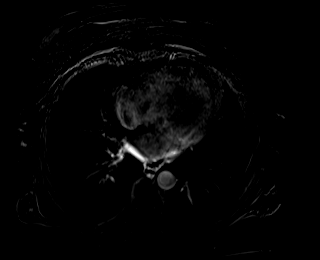

[45 of 48 positions shown; findings below may reference images not displayed]

FINDINGS: Lower chest: No acute findings.

Hepatobiliary: No hepatic masses identified. Moderate diffuse
hepatic steatosis noted. Gallbladder is unremarkable. No evidence of
biliary ductal dilatation.

Pancreas: No pancreatic mass visualized on any sequences including
diffusion imaging. No evidence of pancreatic ductal dilatation or
pancreatitis.

Spleen:  Within normal limits in size and appearance.

Adrenals/Urinary Tract: No masses identified. No evidence of
hydronephrosis.

Stomach/Bowel: Visualized portion unremarkable.

Vascular/Lymphatic: No pathologically enlarged lymph nodes
identified. No abdominal aortic aneurysm.

Other:  None.

Musculoskeletal:  No suspicious bone lesions identified.
IMPRESSION: No evidence of pancreatic mass. No evidence of abdominal metastatic
disease

Moderate hepatic steatosis.

## 2020-12-23 IMAGING — MR MR 3D RECON AT SCANNER
18 of 20 series · 45 of 48 positions shown · IV contrast (gadavist)
Comparison: PET-CT on [DATE] and AP CT on [DATE]

CLINICAL DATA: Rectal neuroendocrine tumor. Pancreatic lesion
recent DOTATATE PET-CT scan.

EXAM:
MRI ABDOMEN WITHOUT AND WITH CONTRAST
TECHNIQUE: Multiplanar multisequence MR imaging of the abdomen was performed
both before and after the administration of intravenous contrast.
CONTRAST:  10mL GADAVIST GADOBUTROL 1 MMOL/ML IV SOLN

[Series 2: DWI · axial · 6.0mm · 1.49mm/px · z∈[-74,+193]mm · 4 of 76 slices shown (1 of 2)]
[im 1/76]
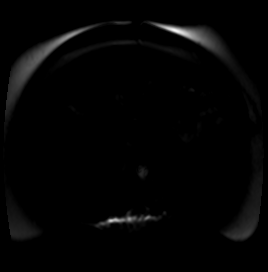
[im 26/76]
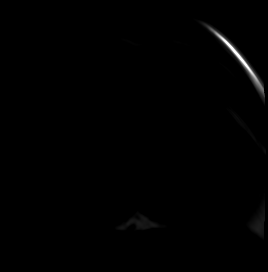
[im 51/76]
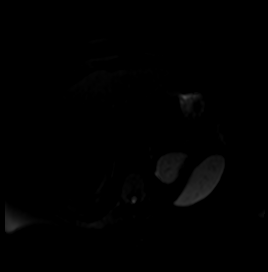
[im 76/76]
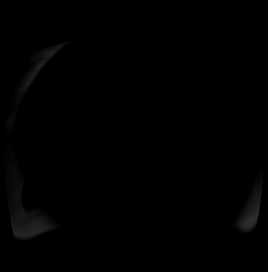

[Series 3: DWI · axial · 6.0mm · 1.49mm/px · 1 of 38 slices shown (2 of 2)]
[im 1/38]
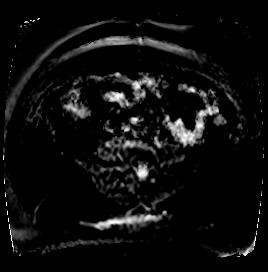

[Series 4: T2 fat-sat · axial · 6.0mm · 1.25mm/px · 1 of 39 slices shown]
[im 1/39]
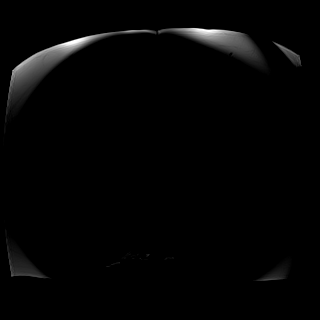

[Series 7: T2 · coronal · 7.0mm · 1.56mm/px · 1 of 36 slices shown (1 of 2)]
[im 1/36]
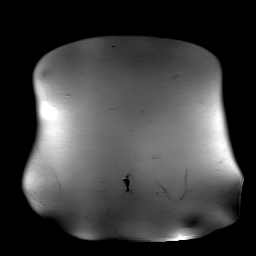

[Series 8: T1 · axial · 3.1mm · 1.25mm/px · z∈[-77,+193]mm · 3 of 88 slices shown (1 of 2)]
[im 1/88]
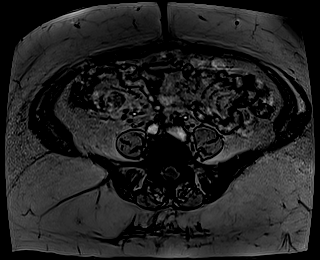
[im 44/88]
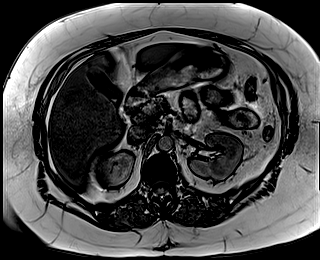
[im 88/88]
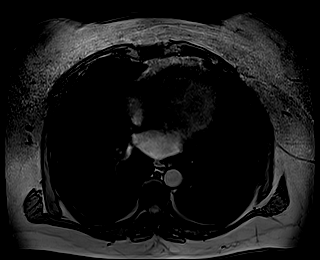

[Series 9: T1 · axial · 3.1mm · 1.25mm/px · z∈[-77,+193]mm · 3 of 88 slices shown (2 of 2)]
[im 1/88]
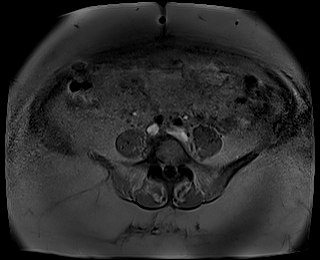
[im 44/88]
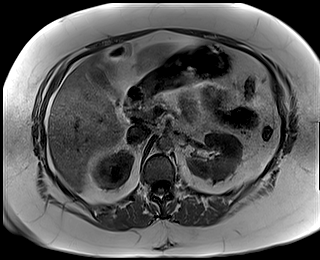
[im 88/88]
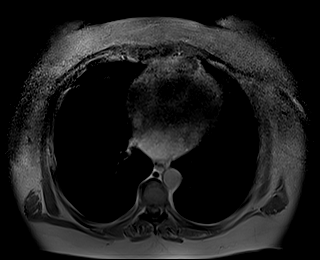

[Series 10: bSSFP · axial · 7.0mm · 1.25mm/px · 1 of 33 slices shown]
[im 1/33]
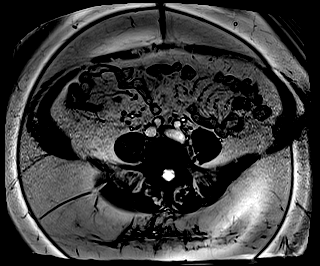

[Series 13: T1 dynamic · axial · 3.0mm · 1.25mm/px · z∈[-88,+197]mm · 3 of 96 slices shown (1 of 10)]
[im 1/96]
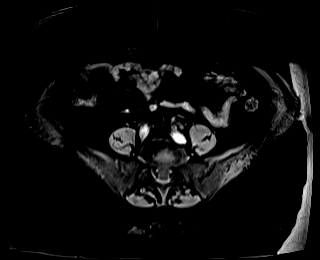
[im 48/96]
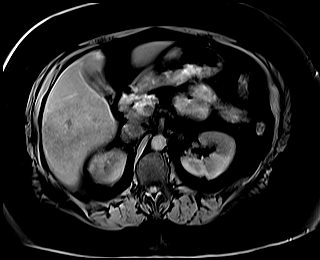
[im 96/96]
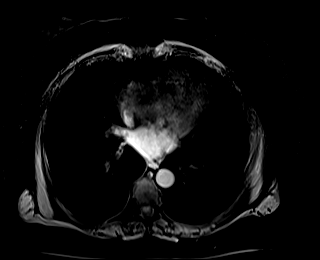

[Series 17: T1 dynamic · axial · 3.0mm · 1.25mm/px · z∈[-88,+197]mm · 3 of 96 slices shown (2 of 10)]
[im 1/96]
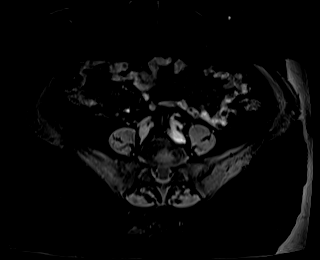
[im 48/96]
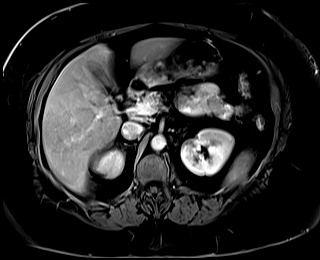
[im 96/96]
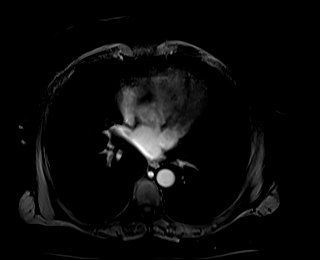

[Series 18: T1 dynamic · axial · 3.0mm · 1.25mm/px · z∈[-88,+197]mm · 3 of 96 slices shown (3 of 10)]
[im 1/96]
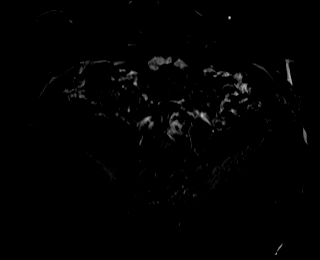
[im 48/96]
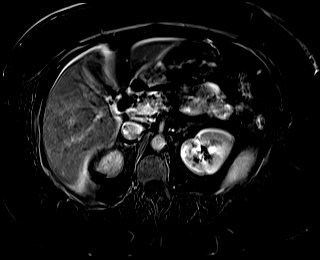
[im 96/96]
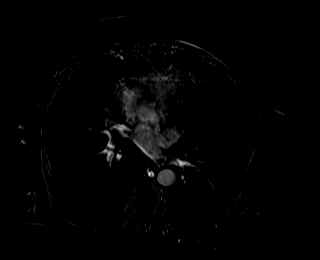

[Series 21: T1 dynamic · axial · 3.0mm · 1.25mm/px · z∈[-88,+197]mm · 3 of 96 slices shown (4 of 10)]
[im 1/96]
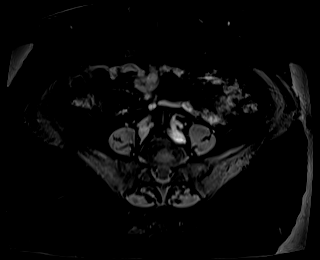
[im 48/96]
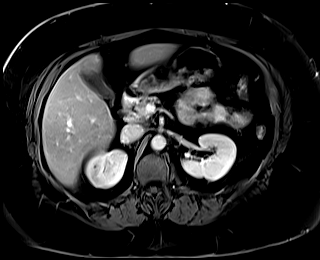
[im 96/96]
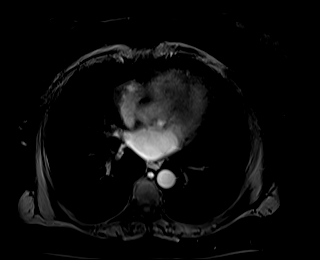

[Series 22: T1 dynamic · axial · 3.0mm · 1.25mm/px · z∈[-88,+197]mm · 3 of 96 slices shown (5 of 10)]
[im 1/96]
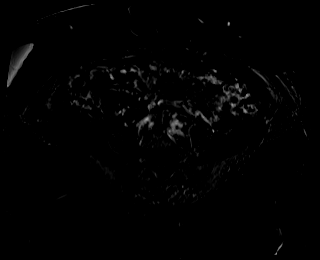
[im 48/96]
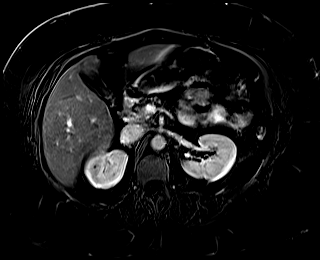
[im 96/96]
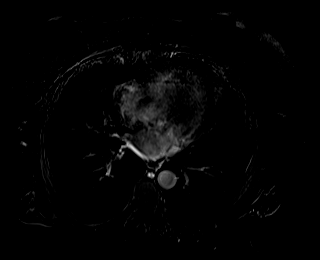

[Series 25: T1 dynamic · axial · 3.0mm · 1.25mm/px · z∈[-88,+197]mm · 3 of 96 slices shown (6 of 10)]
[im 1/96]
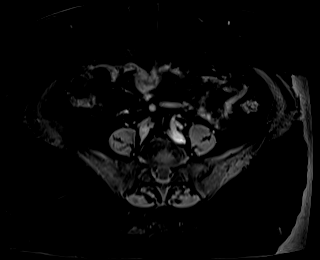
[im 48/96]
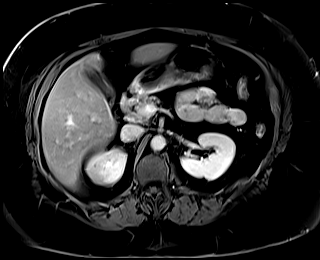
[im 96/96]
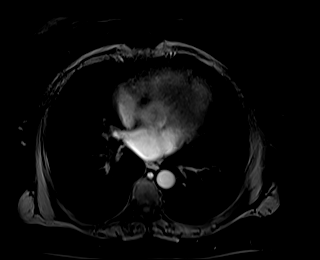

[Series 26: T1 dynamic · axial · 3.0mm · 1.25mm/px · z∈[-88,+197]mm · 3 of 96 slices shown (7 of 10)]
[im 1/96]
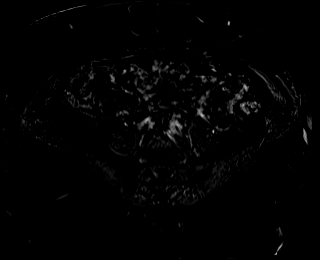
[im 48/96]
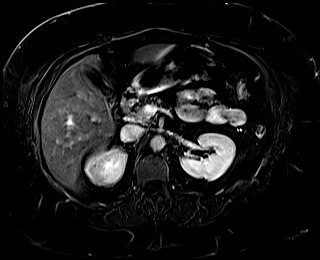
[im 96/96]
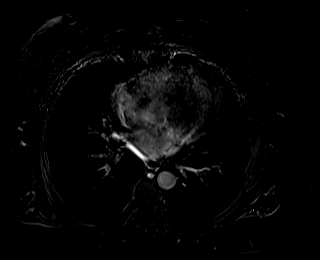

[Series 28: T1 dynamic · coronal · 5.0mm · 1.41mm/px · 2 of 56 slices shown (8 of 10)]
[im 1/56]
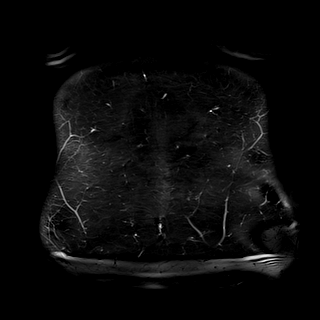
[im 56/56]
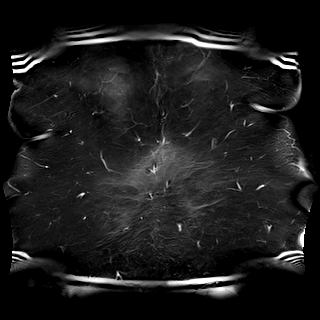

[Series 29: T2 · axial · 6.0mm · 1.56mm/px · z∈[-122,+194]mm · 2 of 45 slices shown (2 of 2)]
[im 1/45]
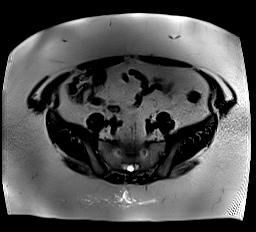
[im 45/45]
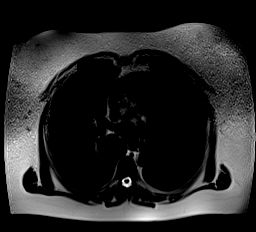

[Series 32: T1 dynamic · axial · 3.0mm · 1.25mm/px · z∈[-88,+197]mm · 3 of 96 slices shown (9 of 10)]
[im 1/96]
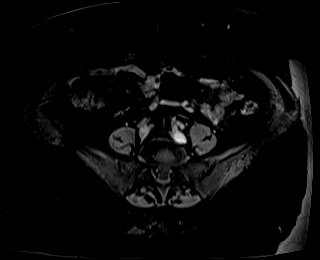
[im 48/96]
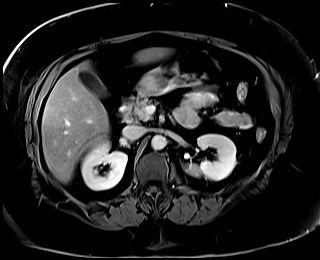
[im 96/96]
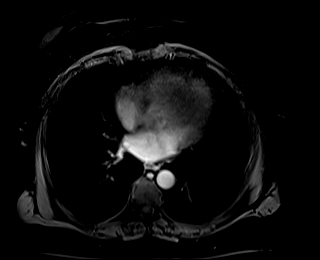

[Series 33: T1 dynamic · axial · 3.0mm · 1.25mm/px · z∈[-88,+197]mm · 3 of 96 slices shown (10 of 10)]
[im 1/96]
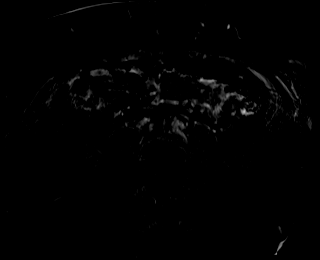
[im 48/96]
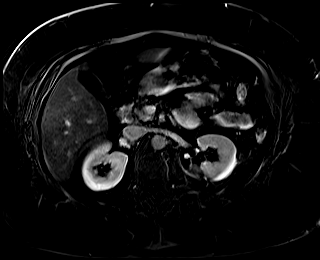
[im 96/96]
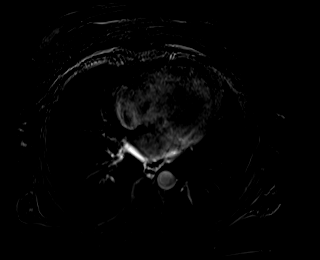

[45 of 48 positions shown; findings below may reference images not displayed]

FINDINGS: Lower chest: No acute findings.

Hepatobiliary: No hepatic masses identified. Moderate diffuse
hepatic steatosis noted. Gallbladder is unremarkable. No evidence of
biliary ductal dilatation.

Pancreas: No pancreatic mass visualized on any sequences including
diffusion imaging. No evidence of pancreatic ductal dilatation or
pancreatitis.

Spleen:  Within normal limits in size and appearance.

Adrenals/Urinary Tract: No masses identified. No evidence of
hydronephrosis.

Stomach/Bowel: Visualized portion unremarkable.

Vascular/Lymphatic: No pathologically enlarged lymph nodes
identified. No abdominal aortic aneurysm.

Other:  None.

Musculoskeletal:  No suspicious bone lesions identified.
IMPRESSION: No evidence of pancreatic mass. No evidence of abdominal metastatic
disease

Moderate hepatic steatosis.

## 2020-12-23 MED ORDER — GADOBUTROL 1 MMOL/ML IV SOLN
10.0000 mL | Freq: Once | INTRAVENOUS | Status: AC | PRN
  Administered 2020-12-23: 10 mL via INTRAVENOUS

## 2020-12-31 ENCOUNTER — Encounter (HOSPITAL_COMMUNITY): Payer: Self-pay | Admitting: Gastroenterology

## 2020-12-31 ENCOUNTER — Other Ambulatory Visit: Payer: Self-pay

## 2021-01-02 ENCOUNTER — Other Ambulatory Visit (HOSPITAL_COMMUNITY)
Admission: RE | Admit: 2021-01-02 | Discharge: 2021-01-02 | Disposition: A | Discharge status: Home / Self Care | Payer: PRIVATE HEALTH INSURANCE | Source: Ambulatory Visit | Attending: Gastroenterology | Admitting: Gastroenterology

## 2021-01-02 DIAGNOSIS — Z20822 Contact with and (suspected) exposure to covid-19: Secondary | ICD-10-CM | POA: Insufficient documentation

## 2021-01-02 DIAGNOSIS — Z01812 Encounter for preprocedural laboratory examination: Secondary | ICD-10-CM | POA: Insufficient documentation

## 2021-01-02 LAB — SARS CORONAVIRUS 2 (TAT 6-24 HRS): SARS Coronavirus 2: NEGATIVE

## 2021-01-06 ENCOUNTER — Ambulatory Visit (HOSPITAL_COMMUNITY): Payer: PRIVATE HEALTH INSURANCE | Admitting: Anesthesiology

## 2021-01-06 ENCOUNTER — Other Ambulatory Visit: Payer: Self-pay

## 2021-01-06 ENCOUNTER — Other Ambulatory Visit: Payer: Self-pay | Admitting: Gastroenterology

## 2021-01-06 ENCOUNTER — Encounter (HOSPITAL_COMMUNITY)
Admission: RE | Disposition: A | Discharge status: Home / Self Care | Payer: Self-pay | Source: Home / Self Care | Attending: Gastroenterology

## 2021-01-06 ENCOUNTER — Ambulatory Visit (HOSPITAL_COMMUNITY)
Admission: RE | Admit: 2021-01-06 | Discharge: 2021-01-06 | Disposition: A | Discharge status: Home / Self Care | Payer: PRIVATE HEALTH INSURANCE | Attending: Gastroenterology | Admitting: Gastroenterology

## 2021-01-06 ENCOUNTER — Encounter (HOSPITAL_COMMUNITY): Payer: Self-pay | Admitting: Gastroenterology

## 2021-01-06 DIAGNOSIS — K2289 Other specified disease of esophagus: Secondary | ICD-10-CM

## 2021-01-06 DIAGNOSIS — K8689 Other specified diseases of pancreas: Secondary | ICD-10-CM | POA: Diagnosis not present

## 2021-01-06 DIAGNOSIS — R933 Abnormal findings on diagnostic imaging of other parts of digestive tract: Secondary | ICD-10-CM | POA: Insufficient documentation

## 2021-01-06 DIAGNOSIS — Z888 Allergy status to other drugs, medicaments and biological substances status: Secondary | ICD-10-CM | POA: Insufficient documentation

## 2021-01-06 DIAGNOSIS — K2950 Unspecified chronic gastritis without bleeding: Secondary | ICD-10-CM | POA: Insufficient documentation

## 2021-01-06 DIAGNOSIS — Z885 Allergy status to narcotic agent status: Secondary | ICD-10-CM | POA: Diagnosis not present

## 2021-01-06 DIAGNOSIS — B9681 Helicobacter pylori [H. pylori] as the cause of diseases classified elsewhere: Secondary | ICD-10-CM | POA: Insufficient documentation

## 2021-01-06 DIAGNOSIS — R9389 Abnormal findings on diagnostic imaging of other specified body structures: Secondary | ICD-10-CM

## 2021-01-06 DIAGNOSIS — F17210 Nicotine dependence, cigarettes, uncomplicated: Secondary | ICD-10-CM | POA: Insufficient documentation

## 2021-01-06 DIAGNOSIS — K869 Disease of pancreas, unspecified: Secondary | ICD-10-CM

## 2021-01-06 DIAGNOSIS — K449 Diaphragmatic hernia without obstruction or gangrene: Secondary | ICD-10-CM | POA: Insufficient documentation

## 2021-01-06 DIAGNOSIS — K861 Other chronic pancreatitis: Secondary | ICD-10-CM

## 2021-01-06 DIAGNOSIS — K3189 Other diseases of stomach and duodenum: Secondary | ICD-10-CM

## 2021-01-06 HISTORY — PX: BIOPSY: SHX5522

## 2021-01-06 HISTORY — PX: FINE NEEDLE ASPIRATION: SHX5430

## 2021-01-06 HISTORY — PX: ESOPHAGOGASTRODUODENOSCOPY: SHX5428

## 2021-01-06 HISTORY — PX: EUS: SHX5427

## 2021-01-06 SURGERY — UPPER ENDOSCOPIC ULTRASOUND (EUS) RADIAL
Anesthesia: Monitor Anesthesia Care

## 2021-01-06 MED ORDER — FENTANYL CITRATE (PF) 100 MCG/2ML IJ SOLN
INTRAMUSCULAR | Status: DC | PRN
  Administered 2021-01-06 (×2): 50 ug via INTRAVENOUS

## 2021-01-06 MED ORDER — LACTATED RINGERS IV SOLN: Freq: Once | INTRAVENOUS | Status: AC

## 2021-01-06 MED ORDER — SODIUM CHLORIDE 0.9 % IV SOLN: INTRAVENOUS | Status: DC

## 2021-01-06 MED ORDER — LACTATED RINGERS IV SOLN: INTRAVENOUS | Status: DC | PRN

## 2021-01-06 MED ORDER — PROPOFOL 500 MG/50ML IV EMUL
INTRAVENOUS | Status: DC | PRN
  Administered 2021-01-06: 200 ug/kg/min via INTRAVENOUS
  Administered 2021-01-06: 100 mg via INTRAVENOUS
  Administered 2021-01-06: 150 ug/kg/min via INTRAVENOUS
  Administered 2021-01-06: 100 mg via INTRAVENOUS
  Administered 2021-01-06: 50 mg via INTRAVENOUS

## 2021-01-06 NOTE — Discharge Instructions (Signed)
YOU HAD AN ENDOSCOPIC PROCEDURE TODAY: Refer to the procedure report and other information in the discharge instructions given to you for any specific questions about what was found during the examination. If this information does not answer your questions, please call [facility] office at [phone] to clarify.  ° °YOU SHOULD EXPECT: Some feelings of bloating in the abdomen. Passage of more gas than usual. Walking can help get rid of the air that was put into your GI tract during the procedure and reduce the bloating. If you had a lower endoscopy (such as a colonoscopy or flexible sigmoidoscopy) you may notice spotting of blood in your stool or on the toilet paper. Some abdominal soreness may be present for a day or two, also. ° °DIET: Your first meal following the procedure should be a light meal and then it is ok to progress to your normal diet. A half-sandwich or bowl of soup is an example of a good first meal. Heavy or fried foods are harder to digest and may make you feel nauseous or bloated. Drink plenty of fluids but you should avoid alcoholic beverages for 24 hours. If you had a esophageal dilation, please see attached instructions for diet.   ° °ACTIVITY: Your care partner should take you home directly after the procedure. You should plan to take it easy, moving slowly for the rest of the day. You can resume normal activity the day after the procedure however YOU SHOULD NOT DRIVE, use power tools, machinery or perform tasks that involve climbing or major physical exertion for 24 hours (because of the sedation medicines used during the test).  ° °SYMPTOMS TO REPORT IMMEDIATELY: °A gastroenterologist can be reached at any hour. Please call [phone]  for any of the following symptoms:  °Following lower endoscopy (colonoscopy, flexible sigmoidoscopy) °Excessive amounts of blood in the stool  °Significant tenderness, worsening of abdominal pains  °Swelling of the abdomen that is new, acute  °Fever of 100° or  higher  °Following upper endoscopy (EGD, EUS, ERCP, esophageal dilation) °Vomiting of blood or coffee ground material  °New, significant abdominal pain  °New, significant chest pain or pain under the shoulder blades  °Painful or persistently difficult swallowing  °New shortness of breath  °Black, tarry-looking or red, bloody stools ° °FOLLOW UP:  °If any biopsies were taken you will be contacted by phone or by letter within the next 1-3 weeks. Call [phone]  if you have not heard about the biopsies in 3 weeks.  °Please also call with any specific questions about appointments or follow up tests. ° °

## 2021-01-06 NOTE — H&P (Signed)
GASTROENTEROLOGY PROCEDURE H&P NOTE   Primary Care Physician: Dorothyann Peng, NP  HPI: Holly Horn is a 50 y.o. female who presents for EGD/EUS for abnormal NETSPOT with possible pancreatic lesion/lightup though recent MRI/MRCP negative for pancreatic lesion, though Chromogranin A still remains elevated.    Past Medical History:  Diagnosis Date  . Ectopic pregnancy    x 3  . Essential hypertension   . GERD (gastroesophageal reflux disease)   . Hyperlipidemia    Past Surgical History:  Procedure Laterality Date  . COLONOSCOPY  06/2020  . ECTOPIC PREGNANCY SURGERY    . ENDOSCOPIC MUCOSAL RESECTION  08/14/2020   Procedure: ENDOSCOPIC MUCOSAL RESECTION;  Surgeon: Rush Landmark Telford Nab., MD;  Location: [facility];  Service: Gastroenterology;;  . EUS N/A 08/14/2020   Procedure: LOWER ENDOSCOPIC ULTRASOUND (EUS);  Surgeon: Irving Copas., MD;  Location: [facility];  Service: Gastroenterology;  Laterality: N/A;  . FLEXIBLE SIGMOIDOSCOPY N/A 08/14/2020   Procedure: FLEXIBLE SIGMOIDOSCOPY;  Surgeon: Rush Landmark Telford Nab., MD;  Location: [facility];  Service: Gastroenterology;  Laterality: N/A;  . HEMOSTASIS CLIP PLACEMENT  08/14/2020   Procedure: HEMOSTASIS CLIP PLACEMENT;  Surgeon: Irving Copas., MD;  Location: [facility];  Service: Gastroenterology;;  . [place] INJECTION  08/14/2020   Procedure: SUBMUCOSAL LIFTING INJECTION;  Surgeon: Irving Copas., MD;  Location: [facility] END[facility];  Service: Gastroenterology;;  . [facility] Surgery  2008 & 2010   Current Facility-Administered Medications  Medication Dose Route Frequency Provider Last Rate Last Admin  . 0.9 %  sodium chloride infusion   Intravenous Continuous Mansouraty, Telford Nab., MD       Allergies  Allergen Reactions  . Morphine Sulfate Shortness Of Breath and Nausea And Vomiting  . Lisinopril Cough  . Progesterone Nausea Only   Family History  Problem Relation Age of Onset  .  Hypertension Mother   . Hypertension Brother   . Osteoarthritis Father   . Breast cancer Other   . Cancer Maternal Grandmother        unknown  . Cancer Maternal Grandfather        unknown  . Cancer Paternal Grandmother        unknown  . Cancer Paternal Grandfather        unknowon  . Colon cancer Neg Hx   . Colon polyps Neg Hx   . Esophageal cancer Neg Hx   . Rectal cancer Neg Hx   . Stomach cancer Neg Hx   . Inflammatory bowel disease Neg Hx   . Liver disease Neg Hx   . Pancreatic cancer Neg Hx    Social History   Socioeconomic History  . Marital status: Significant Other    Spouse name: Not on file  . Number of children: Not on file  . Years of education: Not on file  . Highest education level: Not on file  Occupational History  . Not on file  Tobacco Use  . Smoking status: Current Some Day Smoker    Types: Cigarettes  . Smokeless tobacco: Never Used  . Tobacco comment: socially  Vaping Use  . Vaping Use: Never used  Substance and Sexual Activity  . Alcohol use: Yes    Alcohol/week: 12.0 standard drinks    Types: 12 Standard drinks or equivalent per week  . Drug use: No  . Sexual activity: Not on file  Other Topics Concern  . Not on file  Social History Narrative  . Not on file   Social Determinants of Health  Financial Resource Strain: Not on file  Food Insecurity: Not on file  Transportation Needs: Not on file  Physical Activity: Not on file  Stress: Not on file  Social Connections: Not on file  Intimate Partner Violence: Not on file    Physical Exam: Vital signs in last 24 hours: Temp:  [98.2 F (36.8 C)] 98.2 F (36.8 C) (05/31 1222) Pulse Rate:  [79] 79 (05/31 1222) Resp:  [18] 18 (05/31 1222) BP: (137)/(96) 137/96 (05/31 1222) SpO2:  [98 %] 98 % (05/31 1222) Weight:  [125.6 kg] 125.6 kg (05/31 1222)   GEN: NAD EYE: Sclerae anicteric ENT: MMM CV: Non-tachycardic GI: Soft, NT/ND NEURO:  Alert & Oriented x 3  Lab Results: No results  for input(s): WBC, HGB, HCT, PLT in the last 72 hours. BMET No results for input(s): NA, K, CL, CO2, GLUCOSE, BUN, CREATININE, CALCIUM in the last 72 hours. LFT No results for input(s): PROT, ALBUMIN, AST, ALT, ALKPHOS, BILITOT, BILIDIR, IBILI in the last 72 hours. PT/INR No results for input(s): LABPROT, INR in the last 72 hours.   Impression / Plan: This is a 50 y.o.female who presents for EGD/EUS for abnormal NETSPOT with possible pancreatic lesion/lightup though recent MRI/MRCP negative for pancreatic lesion, though Chromogranin A still remains elevated.    The risks of an EUS including intestinal perforation, bleeding, infection, aspiration, and medication effects were discussed as was the possibility it may not give a definitive diagnosis if a biopsy is performed.  When a biopsy of the pancreas is done as part of the EUS, there is an additional risk of pancreatitis at the rate of about 1-2%.  It was explained that procedure related pancreatitis is typically mild, although it can be severe and even life threatening, which is why we do not perform random pancreatic biopsies and only biopsy a lesion/area we feel is concerning enough to warrant the risk.  The risks and benefits of endoscopic evaluation were discussed with the patient; these include but are not limited to the risk of perforation, infection, bleeding, missed lesions, lack of diagnosis, severe illness requiring hospitalization, as well as anesthesia and sedation related illnesses.  The patient is agreeable to proceed.    Justice Britain, MD [facility] Office # [phone]

## 2021-01-06 NOTE — Transfer of Care (Signed)
Immediate Anesthesia Transfer of Care Note  Patient: Holly Horn  Procedure(s) Performed: Procedure(s): UPPER ENDOSCOPIC ULTRASOUND (EUS) RADIAL (N/A) BIOPSY ESOPHAGOGASTRODUODENOSCOPY (EGD) (N/A) FINE NEEDLE ASPIRATION (FNA) LINEAR  Patient Location: PACU and Endoscopy Unit  Anesthesia Type:MAC  Level of Consciousness: awake, alert  and oriented  Airway & Oxygen Therapy: Patient Spontanous Breathing and Patient connected to nasal cannula oxygen  Post-op Assessment: Report given to RN and Post -op Vital signs reviewed and stable  Post vital signs: Reviewed and stable  Last Vitals:  Vitals:   01/06/21 1222  BP: (!) 137/96  Pulse: 79  Resp: 18  Temp: 36.8 C  SpO2: 75%    Complications: No apparent anesthesia complications

## 2021-01-06 NOTE — Op Note (Addendum)
[facility] Patient Name: Holly Horn Procedure Date: 01/06/2021 MRN: [id number] Attending MD: Justice Britain , MD Date of Birth: 1971-04-08 CSN: [id number] Age: 50 Admit Type: Outpatient Procedure:                Upper EUS Indications:              Abnormal abdominal PET scan, Abnormal lab work,                            Neuroendocrine Tumor Providers:                Justice Britain, MD, Burtis Junes, RN, Ladona Ridgel, Technician, Eliberto Ivory, CRNA Referring MD:             Dorothyann Peng Medicines:                Monitored Anesthesia Care Complications:            No immediate complications. Estimated Blood Loss:     Estimated blood loss was minimal. Procedure:                Pre-Anesthesia Assessment:                           - Prior to the procedure, a History and Physical                            was performed, and patient medications and                            allergies were reviewed. The patient's tolerance of                            previous anesthesia was also reviewed. The risks                            and benefits of the procedure and the sedation                            options and risks were discussed with the patient.                            All questions were answered, and informed consent                            was obtained. Prior Anticoagulants: The patient has                            taken no previous anticoagulant or antiplatelet                            agents except for NSAID medication. ASA Grade  Assessment: II - A patient with mild systemic                            disease. After reviewing the risks and benefits,                            the patient was deemed in satisfactory condition to                            undergo the procedure.                           After obtaining informed consent, the endoscope was                            passed  under direct vision. Throughout the                            procedure, the patient's blood pressure, pulse, and                            oxygen saturations were monitored continuously. The                            [id number]-H190 ([id number]) Olympus gastroscope was                            introduced through the mouth, and advanced to the                            second part of duodenum. The ERCP [id number] was                            introduced through the mouth, and advanced to the                            duodenum for ultrasound examination from the                            stomach and duodenum. The ([id number]) [id number]                            Linear EUS was introduced through the mouth, and                            advanced to the duodenum for ultrasound examination                            from the stomach and duodenum. The upper EUS was                            accomplished without difficulty. The patient  tolerated the procedure. Scope In: Scope Out: Findings:      ENDOSCOPIC FINDING: :      No gross lesions were noted in the entire esophagus. Biopsies were taken       with a cold forceps for histology to rule out EoE/LoE.      The Z-line was irregular and was found 36 cm from the incisors.      A 2 cm hiatal hernia was present.      Striped mildly erythematous mucosa without bleeding was found in the       gastric antrum.      No other gross lesions were noted in the entire examined stomach.       Biopsies were taken with a cold forceps for histology and Helicobacter       pylori testing.      No gross lesions were noted in the duodenal bulb, in the first portion       of the duodenum and in the second portion of the duodenum.      The major papilla was normal.      ENDOSONOGRAPHIC FINDING: :      Pancreatic parenchymal abnormalities were noted in the entire pancreas.       These consisted of diffuse echogenicity, lobularity without  honeycombing       and hyperechoic strands.      The pancreatic duct had a normal endosonographic appearance in the       pancreatic head (0.9 mm), genu of the pancreas (1.2 mm), body of the       pancreas (0.7 mm) and tail of the pancreas (0.9 mm).      There was no sign of significant endosonographic abnormality in the       common bile duct (2.3 mm -> 3.3 mm) and in the common hepatic duct (4.3       mm).      A rounded lesion was identified in the peripancreatic tail region -       query a lymph node (was within 5 mm of the spleen. The lesion was       hypoechoic and measured 3.8 mm by 4.3 mm in maximal cross-sectional       diameter. The endosonographic borders were well-defined. Due to this       region having had positive DOTATATE even though no overt Pancreatic       lesion was noted on MRI or on today's endoscopic ultrasound a fine       needle biopsy was performed. Color Doppler imaging was utilized prior to       needle puncture to confirm a lack of significant vascular structures       within the needle path. Four passes were made with the Acquire 25 gauge       ultrasound biopsy needle using a transgastric approach (carefully, as       the spleen was within 5 mm of the lesion). A visible core of tissue was       obtained. Final cytology results are pending.      Endosonographic imaging of the ampulla showed no extrinsic compression,       intramural (subepithelial) lesion, mass, varices or wall thickening.      Endosonographic imaging in the visualized portion of the liver showed no       mass.      The celiac region was visualized. Impression:  EGD Impression:                           - No gross lesions in esophagus. Biopsied for EoE.                           - Z-line irregular, 36 cm from the incisors.                           - 2 cm hiatal hernia.                           - Erythematous mucosa in the antrum. No other gross                             lesions in the stomach. Biopsied.                           - No gross lesions in the duodenal bulb, in the                            first portion of the duodenum and in the second                            portion of the duodenum.                           - Normal major papilla.                           EUS Impression:                           - Pancreatic parenchymal abnormalities consisting                            of diffuse echogenicity, lobularity and hyperechoic                            strands were noted in the entire pancreas. These do                            not meet criteria for EUS diagnosis of Chronic                            Pancreatitis but are suggestive.                           - The pancreatic duct had a normal endosonographic                            appearance in the pancreatic head, genu of the                            pancreas, body of the pancreas and tail  of the                            pancreas.                           - There was no sign of significant pathology in the                            common bile duct and in the common hepatic duct.                           - A peripancreatic tail lesion vs lymph node was                            identified. This did not have the appearance of                            origination from within the pancreas but was rather                            intimately close to the spleen. Due to the positive                            DOTATATE scan in this region (even though no mass                            noted on MRI or today's EUS in the pancreas) FNB                            attempted today of the small lesion. Cytology                            results are pending. Moderate Sedation:      Not Applicable - Patient had care per Anesthesia. Recommendation:           - The patient will be observed post-procedure,                            until all discharge criteria are met.                            - Discharge patient to home.                           - Patient has a contact number available for                            emergencies. The signs and symptoms of potential                            delayed complications were discussed with the  patient. Return to normal activities tomorrow.                            Written discharge instructions were provided to the                            patient.                           - Low fat diet.                           - Observe patient's clinical course.                           - Await cytology results and await path results.                           - Obtain a Fecal Elastase to evaluate for signs of                            EPI (order will be placed to pick up kit at [facility]).                           - Monitor for signs/symptoms of bleeding,                            perforation, and infection. If issues please call                            our number to get further assistance as needed.                           - Based on final pathology, will discuss case at an                            upcoming Multidisciplinary Conference for further                            management options/surveillance/referral placement.                            It is possible that sample error could occur with                            such a small lesion, that repeat EUS may be                            indicated at some point in future vs repeat                            DOTATATE scan.                           -  The findings and recommendations were discussed                            with the patient.                           - The findings and recommendations were discussed                            with the designated responsible adult. Procedure Code(s):        --- Professional ---                           [phone], Esophagogastroduodenoscopy, flexible,                             transoral; with transendoscopic ultrasound-guided                            intramural or transmural fine needle                            aspiration/biopsy(s), (includes endoscopic                            ultrasound examination limited to the esophagus,                            stomach or duodenum, and adjacent structures) Diagnosis Code(s):        --- Professional ---                           K22.8, Other specified diseases of esophagus                           K44.9, Diaphragmatic hernia without obstruction or                            gangrene                           K31.89, Other diseases of stomach and duodenum                           K86.9, Disease of pancreas, unspecified                           K86.89, Other specified diseases of pancreas                           R79.9, Abnormal finding of blood chemistry,                            unspecified                           R93.5, Abnormal findings on diagnostic imaging of  other abdominal regions, including retroperitoneum CPT copyright 2019 [employer]. All rights reserved. The codes documented in this report are preliminary and upon coder review may  be revised to meet current compliance requirements. Justice Britain, MD 01/06/2021 2:53:38 PM Number of Addenda: 0

## 2021-01-06 NOTE — Anesthesia Postprocedure Evaluation (Signed)
Anesthesia Post Note  Patient: Holly Horn  Procedure(s) Performed: UPPER ENDOSCOPIC ULTRASOUND (EUS) RADIAL (N/A ) BIOPSY ESOPHAGOGASTRODUODENOSCOPY (EGD) (N/A ) FINE NEEDLE ASPIRATION (FNA) LINEAR     Patient location during evaluation: Endoscopy Anesthesia Type: MAC Level of consciousness: awake and alert Pain management: pain level controlled Vital Signs Assessment: post-procedure vital signs reviewed and stable Respiratory status: spontaneous breathing Cardiovascular status: stable Anesthetic complications: no   No complications documented.  Last Vitals:  Vitals:   01/06/21 1510 01/06/21 1511  BP: (!) 145/100 (!) 135/92  Pulse: 77 79  Resp: 14 15  Temp:    SpO2: 100% 99%    Last Pain:  Vitals:   01/06/21 1510  TempSrc:   PainSc: 0-No pain                 Nolon Nations

## 2021-01-06 NOTE — Anesthesia Preprocedure Evaluation (Signed)
Anesthesia Evaluation  Patient identified by MRN, date of birth, ID band Patient awake    Reviewed: Allergy & Precautions, NPO status , Patient's Chart, lab work & pertinent test results  Airway Mallampati: II  TM Distance: >3 FB Neck ROM: Full    Dental no notable dental hx.    Pulmonary asthma , Current Smoker and Patient abstained from smoking.,    Pulmonary exam normal breath sounds clear to auscultation       Cardiovascular hypertension, Pt. on medications Normal cardiovascular exam Rhythm:Regular Rate:Normal     Neuro/Psych  Headaches, PSYCHIATRIC DISORDERS Anxiety Depression    GI/Hepatic GERD  Medicated and Controlled,(+)     substance abuse  alcohol use, Rectal lesions, neuroendocrine tumor    Endo/Other  Morbid obesityBMI 43  Renal/GU negative Renal ROS  negative genitourinary   Musculoskeletal negative musculoskeletal ROS (+)   Abdominal   Peds  Hematology negative hematology ROS (+)   Anesthesia Other Findings   Reproductive/Obstetrics negative OB ROS                             Anesthesia Physical  Anesthesia Plan  ASA: III  Anesthesia Plan: MAC   Post-op Pain Management:    Induction:   PONV Risk Score and Plan: 2 and Ondansetron, Dexamethasone, Treatment may vary due to age or medical condition and Propofol infusion  Airway Management Planned: Natural Airway and Nasal Cannula  Additional Equipment: None  Intra-op Plan:   Post-operative Plan:   Informed Consent: I have reviewed the patients History and Physical, chart, labs and discussed the procedure including the risks, benefits and alternatives for the proposed anesthesia with the patient or authorized representative who has indicated his/her understanding and acceptance.       Plan Discussed with: CRNA  Anesthesia Plan Comments:         Anesthesia Quick Evaluation

## 2021-01-07 ENCOUNTER — Other Ambulatory Visit: Payer: Self-pay

## 2021-01-07 ENCOUNTER — Other Ambulatory Visit: Payer: Self-pay | Admitting: Gastroenterology

## 2021-01-07 ENCOUNTER — Encounter: Payer: Self-pay | Admitting: Gastroenterology

## 2021-01-07 LAB — SURGICAL PATHOLOGY

## 2021-01-07 LAB — CYTOLOGY - NON PAP

## 2021-01-07 MED ORDER — PYLERA 140-125-125 MG PO CAPS: 3.0000 | ORAL_CAPSULE | Freq: Three times a day (TID) | ORAL | 0 refills | Status: DC

## 2021-01-08 ENCOUNTER — Other Ambulatory Visit: Payer: Self-pay

## 2021-01-08 ENCOUNTER — Encounter (HOSPITAL_COMMUNITY): Payer: Self-pay | Admitting: Gastroenterology

## 2021-01-08 MED ORDER — AMOXICILLIN 500 MG PO TABS: 1000.0000 mg | ORAL_TABLET | Freq: Two times a day (BID) | ORAL | 0 refills | Status: AC

## 2021-01-08 MED ORDER — METRONIDAZOLE 500 MG PO TABS: 500.0000 mg | ORAL_TABLET | Freq: Two times a day (BID) | ORAL | 0 refills | Status: AC

## 2021-01-08 MED ORDER — CLARITHROMYCIN 500 MG PO TABS: 500.0000 mg | ORAL_TABLET | Freq: Two times a day (BID) | ORAL | 0 refills | Status: AC

## 2021-01-30 ENCOUNTER — Other Ambulatory Visit: Payer: Self-pay | Admitting: Adult Health

## 2021-01-30 DIAGNOSIS — F419 Anxiety disorder, unspecified: Secondary | ICD-10-CM

## 2021-01-30 DIAGNOSIS — F32A Depression, unspecified: Secondary | ICD-10-CM

## 2021-02-19 ENCOUNTER — Encounter: Payer: Self-pay | Admitting: Adult Health

## 2021-02-20 NOTE — Telephone Encounter (Signed)
Monona for pt to call and reschedule appt. For 3 month follow-up? Please advise

## 2021-03-03 ENCOUNTER — Ambulatory Visit: Payer: PRIVATE HEALTH INSURANCE | Admitting: Adult Health

## 2021-03-31 ENCOUNTER — Telehealth: Payer: Self-pay | Admitting: Adult Health

## 2021-03-31 NOTE — Telephone Encounter (Signed)
Please look into the following billing issue:  1st step: verify that the patient has contacted billing at [phone]  No, Ask the patient to contact billing  Yes, what did billing tell the patient: To call our office  2nd step: Is it  a bill for services performed in our office? Is it for a provider visit or lab visit? Provider visit  Date of Service: unsure Amount: about a hundred and two dollars Billing issue: Patient told by billing that code was correct    Patient was advised that this process can take up to a week or more. Patient also was advised that they may receive additional bills while this is being looked into and they are to hold onto all statements until resolved.    Please send this completed information to [employer] office supervisor or Practice Admin

## 2021-04-08 ENCOUNTER — Encounter: Payer: Self-pay | Admitting: Adult Health

## 2021-04-08 NOTE — Telephone Encounter (Signed)
Please advise 

## 2021-04-09 ENCOUNTER — Encounter: Payer: Self-pay | Admitting: Adult Health

## 2021-04-15 ENCOUNTER — Ambulatory Visit: Payer: PRIVATE HEALTH INSURANCE | Admitting: Adult Health

## 2021-04-16 ENCOUNTER — Other Ambulatory Visit: Payer: Self-pay | Admitting: Adult Health

## 2021-04-16 DIAGNOSIS — F32A Depression, unspecified: Secondary | ICD-10-CM

## 2021-04-16 DIAGNOSIS — F419 Anxiety disorder, unspecified: Secondary | ICD-10-CM

## 2021-06-01 ENCOUNTER — Other Ambulatory Visit: Payer: Self-pay | Admitting: Adult Health

## 2021-06-01 DIAGNOSIS — I10 Essential (primary) hypertension: Secondary | ICD-10-CM

## 2021-07-14 ENCOUNTER — Other Ambulatory Visit: Payer: Self-pay | Admitting: Adult Health

## 2021-07-14 DIAGNOSIS — K219 Gastro-esophageal reflux disease without esophagitis: Secondary | ICD-10-CM

## 2021-07-29 ENCOUNTER — Ambulatory Visit (INDEPENDENT_AMBULATORY_CARE_PROVIDER_SITE_OTHER): Payer: No Typology Code available for payment source | Admitting: Adult Health

## 2021-07-29 ENCOUNTER — Other Ambulatory Visit: Payer: Self-pay | Admitting: Adult Health

## 2021-07-29 ENCOUNTER — Encounter: Payer: Self-pay | Admitting: Adult Health

## 2021-07-29 VITALS — BP 122/88 | HR 72 | Temp 98.1°F | Ht 67.0 in | Wt 292.0 lb

## 2021-07-29 DIAGNOSIS — F32A Depression, unspecified: Secondary | ICD-10-CM

## 2021-07-29 DIAGNOSIS — Z23 Encounter for immunization: Secondary | ICD-10-CM

## 2021-07-29 DIAGNOSIS — K219 Gastro-esophageal reflux disease without esophagitis: Secondary | ICD-10-CM

## 2021-07-29 DIAGNOSIS — Z6841 Body Mass Index (BMI) 40.0 and over, adult: Secondary | ICD-10-CM

## 2021-07-29 DIAGNOSIS — Z1159 Encounter for screening for other viral diseases: Secondary | ICD-10-CM

## 2021-07-29 DIAGNOSIS — F419 Anxiety disorder, unspecified: Secondary | ICD-10-CM

## 2021-07-29 DIAGNOSIS — Z0000 Encounter for general adult medical examination without abnormal findings: Secondary | ICD-10-CM | POA: Diagnosis not present

## 2021-07-29 DIAGNOSIS — F5101 Primary insomnia: Secondary | ICD-10-CM

## 2021-07-29 DIAGNOSIS — E782 Mixed hyperlipidemia: Secondary | ICD-10-CM

## 2021-07-29 DIAGNOSIS — I10 Essential (primary) hypertension: Secondary | ICD-10-CM

## 2021-07-29 LAB — CBC WITH DIFFERENTIAL/PLATELET
Basophils Absolute: 0 10*3/uL (ref 0.0–0.1)
Basophils Relative: 0.6 % (ref 0.0–3.0)
Eosinophils Absolute: 0.1 10*3/uL (ref 0.0–0.7)
Eosinophils Relative: 1.3 % (ref 0.0–5.0)
HCT: 36.9 % (ref 36.0–46.0)
Hemoglobin: 12 g/dL (ref 12.0–15.0)
Lymphocytes Relative: 32.5 % (ref 12.0–46.0)
Lymphs Abs: 2.3 10*3/uL (ref 0.7–4.0)
MCHC: 32.4 g/dL (ref 30.0–36.0)
MCV: 90.1 fl (ref 78.0–100.0)
Monocytes Absolute: 0.4 10*3/uL (ref 0.1–1.0)
Monocytes Relative: 6 % (ref 3.0–12.0)
Neutro Abs: 4.3 10*3/uL (ref 1.4–7.7)
Neutrophils Relative %: 59.6 % (ref 43.0–77.0)
Platelets: 292 10*3/uL (ref 150.0–400.0)
RBC: 4.1 Mil/uL (ref 3.87–5.11)
RDW: 13.8 % (ref 11.5–15.5)
WBC: 7.2 10*3/uL (ref 4.0–10.5)

## 2021-07-29 LAB — LIPID PANEL
Cholesterol: 138 mg/dL (ref 0–200)
HDL: 65.6 mg/dL (ref >39.00)
LDL Cholesterol: 57 mg/dL (ref 0–99)
NonHDL: 72.36
Total CHOL/HDL Ratio: 2
Triglycerides: 75 mg/dL (ref 0.0–149.0)
VLDL: 15 mg/dL (ref 0.0–40.0)

## 2021-07-29 LAB — TSH: TSH: 1.34 u[IU]/mL (ref 0.35–5.50)

## 2021-07-29 LAB — HEMOGLOBIN A1C: Hgb A1c MFr Bld: 5.8 % (ref 4.6–6.5)

## 2021-07-29 LAB — COMPREHENSIVE METABOLIC PANEL
ALT: 31 U/L (ref 0–35)
AST: 34 U/L (ref 0–37)
Albumin: 3.9 g/dL (ref 3.5–5.2)
Alkaline Phosphatase: 80 U/L (ref 39–117)
BUN: 9 mg/dL (ref 6–23)
CO2: 28 mEq/L (ref 19–32)
Calcium: 9.2 mg/dL (ref 8.4–10.5)
Chloride: 105 mEq/L (ref 96–112)
Creatinine, Ser: 0.56 mg/dL (ref 0.40–1.20)
GFR: 105.67 mL/min (ref >60.00)
Glucose, Bld: 93 mg/dL (ref 70–99)
Potassium: 4.2 mEq/L (ref 3.5–5.1)
Sodium: 138 mEq/L (ref 135–145)
Total Bilirubin: 0.7 mg/dL (ref 0.2–1.2)
Total Protein: 7 g/dL (ref 6.0–8.3)

## 2021-07-29 MED ORDER — ROSUVASTATIN CALCIUM 10 MG PO TABS: 10.0000 mg | ORAL_TABLET | Freq: Every day | ORAL | 3 refills | Status: DC

## 2021-07-29 MED ORDER — CITALOPRAM HYDROBROMIDE 40 MG PO TABS: 40.0000 mg | ORAL_TABLET | Freq: Every day | ORAL | 1 refills | Status: DC

## 2021-07-29 MED ORDER — TEMAZEPAM 30 MG PO CAPS: 30.0000 mg | ORAL_CAPSULE | Freq: Every evening | ORAL | 2 refills | Status: DC | PRN

## 2021-07-29 NOTE — Progress Notes (Signed)
Subjective:    Patient ID: Holly Horn, female    DOB: Jan 17, 1971, 51 y.o.   MRN: [id number]  HPI Patient presents for yearly preventative medicine examination. She is a pleasant 50 year old female who  has a past medical history of Ectopic pregnancy, Essential hypertension, GERD (gastroesophageal reflux disease), and Hyperlipidemia.  Insomnia -in the past has been on restoral 15 mg but did not find this very helpful.  Has a longstanding history of sleep disturbance.  Reports that she may get a few hours of sleep at night.  Hyperlipidemia-prescribed Crestor 10 mg daily.  Denies myalgia or fatigue Lab Results  Component Value Date   CHOL 224 (H) 05/02/2020   HDL 67 05/02/2020   LDLCALC 138 (H) 05/02/2020   TRIG 91 05/02/2020   CHOLHDL 3.3 05/02/2020   Hypertension-takes Cozaar 50 mg daily.  Denies dizziness, lightheadedness, chest pain, shortness of breath  BP Readings from Last 3 Encounters:  07/29/21 122/88  01/06/21 (!) 135/92  12/02/20 138/86   Anxiety/depression-currently prescribed Celexa 40 mg daily.  He continues to deal with depressive issues, not suicidal.  Her father passing away in January 2022 and her weight her major causes of her depression.  She reports that she comes home from work and go straight to the couch to watch TV.  It is not doing anything physically active.  Obesity -in the past had done well with phentermine, quit taking this in the summer.  Has not been doing any exercise nor eating healthy.  Her plan after the first of the year with her fianc is a start eating healthier and doing more physical exercise.   All immunizations and health maintenance protocols were reviewed with the patient and needed orders were placed.  Appropriate screening laboratory values were ordered for the patient including screening of hyperlipidemia, renal function and hepatic function.   Medication reconciliation,  past medical history, social history, problem list and  allergies were reviewed in detail with the patient  Goals were established with regard to weight loss, exercise, and  diet in compliance with medications Wt Readings from Last 3 Encounters:  07/29/21 292 lb (132.5 kg)  01/06/21 277 lb (125.6 kg)  12/02/20 275 lb 9.6 oz (125 kg)   She is up-to-date on routine colon cancer screening.  Her colonoscopy in November 2021 showed 5 tubular adenomas and 1 neuroendocrine tumor.  She will have a repeat colonoscopy in 3 years  Review of Systems  Constitutional: Negative.   HENT: Negative.    Eyes: Negative.   Respiratory: Negative.    Cardiovascular: Negative.   Gastrointestinal: Negative.   Endocrine: Negative.   Genitourinary: Negative.   Musculoskeletal: Negative.   Skin: Negative.   Allergic/Immunologic: Negative.   Neurological: Negative.   Hematological: Negative.   Psychiatric/Behavioral:  Positive for sleep disturbance and suicidal ideas.    Past Medical History:  Diagnosis Date   Ectopic pregnancy    x 3   Essential hypertension    GERD (gastroesophageal reflux disease)    Hyperlipidemia     Social History   Socioeconomic History   Marital status: Significant Other    Spouse name: Not on file   Number of children: Not on file   Years of education: Not on file   Highest education level: Not on file  Occupational History   Not on file  Tobacco Use   Smoking status: Some Days    Types: Cigarettes   Smokeless tobacco: Never   Tobacco comments:  socially  Vaping Use   Vaping Use: Never used  Substance and Sexual Activity   Alcohol use: Yes    Alcohol/week: 12.0 standard drinks    Types: 12 Standard drinks or equivalent per week   Drug use: No   Sexual activity: Not on file  Other Topics Concern   Not on file  Social History Narrative   Not on file   Social Determinants of Health   Financial Resource Strain: Not on file  Food Insecurity: Not on file  Transportation Needs: Not on file  Physical Activity:  Not on file  Stress: Not on file  Social Connections: Not on file  Intimate Partner Violence: Not on file    Past Surgical History:  Procedure Laterality Date   BIOPSY  01/06/2021   Procedure: BIOPSY;  Surgeon: Irving Copas., MD;  Location: [facility];  Service: Gastroenterology;;   COLONOSCOPY  06/2020   ECTOPIC PREGNANCY SURGERY     ENDOSCOPIC MUCOSAL RESECTION  08/14/2020   Procedure: ENDOSCOPIC MUCOSAL RESECTION;  Surgeon: Irving Copas., MD;  Location: [place];  Service: Gastroenterology;;   ESOPHAGOGASTRODUODENOSCOPY N/A 01/06/2021   Procedure: ESOPHAGOGASTRODUODENOSCOPY (EGD);  Surgeon: Irving Copas., MD;  Location: [facility];  Service: Gastroenterology;  Laterality: N/A;   EUS N/A 08/14/2020   Procedure: LOWER ENDOSCOPIC ULTRASOUND (EUS);  Surgeon: Irving Copas., MD;  Location: [place];  Service: Gastroenterology;  Laterality: N/A;   EUS N/A 01/06/2021   Procedure: UPPER ENDOSCOPIC ULTRASOUND (EUS) RADIAL;  Surgeon: Irving Copas., MD;  Location: [facility];  Service: Gastroenterology;  Laterality: N/A;   FINE NEEDLE ASPIRATION  01/06/2021   Procedure: FINE NEEDLE ASPIRATION (FNA) LINEAR;  Surgeon: Irving Copas., MD;  Location: [facility];  Service: Gastroenterology;;   Otho Darner SIGMOIDOSCOPY N/A 08/14/2020   Procedure: Beryle Quant;  Surgeon: Irving Copas., MD;  Location: [place];  Service: Gastroenterology;  Laterality: N/A;   HEMOSTASIS CLIP PLACEMENT  08/14/2020   Procedure: HEMOSTASIS CLIP PLACEMENT;  Surgeon: Irving Copas., MD;  Location: [place];  Service: Gastroenterology;;   SUBMUCOSAL LIFTING INJECTION  08/14/2020   Procedure: SUBMUCOSAL LIFTING INJECTION;  Surgeon: Irving Copas., MD;  Location: [facility];  Service: Gastroenterology;;   [facility]  2008 & 2010    Family History  Problem Relation Age of Onset   Hypertension Mother     Hypertension Brother    Osteoarthritis Father    Breast cancer Other    Cancer Maternal Grandmother        unknown   Cancer Maternal Grandfather        unknown   Cancer Paternal Grandmother        unknown   Cancer Paternal Grandfather        unknowon   Colon cancer Neg Hx    Colon polyps Neg Hx    Esophageal cancer Neg Hx    Rectal cancer Neg Hx    Stomach cancer Neg Hx    Inflammatory bowel disease Neg Hx    Liver disease Neg Hx    Pancreatic cancer Neg Hx     Allergies  Allergen Reactions   Morphine Sulfate Shortness Of Breath and Nausea And Vomiting   Lisinopril Cough   Progesterone Nausea Only    Current Outpatient Medications on File Prior to Visit  Medication Sig Dispense Refill   losartan (COZAAR) 50 MG tablet TAKE 1 TABLET BY MOUTH EVERY DAY 90 tablet 3   omeprazole (PRILOSEC) 20 MG capsule TAKE 1 CAPSULE  BY MOUTH EVERY DAY 30 capsule 0   rosuvastatin (CRESTOR) 10 MG tablet TAKE 1 TABLET BY MOUTH EVERY DAY 30 tablet 0   ibuprofen (ADVIL) 200 MG tablet Take 400 mg by mouth every 8 (eight) hours as needed (pain). (Patient not taking: Reported on 07/29/2021)     phentermine 15 MG capsule Take 1 capsule (15 mg total) by mouth every morning. (Patient not taking: Reported on 07/29/2021) 30 capsule 2   No current facility-administered medications on file prior to visit.    BP 122/88    Pulse 72    Temp 98.1 F (36.7 C) (Oral)    Ht 5\' 7"  (1.702 m)    Wt 292 lb (132.5 kg)    LMP 08/23/2013    SpO2 95%    BMI 45.73 kg/m        Objective:   Physical Exam Vitals and nursing note reviewed.  Constitutional:      General: She is not in acute distress.    Appearance: Normal appearance. She is well-developed. She is obese. She is not ill-appearing.  HENT:     Head: Normocephalic and atraumatic.     Right Ear: Tympanic membrane, ear canal and external ear normal. There is no impacted cerumen.     Left Ear: Tympanic membrane, ear canal and external ear normal. There is no  impacted cerumen.     Nose: Nose normal. No congestion or rhinorrhea.     Mouth/Throat:     Mouth: Mucous membranes are moist.     Pharynx: Oropharynx is clear. No oropharyngeal exudate or posterior oropharyngeal erythema.  Eyes:     General:        Right eye: No discharge.        Left eye: No discharge.     Extraocular Movements: Extraocular movements intact.     Conjunctiva/sclera: Conjunctivae normal.     Pupils: Pupils are equal, round, and reactive to light.  Neck:     Thyroid: No thyromegaly.     Vascular: No carotid bruit.     Trachea: No tracheal deviation.  Cardiovascular:     Rate and Rhythm: Normal rate and regular rhythm.     Pulses: Normal pulses.     Heart sounds: Normal heart sounds. No murmur heard.   No friction rub. No gallop.  Pulmonary:     Effort: Pulmonary effort is normal. No respiratory distress.     Breath sounds: Normal breath sounds. No stridor. No wheezing, rhonchi or rales.  Chest:     Chest wall: No tenderness.  Abdominal:     General: Abdomen is flat. Bowel sounds are normal. There is no distension.     Palpations: Abdomen is soft. There is no mass.     Tenderness: There is no abdominal tenderness. There is no right CVA tenderness, left CVA tenderness, guarding or rebound.     Hernia: No hernia is present.  Musculoskeletal:        General: No swelling, tenderness, deformity or signs of injury. Normal range of motion.     Cervical back: Normal range of motion and neck supple.     Right lower leg: No edema.     Left lower leg: No edema.  Lymphadenopathy:     Cervical: No cervical adenopathy.  Skin:    General: Skin is warm and dry.     Coloration: Skin is not jaundiced or pale.     Findings: No bruising, erythema, lesion or rash.  Neurological:  General: No focal deficit present.     Mental Status: She is alert and oriented to person, place, and time.     Cranial Nerves: No cranial nerve deficit.     Sensory: No sensory deficit.      Motor: No weakness.     Coordination: Coordination normal.     Gait: Gait normal.     Deep Tendon Reflexes: Reflexes normal.  Psychiatric:        Mood and Affect: Mood normal.        Behavior: Behavior normal.        Thought Content: Thought content normal.        Judgment: Judgment normal.      Assessment & Plan:  1. Routine general medical examination at a health care facility - Needs significant weight loss - Encouraged lifestyle modifications  - Follow up in one year or sooner if needed - CBC with Differential/Platelet; Future - Comprehensive metabolic panel; Future - Hemoglobin A1c; Future - Lipid panel; Future - TSH; Future - TSH - Lipid panel - Hemoglobin A1c - Comprehensive metabolic panel - CBC with Differential/Platelet  2. Class 3 severe obesity with serious comorbidity and body mass index (BMI) of 40.0 to 44.9 in adult, unspecified obesity type ([place]) - Would like her to start working on routine exercise. Can consider adding phentermine back or trying Wegovy  - Follow up in one month  - CBC with Differential/Platelet; Future - Comprehensive metabolic panel; Future - Hemoglobin A1c; Future - Lipid panel; Future - TSH; Future - TSH - Lipid panel - Hemoglobin A1c - Comprehensive metabolic panel - CBC with Differential/Platelet  3. Anxiety and depression PHQ9 SCORE ONLY 07/29/2021 06/03/2020 04/24/2019  PHQ-9 Total Score 12 0 0   - Will keep her on Celexa 40 mg. Would like her to start exercising, which I think will help with her depression   - citalopram (CELEXA) 40 MG tablet; Take 1 tablet (40 mg total) by mouth daily.  Dispense: 90 tablet; Refill: 1  4. Essential hypertension - Controlled.  - CBC with Differential/Platelet; Future - Comprehensive metabolic panel; Future - Hemoglobin A1c; Future - Lipid panel; Future - TSH; Future - TSH - Lipid panel - Hemoglobin A1c - Comprehensive metabolic panel - CBC with Differential/Platelet  5. Primary  insomnia - Will increase Restoril to 30 mg QHS - Follow up in one month  - CBC with Differential/Platelet; Future - Comprehensive metabolic panel; Future - Hemoglobin A1c; Future - Lipid panel; Future - TSH; Future - temazepam (RESTORIL) 30 MG capsule; Take 1 capsule (30 mg total) by mouth at bedtime as needed for sleep.  Dispense: 30 capsule; Refill: 2 - TSH - Lipid panel - Hemoglobin A1c - Comprehensive metabolic panel - CBC with Differential/Platelet  6. Mixed hyperlipidemia - Consider increase in statin  - CBC with Differential/Platelet; Future - Comprehensive metabolic panel; Future - Hemoglobin A1c; Future - Lipid panel; Future - TSH; Future - TSH - Lipid panel - Hemoglobin A1c - Comprehensive metabolic panel - CBC with Differential/Platelet  7. Need for hepatitis C screening test  - Hep C Antibody; Future - Hep C Antibody  8. Need for pneumococcal vaccination  - Pneumococcal conjugate vaccine 20-valent (Prevnar 20)  9. Need for immunization against influenza  - Flu Vaccine QUAD 53mo+IM (Fluarix, Fluzone & Alfiuria Quad PF)  Dorothyann Peng, NP

## 2021-07-29 NOTE — Patient Instructions (Signed)
It was great seeing you today   We will follow up with you regarding your labs   Please come back in one month

## 2021-07-30 LAB — HEPATITIS C ANTIBODY
Hepatitis C Ab: NONREACTIVE
SIGNAL TO CUT-OFF: 0.03 (ref <1.00)

## 2021-09-01 ENCOUNTER — Encounter: Payer: Self-pay | Admitting: Adult Health

## 2021-09-01 ENCOUNTER — Ambulatory Visit: Payer: No Typology Code available for payment source | Admitting: Adult Health

## 2021-09-01 VITALS — BP 132/82 | HR 71 | Temp 97.8°F | Ht 67.0 in | Wt 288.1 lb

## 2021-09-01 DIAGNOSIS — F419 Anxiety disorder, unspecified: Secondary | ICD-10-CM

## 2021-09-01 DIAGNOSIS — Z6841 Body Mass Index (BMI) 40.0 and over, adult: Secondary | ICD-10-CM

## 2021-09-01 DIAGNOSIS — R7303 Prediabetes: Secondary | ICD-10-CM

## 2021-09-01 DIAGNOSIS — Z23 Encounter for immunization: Secondary | ICD-10-CM | POA: Diagnosis not present

## 2021-09-01 DIAGNOSIS — F32A Depression, unspecified: Secondary | ICD-10-CM

## 2021-09-01 MED ORDER — TRULICITY 0.75 MG/0.5ML ~~LOC~~ SOAJ: 0.7500 mg | SUBCUTANEOUS | 0 refills | Status: DC

## 2021-09-01 MED ORDER — WEGOVY 0.25 MG/0.5ML ~~LOC~~ SOAJ: 0.2500 mg | SUBCUTANEOUS | 0 refills | Status: DC

## 2021-09-01 NOTE — Patient Instructions (Signed)
I am going to put you on [facility]. Let me know if it is covered .   Also check out the website for a savings card   Please follow up in one month to see how you are doing

## 2021-09-01 NOTE — Progress Notes (Signed)
Subjective:    Patient ID: Holly Horn, female    DOB: Apr 04, 1971, 51 y.o.   MRN: [id number]  HPI 51 year old female who  has a past medical history of Ectopic pregnancy, Essential hypertension, GERD (gastroesophageal reflux disease), and Hyperlipidemia.  She presents to the office today for follow-up regarding anxiety/depression, and morbid obesity.  When she was last seen roughly 1 month ago for her CPE she reported that she continue to deal with depressive issues but was not suicidal.  Her father passing away roughly a year ago and her weight were major causes of depression.  She was finding herself coming home from work and going straight to the couch to watch TV.  She was not doing anything physically active.  We decided to keep her on Celexa 40 mg for the time being.  Wanted her to start working on weight loss management through exercise and heart healthy diet to see if this helps with her depression. She does report that she is feeling much better mentally and is in a better place with her happiness.   Obesity-longstanding issue for most of her life.  In the past she has been on phentermine but quit taking this in the summer 2022.  Her plan at the beginning of 2023 was to start eating healthier and doing more physical exercise with her fianc.  Today she reports that she is eating healthier and has started walking.   Wt Readings from Last 3 Encounters:  09/01/21 288 lb 1.6 oz (130.7 kg)  07/29/21 292 lb (132.5 kg)  01/06/21 277 lb (125.6 kg)   Review of Systems See HPI   Past Medical History:  Diagnosis Date   Ectopic pregnancy    x 3   Essential hypertension    GERD (gastroesophageal reflux disease)    Hyperlipidemia     Social History   Socioeconomic History   Marital status: Significant Other    Spouse name: Not on file   Number of children: Not on file   Years of education: Not on file   Highest education level: Bachelor's degree (e.g., BA, AB, BS)   Occupational History   Not on file  Tobacco Use   Smoking status: Some Days    Types: Cigarettes   Smokeless tobacco: Never   Tobacco comments:    socially  Vaping Use   Vaping Use: Never used  Substance and Sexual Activity   Alcohol use: Yes    Alcohol/week: 12.0 standard drinks    Types: 12 Standard drinks or equivalent per week   Drug use: No   Sexual activity: Not on file  Other Topics Concern   Not on file  Social History Narrative   Not on file   Social Determinants of Health   Financial Resource Strain: Low Risk    Difficulty of Paying Living Expenses: Not very hard  Food Insecurity: No Food Insecurity   Worried About Running Out of Food in the Last Year: Never true   Ran Out of Food in the Last Year: Never true  Transportation Needs: No Transportation Needs   Lack of Transportation (Medical): No   Lack of Transportation (Non-Medical): No  Physical Activity: Insufficiently Active   Days of Exercise per Week: 2 days   Minutes of Exercise per Session: 10 min  Stress: Stress Concern Present   Feeling of Stress : Rather much  Social Connections: Moderately Isolated   Frequency of Communication with Friends and Family: Three times a week  Frequency of Social Gatherings with Friends and Family: Never   Attends Religious Services: Never   Marine scientist or Organizations: No   Attends Music therapist: Not on file   Marital Status: Living with partner  Intimate Partner Violence: Not on file    Past Surgical History:  Procedure Laterality Date   BIOPSY  01/06/2021   Procedure: BIOPSY;  Surgeon: Mansouraty, Telford Nab., MD;  Location: [facility];  Service: Gastroenterology;;   COLONOSCOPY  06/2020   ECTOPIC PREGNANCY SURGERY     ENDOSCOPIC MUCOSAL RESECTION  08/14/2020   Procedure: ENDOSCOPIC MUCOSAL RESECTION;  Surgeon: Irving Copas., MD;  Location: [place];  Service: Gastroenterology;;   ESOPHAGOGASTRODUODENOSCOPY N/A  01/06/2021   Procedure: ESOPHAGOGASTRODUODENOSCOPY (EGD);  Surgeon: Irving Copas., MD;  Location: [facility];  Service: Gastroenterology;  Laterality: N/A;   EUS N/A 08/14/2020   Procedure: LOWER ENDOSCOPIC ULTRASOUND (EUS);  Surgeon: Irving Copas., MD;  Location: [place];  Service: Gastroenterology;  Laterality: N/A;   EUS N/A 01/06/2021   Procedure: UPPER ENDOSCOPIC ULTRASOUND (EUS) RADIAL;  Surgeon: Irving Copas., MD;  Location: [facility];  Service: Gastroenterology;  Laterality: N/A;   FINE NEEDLE ASPIRATION  01/06/2021   Procedure: FINE NEEDLE ASPIRATION (FNA) LINEAR;  Surgeon: Irving Copas., MD;  Location: [facility];  Service: Gastroenterology;;   Otho Darner SIGMOIDOSCOPY N/A 08/14/2020   Procedure: Beryle Quant;  Surgeon: Irving Copas., MD;  Location: [place];  Service: Gastroenterology;  Laterality: N/A;   HEMOSTASIS CLIP PLACEMENT  08/14/2020   Procedure: HEMOSTASIS CLIP PLACEMENT;  Surgeon: Irving Copas., MD;  Location: [place];  Service: Gastroenterology;;   SUBMUCOSAL LIFTING INJECTION  08/14/2020   Procedure: SUBMUCOSAL LIFTING INJECTION;  Surgeon: Irving Copas., MD;  Location: [facility];  Service: Gastroenterology;;   [facility]  2008 & 2010    Family History  Problem Relation Age of Onset   Hypertension Mother    Hypertension Brother    Osteoarthritis Father    Breast cancer Other    Cancer Maternal Grandmother        unknown   Cancer Maternal Grandfather        unknown   Cancer Paternal Grandmother        unknown   Cancer Paternal Grandfather        unknowon   Colon cancer Neg Hx    Colon polyps Neg Hx    Esophageal cancer Neg Hx    Rectal cancer Neg Hx    Stomach cancer Neg Hx    Inflammatory bowel disease Neg Hx    Liver disease Neg Hx    Pancreatic cancer Neg Hx     Allergies  Allergen Reactions   Morphine Sulfate Shortness Of Breath and Nausea And  Vomiting   Lisinopril Cough   Progesterone Nausea Only    Current Outpatient Medications on File Prior to Visit  Medication Sig Dispense Refill   citalopram (CELEXA) 40 MG tablet Take 1 tablet (40 mg total) by mouth daily. 90 tablet 1   ibuprofen (ADVIL) 200 MG tablet Take 400 mg by mouth every 8 (eight) hours as needed (pain).     losartan (COZAAR) 50 MG tablet TAKE 1 TABLET BY MOUTH EVERY DAY 90 tablet 3   omeprazole (PRILOSEC) 20 MG capsule TAKE 1 CAPSULE BY MOUTH EVERY DAY 90 capsule 1   rosuvastatin (CRESTOR) 10 MG tablet Take 1 tablet (10 mg total) by mouth daily. 90 tablet 3   temazepam (RESTORIL)  30 MG capsule Take 1 capsule (30 mg total) by mouth at bedtime as needed for sleep. 30 capsule 2   No current facility-administered medications on file prior to visit.    BP 132/82 (BP Location: Left Arm, Patient Position: Sitting, Cuff Size: Large)    Pulse 71    Temp 97.8 F (36.6 C) (Oral)    Ht 5\' 7"  (1.702 m)    Wt 288 lb 1.6 oz (130.7 kg)    LMP 08/23/2013    SpO2 98%    BMI 45.12 kg/m       Objective:   Physical Exam Vitals and nursing note reviewed.  Constitutional:      Appearance: Normal appearance.  Cardiovascular:     Rate and Rhythm: Normal rate and regular rhythm.     Pulses: Normal pulses.     Heart sounds: Normal heart sounds.  Pulmonary:     Effort: Pulmonary effort is normal.     Breath sounds: Normal breath sounds.  Musculoskeletal:        General: Normal range of motion.  Skin:    General: Skin is warm and dry.     Capillary Refill: Capillary refill takes less than 2 seconds.  Neurological:     General: No focal deficit present.     Mental Status: She is alert and oriented to person, place, and time.  Psychiatric:        Mood and Affect: Mood normal.        Behavior: Behavior normal.        Thought Content: Thought content normal.        Judgment: Judgment normal.      Assessment & Plan:  1. Pre-diabetes - Will try and get her approved for  trulicty since Wegovy is not covered - Dulaglutide (TRULICITY) 0.01 VC/9.4WH SOPN; Inject 0.75 mg into the skin once a week.  Dispense: 2 mL; Refill: 0  2. Class 3 severe obesity with serious comorbidity and body mass index (BMI) of 40.0 to 44.9 in adult, unspecified obesity type ([place]) - Continue to work on lifestyle modifications   3. Anxiety and depression - better controlled.   4. Need for shingles vaccine  - Varicella-zoster vaccine IM (Shingrix)   Dorothyann Peng, NP

## 2021-09-07 ENCOUNTER — Telehealth: Payer: Self-pay | Admitting: Adult Health

## 2021-09-07 NOTE — Telephone Encounter (Signed)
Fredrich Birks (friend of patient) called in stating that Tom[facility] Rumps said that he would accept her as a new patient.  Is this true?  Please advise.

## 2021-09-07 NOTE — Telephone Encounter (Signed)
Please advise 

## 2021-09-08 NOTE — Telephone Encounter (Signed)
Noted  

## 2021-09-23 ENCOUNTER — Encounter: Payer: Self-pay | Admitting: Adult Health

## 2021-09-29 ENCOUNTER — Other Ambulatory Visit: Payer: Self-pay | Admitting: Adult Health

## 2021-09-29 DIAGNOSIS — R7303 Prediabetes: Secondary | ICD-10-CM

## 2021-10-06 ENCOUNTER — Ambulatory Visit: Payer: No Typology Code available for payment source | Admitting: Adult Health

## 2021-10-06 VITALS — BP 110/80 | HR 80 | Temp 98.5°F | Ht 67.0 in | Wt 279.0 lb

## 2021-10-06 DIAGNOSIS — I10 Essential (primary) hypertension: Secondary | ICD-10-CM

## 2021-10-06 DIAGNOSIS — Z6841 Body Mass Index (BMI) 40.0 and over, adult: Secondary | ICD-10-CM

## 2021-10-06 DIAGNOSIS — R7303 Prediabetes: Secondary | ICD-10-CM | POA: Diagnosis not present

## 2021-10-06 MED ORDER — TRULICITY 0.75 MG/0.5ML ~~LOC~~ SOAJ: 0.7500 mg | SUBCUTANEOUS | 0 refills | Status: DC

## 2021-10-06 NOTE — Progress Notes (Signed)
Subjective:    Patient ID: Holly Horn, female    DOB: 31-May-1971, 51 y.o.   MRN: [id number]  HPI 51 year old female who  has a past medical history of Ectopic pregnancy, Essential hypertension, GERD (gastroesophageal reflux disease), and Hyperlipidemia.  She presets to the office today for follow up regarding pre diabetes and HTN   Pre diabetes -was started on Trulicity 4.88 mg weekly about 5 weeks ago.  She reports tolerating this medication well, has not had any side effects such as nausea, vomiting, diarrhea, or constipation.  Starting the medication her appetite has been suppressed.  She is eating healthy, and trying to exercise.  She has been able to lose a roughly 9 pounds.  Has noticed her clothes fitting looser and having more energy.  Wt Readings from Last 3 Encounters:  10/06/21 279 lb (126.6 kg)  09/01/21 288 lb 1.6 oz (130.7 kg)  07/29/21 292 lb (132.5 kg)   HTN -managed with Cozaar 50 mg daily.  She denies dizziness, lightheadedness, chest pain, shortness of breath  BP Readings from Last 3 Encounters:  10/06/21 110/80  09/01/21 132/82  07/29/21 122/88   Review of Systems See HPI   Past Medical History:  Diagnosis Date   Ectopic pregnancy    x 3   Essential hypertension    GERD (gastroesophageal reflux disease)    Hyperlipidemia     Social History   Socioeconomic History   Marital status: Significant Other    Spouse name: Not on file   Number of children: Not on file   Years of education: Not on file   Highest education level: Bachelor's degree (e.g., BA, AB, BS)  Occupational History   Not on file  Tobacco Use   Smoking status: Some Days    Types: Cigarettes   Smokeless tobacco: Never   Tobacco comments:    socially  Vaping Use   Vaping Use: Never used  Substance and Sexual Activity   Alcohol use: Yes    Alcohol/week: 12.0 standard drinks    Types: 12 Standard drinks or equivalent per week   Drug use: No   Sexual activity: Not on file   Other Topics Concern   Not on file  Social History Narrative   Not on file   Social Determinants of Health   Financial Resource Strain: Low Risk    Difficulty of Paying Living Expenses: Not very hard  Food Insecurity: No Food Insecurity   Worried About Running Out of Food in the Last Year: Never true   Ran Out of Food in the Last Year: Never true  Transportation Needs: No Transportation Needs   Lack of Transportation (Medical): No   Lack of Transportation (Non-Medical): No  Physical Activity: Insufficiently Active   Days of Exercise per Week: 2 days   Minutes of Exercise per Session: 10 min  Stress: Stress Concern Present   Feeling of Stress : Rather much  Social Connections: Moderately Isolated   Frequency of Communication with Friends and Family: Three times a week   Frequency of Social Gatherings with Friends and Family: Never   Attends Religious Services: Never   Marine scientist or Organizations: No   Attends Music therapist: Not on file   Marital Status: Living with partner  Intimate Partner Violence: Not on file    Past Surgical History:  Procedure Laterality Date   BIOPSY  01/06/2021   Procedure: BIOPSY;  Surgeon: Mansouraty, Telford Nab., MD;  Location: [facility];  Service: Gastroenterology;;   COLONOSCOPY  06/2020   ECTOPIC PREGNANCY SURGERY     ENDOSCOPIC MUCOSAL RESECTION  08/14/2020   Procedure: ENDOSCOPIC MUCOSAL RESECTION;  Surgeon: Irving Copas., MD;  Location: [place];  Service: Gastroenterology;;   ESOPHAGOGASTRODUODENOSCOPY N/A 01/06/2021   Procedure: ESOPHAGOGASTRODUODENOSCOPY (EGD);  Surgeon: Irving Copas., MD;  Location: [facility];  Service: Gastroenterology;  Laterality: N/A;   EUS N/A 08/14/2020   Procedure: LOWER ENDOSCOPIC ULTRASOUND (EUS);  Surgeon: Irving Copas., MD;  Location: [place];  Service: Gastroenterology;  Laterality: N/A;   EUS N/A 01/06/2021   Procedure: UPPER ENDOSCOPIC  ULTRASOUND (EUS) RADIAL;  Surgeon: Irving Copas., MD;  Location: [facility];  Service: Gastroenterology;  Laterality: N/A;   FINE NEEDLE ASPIRATION  01/06/2021   Procedure: FINE NEEDLE ASPIRATION (FNA) LINEAR;  Surgeon: Irving Copas., MD;  Location: [facility];  Service: Gastroenterology;;   Otho Darner SIGMOIDOSCOPY N/A 08/14/2020   Procedure: Beryle Quant;  Surgeon: Irving Copas., MD;  Location: [place];  Service: Gastroenterology;  Laterality: N/A;   HEMOSTASIS CLIP PLACEMENT  08/14/2020   Procedure: HEMOSTASIS CLIP PLACEMENT;  Surgeon: Irving Copas., MD;  Location: [place];  Service: Gastroenterology;;   SUBMUCOSAL LIFTING INJECTION  08/14/2020   Procedure: SUBMUCOSAL LIFTING INJECTION;  Surgeon: Irving Copas., MD;  Location: [facility];  Service: Gastroenterology;;   [facility]  2008 & 2010    Family History  Problem Relation Age of Onset   Hypertension Mother    Hypertension Brother    Osteoarthritis Father    Breast cancer Other    Cancer Maternal Grandmother        unknown   Cancer Maternal Grandfather        unknown   Cancer Paternal Grandmother        unknown   Cancer Paternal Grandfather        unknowon   Colon cancer Neg Hx    Colon polyps Neg Hx    Esophageal cancer Neg Hx    Rectal cancer Neg Hx    Stomach cancer Neg Hx    Inflammatory bowel disease Neg Hx    Liver disease Neg Hx    Pancreatic cancer Neg Hx     Allergies  Allergen Reactions   Morphine Sulfate Shortness Of Breath and Nausea And Vomiting   Lisinopril Cough   Progesterone Nausea Only    Current Outpatient Medications on File Prior to Visit  Medication Sig Dispense Refill   citalopram (CELEXA) 40 MG tablet Take 1 tablet (40 mg total) by mouth daily. 90 tablet 1   ibuprofen (ADVIL) 200 MG tablet Take 400 mg by mouth every 8 (eight) hours as needed (pain).     losartan (COZAAR) 50 MG tablet TAKE 1 TABLET BY MOUTH EVERY  DAY 90 tablet 3   omeprazole (PRILOSEC) 20 MG capsule TAKE 1 CAPSULE BY MOUTH EVERY DAY 90 capsule 1   rosuvastatin (CRESTOR) 10 MG tablet Take 1 tablet (10 mg total) by mouth daily. 90 tablet 3   temazepam (RESTORIL) 30 MG capsule Take 1 capsule (30 mg total) by mouth at bedtime as needed for sleep. 30 capsule 2   TRULICITY 8.67 EH/2.0NO SOPN INJECT 0.75 MG INTO THE SKIN ONCE A WEEK. 2 mL 0   No current facility-administered medications on file prior to visit.    BP 110/80    Pulse 80    Temp 98.5 F (36.9 C) (Oral)    Ht 5\' 7"  (1.702 m)  Wt 279 lb (126.6 kg)    LMP 08/23/2013    SpO2 95%    BMI 43.70 kg/m       Objective:   Physical Exam Vitals and nursing note reviewed.  Constitutional:      Appearance: Normal appearance.  Cardiovascular:     Rate and Rhythm: Normal rate and regular rhythm.     Pulses: Normal pulses.     Heart sounds: Normal heart sounds.  Pulmonary:     Effort: Pulmonary effort is normal.     Breath sounds: Normal breath sounds.  Musculoskeletal:        General: Normal range of motion.  Skin:    General: Skin is warm and dry.  Neurological:     General: No focal deficit present.     Mental Status: She is alert and oriented to person, place, and time.  Psychiatric:        Mood and Affect: Mood normal.        Behavior: Behavior normal.        Thought Content: Thought content normal.        Judgment: Judgment normal.       Assessment & Plan:  1. Pre-diabetes  - Dulaglutide (TRULICITY) 3.90 ZE/0.9QZ SOPN; Inject 0.75 mg into the skin once a week.  Dispense: 6 mL; Refill: 0 - Follow up in 3 months   2. Class 3 severe obesity with serious comorbidity and body mass index (BMI) of 40.0 to 44.9 in adult, unspecified obesity type (HCC) - Weight loss of 9 pounds over the last month  - Continue to exercise and eat healthy   3. Essential hypertension - well controlled.  - No change in medications  Dorothyann Peng, NP

## 2021-10-06 NOTE — Patient Instructions (Signed)
Health Maintenance Due  Topic Date Due   COVID-19 Vaccine (3 - Booster for Moderna series) 01/15/2020    Depression screen [facility] 2/9 09/01/2021 07/29/2021 06/03/2020  Decreased Interest 1 1 0  Down, Depressed, Hopeless 1 1 0  PHQ - 2 Score 2 2 0  Altered sleeping 0 2 0  Tired, decreased energy 1 3 0  Change in appetite 1 3 0  Feeling bad or failure about yourself  1 1 0  Trouble concentrating 0 1 0  Moving slowly or fidgety/restless 0 0 0  Suicidal thoughts 0 0 0  PHQ-9 Score 5 12 0  Difficult doing work/chores - Somewhat difficult Not difficult at all

## 2021-10-20 ENCOUNTER — Telehealth: Payer: Self-pay | Admitting: Gastroenterology

## 2021-10-20 NOTE — Telephone Encounter (Signed)
Inbound call from patient requesting to speak with a nurse please in regards to stomach pain she is experiencing. ?

## 2021-10-20 NOTE — Telephone Encounter (Signed)
Patient called in with complaints of nausea, diarrhea, and poor appetite for the last 3 days. Patient is able to hold down fluids/food. She stated it has been almost a year since she was last seen, and would like to know if she should come in for lab work & an office visit.  ? ?Dr. Rush Landmark, please advise. Thank you. ?

## 2021-10-20 NOTE — Telephone Encounter (Signed)
The patient can come in for a CBC/CMP/lipase/amylase in the next few days. ?If she is having diarrhea she can have a GI pathogen panel and C. difficile stool test performed. ?If she needs something for nausea she can be given Zofran 8 mg ODT every 8 hours as needed 20 tablets total no refills. ?If she cannot get the stool sample completed she can take Imodium 4 mg (first dose in the morning) and use up to 10 mg total per day. ?She can be set up with a follow-up in clinic with one of the APP's or myself next available. ?Thanks. ?GM ?

## 2021-10-21 NOTE — Telephone Encounter (Signed)
Left message for patient to call back  

## 2021-10-22 ENCOUNTER — Other Ambulatory Visit: Payer: Self-pay

## 2021-10-22 MED ORDER — ONDANSETRON 8 MG PO TBDP: 8.0000 mg | ORAL_TABLET | Freq: Three times a day (TID) | ORAL | 0 refills | Status: DC | PRN

## 2021-10-22 NOTE — Telephone Encounter (Signed)
Spoke with patient regarding MD recommendations. She is aware that she will need to come in for labs at her earliest convenience & zofran prescription has been sent in. Patient states she is not having anymore diarrhea, however still nauseous.  ?

## 2021-10-27 ENCOUNTER — Other Ambulatory Visit: Payer: Self-pay | Admitting: Adult Health

## 2021-10-27 ENCOUNTER — Encounter: Payer: Self-pay | Admitting: Adult Health

## 2021-10-27 ENCOUNTER — Other Ambulatory Visit (INDEPENDENT_AMBULATORY_CARE_PROVIDER_SITE_OTHER): Payer: No Typology Code available for payment source

## 2021-10-27 DIAGNOSIS — R630 Anorexia: Secondary | ICD-10-CM

## 2021-10-27 DIAGNOSIS — R197 Diarrhea, unspecified: Secondary | ICD-10-CM

## 2021-10-27 DIAGNOSIS — R110 Nausea: Secondary | ICD-10-CM | POA: Diagnosis not present

## 2021-10-27 LAB — CBC WITH DIFFERENTIAL/PLATELET
Basophils Absolute: 0.1 10*3/uL (ref 0.0–0.1)
Basophils Relative: 0.9 % (ref 0.0–3.0)
Eosinophils Absolute: 0.1 10*3/uL (ref 0.0–0.7)
Eosinophils Relative: 1.4 % (ref 0.0–5.0)
HCT: 38.5 % (ref 36.0–46.0)
Hemoglobin: 12.9 g/dL (ref 12.0–15.0)
Lymphocytes Relative: 32.3 % (ref 12.0–46.0)
Lymphs Abs: 2.9 10*3/uL (ref 0.7–4.0)
MCHC: 33.5 g/dL (ref 30.0–36.0)
MCV: 88.6 fl (ref 78.0–100.0)
Monocytes Absolute: 0.5 10*3/uL (ref 0.1–1.0)
Monocytes Relative: 5.1 % (ref 3.0–12.0)
Neutro Abs: 5.4 10*3/uL (ref 1.4–7.7)
Neutrophils Relative %: 60.3 % (ref 43.0–77.0)
Platelets: 271 10*3/uL (ref 150.0–400.0)
RBC: 4.35 Mil/uL (ref 3.87–5.11)
RDW: 14.9 % (ref 11.5–15.5)
WBC: 8.9 10*3/uL (ref 4.0–10.5)

## 2021-10-27 LAB — COMPREHENSIVE METABOLIC PANEL
ALT: 34 U/L (ref 0–35)
AST: 34 U/L (ref 0–37)
Albumin: 4.1 g/dL (ref 3.5–5.2)
Alkaline Phosphatase: 85 U/L (ref 39–117)
BUN: 11 mg/dL (ref 6–23)
CO2: 28 mEq/L (ref 19–32)
Calcium: 9.6 mg/dL (ref 8.4–10.5)
Chloride: 100 mEq/L (ref 96–112)
Creatinine, Ser: 0.73 mg/dL (ref 0.40–1.20)
GFR: 95.05 mL/min (ref >60.00)
Glucose, Bld: 100 mg/dL — ABNORMAL HIGH (ref 70–99)
Potassium: 4.1 mEq/L (ref 3.5–5.1)
Sodium: 136 mEq/L (ref 135–145)
Total Bilirubin: 0.6 mg/dL (ref 0.2–1.2)
Total Protein: 7 g/dL (ref 6.0–8.3)

## 2021-10-27 LAB — LIPASE: Lipase: 35 U/L (ref 11.0–59.0)

## 2021-10-27 LAB — AMYLASE: Amylase: 29 U/L (ref 27–131)

## 2021-10-27 NOTE — Telephone Encounter (Signed)
Please advise 

## 2021-11-06 ENCOUNTER — Encounter: Payer: Self-pay | Admitting: Adult Health

## 2021-11-06 ENCOUNTER — Telehealth (INDEPENDENT_AMBULATORY_CARE_PROVIDER_SITE_OTHER): Payer: No Typology Code available for payment source | Admitting: Adult Health

## 2021-11-06 VITALS — Ht 67.0 in | Wt 279.0 lb

## 2021-11-06 DIAGNOSIS — J302 Other seasonal allergic rhinitis: Secondary | ICD-10-CM

## 2021-11-06 LAB — POCT RAPID STREP A (OFFICE): Rapid Strep A Screen: NEGATIVE

## 2021-11-06 LAB — POCT INFLUENZA A/B: Influenza A, POC: NEGATIVE

## 2021-11-06 LAB — POC COVID19 BINAXNOW: SARS Coronavirus 2 Ag: NEGATIVE

## 2021-11-06 MED ORDER — AZELASTINE HCL 0.1 % NA SOLN: 2.0000 | Freq: Two times a day (BID) | NASAL | 3 refills | Status: DC

## 2021-11-06 NOTE — Patient Instructions (Signed)
Health Maintenance Due  ?Topic Date Due  ? COVID-19 Vaccine (3 - Booster for Moderna series) 01/15/2020  ? Zoster Vaccines- Shingrix (2 of 2) 10/27/2021  ? ? ? ? Row Labels 09/01/2021  ?  8:26 AM 07/29/2021  ?  9:40 AM 06/03/2020  ?  8:17 AM  ?Depression screen PHQ 2/9   Section Header. No data exists in this row.     ?Decreased Interest   1 1 0  ?Down, Depressed, Hopeless   1 1 0  ?PHQ - 2 Score   2 2 0  ?Altered sleeping   0 2 0  ?Tired, decreased energy   1 3 0  ?Change in appetite   1 3 0  ?Feeling bad or failure about yourself    1 1 0  ?Trouble concentrating   0 1 0  ?Moving slowly or fidgety/restless   0 0 0  ?Suicidal thoughts   0 0 0  ?PHQ-9 Score   5 12 0  ?Difficult doing work/chores    Somewhat difficult Not difficult at all  ? ? ?

## 2021-11-06 NOTE — Telephone Encounter (Signed)
Pt has been scheduled.  °

## 2021-11-06 NOTE — Progress Notes (Signed)
Virtual Visit via Video Note ? ?I connected with Holly Horn on 11/06/21 at 11:45 AM EDT by a video enabled telemedicine application and verified that I am speaking with the correct person using two identifiers. ? Location patient: home ?Location provider:work or home office ?Persons participating in the virtual visit: patient, provider ? ?I discussed the limitations of evaluation and management by telemedicine and the availability of in person appointments. The patient expressed understanding and agreed to proceed. ? ? ?HPI: ?51 year old female who is being evaluated today for an acute issue.  Her symptoms started roughly 2 days ago.  Symptoms include itchy watery eyes, sneezing, rhinorrhea, sinus pressure, and headache.  She does feel as though some of her symptoms are better today than they were yesterday.  She has not had any fevers or chills.  At home she has been taking Mucinex DayQuil and an antihistamine. ? ? ?ROS: See pertinent positives and negatives per HPI. ? ?Past Medical History:  ?Diagnosis Date  ? Ectopic pregnancy   ? x 3  ? Essential hypertension   ? GERD (gastroesophageal reflux disease)   ? Hyperlipidemia   ? ? ?Past Surgical History:  ?Procedure Laterality Date  ? BIOPSY  01/06/2021  ? Procedure: BIOPSY;  Surgeon: Irving Copas., MD;  Location: [facility];  Service: Gastroenterology;;  ? COLONOSCOPY  06/2020  ? ECTOPIC PREGNANCY SURGERY    ? ENDOSCOPIC MUCOSAL RESECTION  08/14/2020  ? Procedure: ENDOSCOPIC MUCOSAL RESECTION;  Surgeon: Rush Landmark Telford Nab., MD;  Location: [place];  Service: Gastroenterology;;  ? ESOPHAGOGASTRODUODENOSCOPY N/A 01/06/2021  ? Procedure: ESOPHAGOGASTRODUODENOSCOPY (EGD);  Surgeon: Irving Copas., MD;  Location: [facility];  Service: Gastroenterology;  Laterality: N/A;  ? EUS N/A 08/14/2020  ? Procedure: LOWER ENDOSCOPIC ULTRASOUND (EUS);  Surgeon: Irving Copas., MD;  Location: [place];  Service: Gastroenterology;  Laterality:  N/A;  ? EUS N/A 01/06/2021  ? Procedure: UPPER ENDOSCOPIC ULTRASOUND (EUS) RADIAL;  Surgeon: Rush Landmark Telford Nab., MD;  Location: [facility];  Service: Gastroenterology;  Laterality: N/A;  ? FINE NEEDLE ASPIRATION  01/06/2021  ? Procedure: FINE NEEDLE ASPIRATION (FNA) LINEAR;  Surgeon: Irving Copas., MD;  Location: [facility];  Service: Gastroenterology;;  ? [place] N/A 08/14/2020  ? Procedure: FLEXIBLE SIGMOIDOSCOPY;  Surgeon: Irving Copas., MD;  Location: [place];  Service: Gastroenterology;  Laterality: N/A;  ? HEMOSTASIS CLIP PLACEMENT  08/14/2020  ? Procedure: HEMOSTASIS CLIP PLACEMENT;  Surgeon: Irving Copas., MD;  Location: [place];  Service: Gastroenterology;;  ? [place] INJECTION  08/14/2020  ? Procedure: SUBMUCOSAL LIFTING INJECTION;  Surgeon: Irving Copas., MD;  Location: [facility];  Service: Gastroenterology;;  ? [facility]  2008 & 2010  ? ? ?Family History  ?Problem Relation Age of Onset  ? Hypertension Mother   ? Hypertension Brother   ? Osteoarthritis Father   ? Breast cancer Other   ? Cancer Maternal Grandmother   ?     unknown  ? Cancer Maternal Grandfather   ?     unknown  ? Cancer Paternal Grandmother   ?     unknown  ? Cancer Paternal Grandfather   ?     unknowon  ? Colon cancer Neg Hx   ? Colon polyps Neg Hx   ? Esophageal cancer Neg Hx   ? Rectal cancer Neg Hx   ? Stomach cancer Neg Hx   ? Inflammatory bowel disease Neg Hx   ? Liver disease Neg Hx   ? Pancreatic  cancer Neg Hx   ? ? ? ? ? ?Current Outpatient Medications:  ?  azelastine (ASTELIN) 0.1 % nasal spray, Place 2 sprays into both nostrils 2 (two) times daily. Use in each nostril as directed, Disp: 30 mL, Rfl: 3 ?  citalopram (CELEXA) 40 MG tablet, Take 1 tablet (40 mg total) by mouth daily., Disp: 90 tablet, Rfl: 1 ?  Dulaglutide (TRULICITY) 6.06 TK/1.6WF SOPN, Inject 0.75 mg into the skin once a week., Disp: 6 mL, Rfl: 0 ?  ibuprofen (ADVIL) 200 MG  tablet, Take 400 mg by mouth every 8 (eight) hours as needed (pain)., Disp: , Rfl:  ?  losartan (COZAAR) 50 MG tablet, TAKE 1 TABLET BY MOUTH EVERY DAY, Disp: 90 tablet, Rfl: 3 ?  omeprazole (PRILOSEC) 20 MG capsule, TAKE 1 CAPSULE BY MOUTH EVERY DAY, Disp: 90 capsule, Rfl: 1 ?  ondansetron (ZOFRAN-ODT) 8 MG disintegrating tablet, Take 1 tablet (8 mg total) by mouth every 8 (eight) hours as needed for nausea or vomiting., Disp: 20 tablet, Rfl: 0 ?  rosuvastatin (CRESTOR) 10 MG tablet, Take 1 tablet (10 mg total) by mouth daily., Disp: 90 tablet, Rfl: 3 ?  temazepam (RESTORIL) 30 MG capsule, Take 1 capsule (30 mg total) by mouth at bedtime as needed for sleep., Disp: 30 capsule, Rfl: 2 ? ?EXAM: ? ?VITALS per patient if applicable: ? ?GENERAL: alert, oriented, appears well and in no acute distress ? ?HEENT: atraumatic, conjunttiva clear, no obvious abnormalities on inspection of external nose and ears ? ?NECK: normal movements of the head and neck ? ?LUNGS: on inspection no signs of respiratory distress, breathing rate appears normal, no obvious gross SOB, gasping or wheezing ? ?CV: no obvious cyanosis ? ?MS: moves all visible extremities without noticeable abnormality ? ?PSYCH/NEURO: pleasant and cooperative, no obvious depression or anxiety, speech and thought processing grossly intact ? ?ASSESSMENT AND PLAN: ? ?Discussed the following assessment and plan: ? ?1. Seasonal allergies ?-She was tested earlier today at our clinic for strep, COVID, and influenza.  All of these were negative. Her symptoms are more consistent with seasonal allergies. Will send in Astelin nasal spray  ?- POC Rapid Strep A ?- POC COVID-19 ?- POC Influenza A/B ?- azelastine (ASTELIN) 0.1 % nasal spray; Place 2 sprays into both nostrils 2 (two) times daily. Use in each nostril as directed  Dispense: 30 mL; Refill: 3 ? ? ?  ?I discussed the assessment and treatment plan with the patient. The patient was provided an opportunity to ask questions  and all were answered. The patient agreed with the plan and demonstrated an understanding of the instructions. ?  ?The patient was advised to call back or seek an in-person evaluation if the symptoms worsen or if the condition fails to improve as anticipated. ? ? ?Dorothyann Peng, NP  ? ?

## 2021-11-21 ENCOUNTER — Emergency Department (HOSPITAL_COMMUNITY): Payer: PRIVATE HEALTH INSURANCE

## 2021-11-21 ENCOUNTER — Emergency Department (HOSPITAL_COMMUNITY)
Admission: EM | Admit: 2021-11-21 | Discharge: 2021-11-22 | Disposition: A | Discharge status: Home / Self Care | Payer: PRIVATE HEALTH INSURANCE | Attending: Emergency Medicine | Admitting: Emergency Medicine

## 2021-11-21 DIAGNOSIS — S81002A Unspecified open wound, left knee, initial encounter: Secondary | ICD-10-CM

## 2021-11-21 DIAGNOSIS — W108XXA Fall (on) (from) other stairs and steps, initial encounter: Secondary | ICD-10-CM | POA: Diagnosis not present

## 2021-11-21 DIAGNOSIS — F10120 Alcohol abuse with intoxication, uncomplicated: Secondary | ICD-10-CM | POA: Insufficient documentation

## 2021-11-21 DIAGNOSIS — I10 Essential (primary) hypertension: Secondary | ICD-10-CM | POA: Insufficient documentation

## 2021-11-21 DIAGNOSIS — Z79899 Other long term (current) drug therapy: Secondary | ICD-10-CM | POA: Insufficient documentation

## 2021-11-21 DIAGNOSIS — F10920 Alcohol use, unspecified with intoxication, uncomplicated: Secondary | ICD-10-CM

## 2021-11-21 DIAGNOSIS — W19XXXA Unspecified fall, initial encounter: Secondary | ICD-10-CM

## 2021-11-21 DIAGNOSIS — S8992XA Unspecified injury of left lower leg, initial encounter: Secondary | ICD-10-CM | POA: Diagnosis present

## 2021-11-21 MED ORDER — LIDOCAINE-EPINEPHRINE (PF) 2 %-1:200000 IJ SOLN
20.0000 mL | Freq: Once | INTRAMUSCULAR | Status: AC
  Administered 2021-11-22: 20 mL via INTRADERMAL
  Filled 2021-11-21: qty 20

## 2021-11-21 NOTE — ED Provider Notes (Signed)
?[facility] ?Provider Note ? ?CSN: [id number] ?Arrival date & time: 11/21/21 2258 ? ?Chief Complaint(s) ?Fall and Alcohol Intoxication ? ?HPI ?Holly Horn is a 51 y.o. female who presents after mechanical fall while intoxicated.  Patient missed a step while walking on the sidewalk causing her to fall onto concrete.  She hit her left knee and left elbow.  Endorsing mostly left knee pain.  Fall resulted in wound over the left kneecap prompting her visit to the ED.  She denied any head trauma or loss of consciousness.  Denies any headache, neck pain, chest pain, back pain, abdominal pain, hip pain or other extremity pain. ? ?The history is provided by the patient.  ? ?Past Medical History ?Past Medical History:  ?Diagnosis Date  ? Ectopic pregnancy   ? x 3  ? Essential hypertension   ? GERD (gastroesophageal reflux disease)   ? Hyperlipidemia   ? ?Patient Active Problem List  ? Diagnosis Date Noted  ? 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 Elevated chromogranin A 10/07/2020  ? Hx of adenomatous colonic polyps 10/07/2020  ? Gastroesophageal reflux disease 10/07/2020  ? GERD (gastroesophageal reflux disease) 03/01/2018  ? Essential hypertension 07/21/2016  ? Anxiety and depression 07/21/2015  ? OBESITY 03/15/2008  ? TOBACCO ABUSE 09/28/2007  ? EXTRINSIC ASTHMA, UNSPECIFIED 09/26/2007  ? CHEST PAIN 07/03/2007  ? HEADACHE 04/03/2007  ? ?Home Medication(s) ?Prior to Admission medications   ?Medication Sig Start Date End Date Taking? Authorizing Provider  ?azelastine (ASTELIN) 0.1 % nasal spray Place 2 sprays into both nostrils 2 (two) times daily. Use in each nostril as directed ?Patient taking differently: Place 2 sprays into both nostrils 2 (two) times daily as needed for rhinitis or allergies. 11/06/21  Yes Nafziger, Tommi Rumps, NP  ?citalopram (CELEXA) 40 MG tablet Take 1 tablet (40 mg total) by mouth daily. 07/29/21  Yes Dorothyann Peng, NP  ?Dulaglutide (TRULICITY) 5.02  DX/4.1OI SOPN Inject 0.75 mg into the skin once a week. ?Patient taking differently: Inject 0.75 mg into the skin every Monday. 10/06/21 01/04/22 Yes Nafziger, Tommi Rumps, NP  ?ibuprofen (ADVIL) 200 MG tablet Take 400 mg by mouth every 8 (eight) hours as needed (pain).   Yes [provider]  ?losartan (COZAAR) 50 MG tablet TAKE 1 TABLET BY MOUTH EVERY DAY ?Patient taking differently: Take 50 mg by mouth daily. 06/01/21  Yes Nafziger, Tommi Rumps, NP  ?omeprazole (PRILOSEC) 20 MG capsule TAKE 1 CAPSULE BY MOUTH EVERY DAY ?Patient taking differently: Take 20 mg by mouth daily. 07/30/21  Yes Nafziger, Tommi Rumps, NP  ?ondansetron (ZOFRAN-ODT) 8 MG disintegrating tablet Take 1 tablet (8 mg total) by mouth every 8 (eight) hours as needed for nausea or vomiting. 10/22/21  Yes Mansouraty, Telford Nab., MD  ?rosuvastatin (CRESTOR) 10 MG tablet Take 1 tablet (10 mg total) by mouth daily. 07/29/21  Yes Nafziger, Tommi Rumps, NP  ?temazepam (RESTORIL) 30 MG capsule Take 1 capsule (30 mg total) by mouth at bedtime as needed for sleep. 07/29/21  Yes Nafziger, Tommi Rumps, NP  ?                                                                                                                                  ?  Allergies ?Morphine sulfate, Lisinopril, and Progesterone ? ?Review of Systems ?Review of Systems ?As noted in HPI ? ?Physical Exam ?Vital Signs  ?I have reviewed the triage vital signs ?BP (!) 128/95 (BP Location: Right Arm)   Pulse 85   Resp 20   LMP 08/23/2013   SpO2 92%  ? ?Physical Exam ?Constitutional:   ?   General: She is not in acute distress. ?   Appearance: She is well-developed. She is not diaphoretic.  ?HENT:  ?   Head: Normocephalic and atraumatic.  ?   Right Ear: External ear normal.  ?   Left Ear: External ear normal.  ?   Nose: Nose normal.  ?Eyes:  ?   General: No scleral icterus.    ?   Right eye: No discharge.     ?   Left eye: No discharge.  ?   Conjunctiva/sclera: Conjunctivae normal.  ?   Pupils: Pupils are equal, round, and  reactive to light.  ?Cardiovascular:  ?   Rate and Rhythm: Normal rate and regular rhythm.  ?   Pulses:     ?     Radial pulses are 2+ on the right side and 2+ on the left side.  ?     Dorsalis pedis pulses are 2+ on the right side and 2+ on the left side.  ?   Heart sounds: Normal heart sounds. No murmur heard. ?  No friction rub. No gallop.  ?Pulmonary:  ?   Effort: Pulmonary effort is normal. No respiratory distress.  ?   Breath sounds: Normal breath sounds. No stridor. No wheezing.  ?Abdominal:  ?   General: There is no distension.  ?   Palpations: Abdomen is soft.  ?   Tenderness: There is no abdominal tenderness.  ?Musculoskeletal:     ?   General: No tenderness.  ?   Cervical back: Normal range of motion and neck supple. No bony tenderness.  ?   Thoracic back: No bony tenderness.  ?   Lumbar back: No bony tenderness.  ?   Comments: Clavicles stable. ?Chest stable to AP/Lat compression. ?Pelvis stable to Lat compression. ?No obvious extremity deformity. ?No chest or abdominal wall contusion.  ?Skin: ?   General: Skin is warm and dry.  ?   Findings: Abrasion present. No erythema or rash.  ? ?    ?Neurological:  ?   Mental Status: She is alert and oriented to person, place, and time.  ?   Comments: Moving all extremities  ? ? ?ED Results and Treatments ?Labs ?(all labs ordered are listed, but only abnormal results are displayed) ?Labs Reviewed - No data to display                                                                                                                       ?EKG ? EKG Interpretation ? ?Date/Time:    ?Ventricular Rate:    ?PR Interval:    ?QRS Duration:   ?QT  Interval:    ?QTC Calculation:   ?R Axis:     ?Text Interpretation:   ?  ? ?  ? ?Radiology ?DG Elbow 2 Views Left ? ?Result Date: 11/21/2021 ?CLINICAL DATA:  Fall, laceration to left elbow. EXAM: LEFT ELBOW - 2 VIEW COMPARISON:  None. FINDINGS: There is no evidence of fracture, dislocation, or joint effusion. There is no evidence of  arthropathy or other focal bone abnormality. Soft tissues are unremarkable. IMPRESSION: Negative. Electronically Signed   By: Brett Fairy M.D.   On: 11/21/2021 23:52  ? ?DG Knee AP/LAT W/Sunrise Left ? ?Result Date: 11/21/2021 ?CLINICAL DATA:  Fall, laceration to left knee. EXAM: LEFT KNEE 3 VIEWS COMPARISON:  05/01/2018. FINDINGS: No evidence of fracture, dislocation, or joint effusion. Mild joint space narrowing is noted in the medial compartment. Soft tissue swelling is present anterior to the patella. IMPRESSION: No acute fracture or dislocation. Electronically Signed   By: Brett Fairy M.D.   On: 11/21/2021 23:54   ? ?Pertinent labs & imaging results that were available during my care of the patient were reviewed by me and considered in my medical decision making (see MDM for details). ? ?Medications Ordered in ED ?Medications  ?lidocaine-EPINEPHrine (XYLOCAINE W/EPI) 2 %-1:200000 (PF) injection 20 mL (20 mLs Intradermal Given by Other 11/22/21 0712)  ?                                                               ?                                                                    ?Procedures ?Marland Kitchen.Laceration Repair ? ?Date/Time: 11/22/2021 2:50 AM ?Performed by: Fatima Blank, MD ?Authorized by: Fatima Blank, MD  ? ?Consent:  ?  Consent obtained:  Verbal ?  Consent given by:  Patient ?  Risks discussed:  Infection, poor cosmetic result, pain and poor wound healing ?  Alternatives discussed:  No treatment ?Universal protocol:  ?  Procedure explained and questions answered to patient or proxy's satisfaction: yes   ?  Imaging studies available: yes   ?  Patient identity confirmed:  Arm band and verbally with patient ?Anesthesia:  ?  Anesthesia method:  Local infiltration ?  Local anesthetic:  Lidocaine 2% WITH epi ?Laceration details:  ?  Location:  Leg ?  Leg location:  L knee ?  Length (cm):  2.5 ?  Depth (mm):  10 ?Pre-procedure details:  ?  Preparation:  Patient was prepped and draped in  usual sterile fashion and imaging obtained to evaluate for foreign bodies ?Exploration:  ?  Limited defect created (wound extended): yes (oval incision created)   ?  Hemostasis achieved with:  Direct pressure ?  Ima

## 2021-11-21 NOTE — ED Triage Notes (Addendum)
Pt BIB EMS after a witnessed fall. Pt missed a step on sidewalk. Patient has full ROM and bystanders say that pt did not hit her head and did not have LOC. ETOH is on board. ? ?VSS w/ EMS. ?

## 2021-11-22 NOTE — Discharge Instructions (Addendum)
Do not let your laceration (cut) get wet for the next 48 hours. After that you may allow soapy water to drain down the wound to clean it. Please do not scrub.  ?Do not submerge the wound in water for 2 weeks. ?To minimize scarring, you can apply a vaseline based ointment for the next 2 weeks and keep it out of direct sun light. After that, you may apply sunscreen for the next several months. ?Your stitches will to be removed in 14 days.  ?Return if your wound appears to be infected (see laceration care instructions). ? ?For pain control you may take at 1000 mg of Tylenol every 8 hours as needed. ?

## 2021-11-22 NOTE — ED Notes (Signed)
Discharge instructions reviewed with patient and family. Patient and family verbalized understanding of instructions. Follow-up care and medications were reviewed. Patient ambulatory with steady gait. VSS upon discharge.  ?

## 2021-11-22 NOTE — Progress Notes (Signed)
Orthopedic Tech Progress Note ?Patient Details:  ?Katilyn LAN ENTSMINGER ?15-Sep-1970 ?[id number] ? ?Ortho Devices ?Type of Ortho Device: Crutches, Knee Immobilizer ?Ortho Device/Splint Location: lle ?Ortho Device/Splint Interventions: Ordered, Application, Adjustment ?  ?Post Interventions ?Patient Tolerated: Well ?Instructions Provided: Care of device, Adjustment of device ? ?Karolee Stamps ?11/22/2021, 3:00 AM ? ?

## 2021-11-24 ENCOUNTER — Other Ambulatory Visit: Payer: Self-pay | Admitting: Adult Health

## 2021-11-24 ENCOUNTER — Ambulatory Visit: Payer: No Typology Code available for payment source | Admitting: Gastroenterology

## 2021-11-24 ENCOUNTER — Telehealth: Payer: Self-pay | Admitting: Gastroenterology

## 2021-11-24 MED ORDER — TRULICITY 1.5 MG/0.5ML ~~LOC~~ SOAJ: 1.5000 mg | SUBCUTANEOUS | 2 refills | Status: AC

## 2021-11-24 NOTE — Telephone Encounter (Signed)
I am sorry to hear this. ?She can reschedule as needed. ?Thanks. ?GM ?

## 2021-11-24 NOTE — Telephone Encounter (Signed)
Good Morning Dr. Rush Landmark, ? ?Patient called to cancel her appointment with you today at 2:50 due to falling over the weekend and hurting herself. ? ?Patient was rescheduled for 5/30 at 2:50 ?

## 2021-12-04 ENCOUNTER — Ambulatory Visit (INDEPENDENT_AMBULATORY_CARE_PROVIDER_SITE_OTHER): Payer: No Typology Code available for payment source

## 2021-12-04 DIAGNOSIS — Z23 Encounter for immunization: Secondary | ICD-10-CM

## 2021-12-09 ENCOUNTER — Ambulatory Visit (INDEPENDENT_AMBULATORY_CARE_PROVIDER_SITE_OTHER): Payer: No Typology Code available for payment source | Admitting: Adult Health

## 2021-12-09 ENCOUNTER — Encounter: Payer: Self-pay | Admitting: Adult Health

## 2021-12-09 VITALS — BP 130/70 | HR 80 | Temp 98.6°F | Ht 67.0 in | Wt 249.0 lb

## 2021-12-09 DIAGNOSIS — Z4802 Encounter for removal of sutures: Secondary | ICD-10-CM

## 2021-12-09 DIAGNOSIS — M25562 Pain in left knee: Secondary | ICD-10-CM | POA: Diagnosis not present

## 2021-12-09 NOTE — Progress Notes (Signed)
? ?Subjective:  ? ? Patient ID: Holly Horn, female    DOB: Jan 01, 1971, 51 y.o.   MRN: [id number] ? ?HPI ?51 year old female who  has a past medical history of Ectopic pregnancy, Essential hypertension, GERD (gastroesophageal reflux disease), and Hyperlipidemia. ? ?She was seen in the emergency room on 11/21/2021 for mechanical fall while intoxicated.  She reported that she missed a step while walking on the sidewalk causing her to fall onto concrete.  She hit her left knee and left elbow.  She had a wound over her left kneecap ? ?Her work-up was unremarkable except for a deep wound over her left kneecap in which sutures were placed.  She is here for suture removal ? ?Today she reports that she continues to have pain around the out outside of her left knee but is able to bear weight. She has not noticed any signs of infection from the wound  ? ? ?Review of Systems ?See HPI  ? ?Past Medical History:  ?Diagnosis Date  ? Ectopic pregnancy   ? x 3  ? Essential hypertension   ? GERD (gastroesophageal reflux disease)   ? Hyperlipidemia   ? ? ?Social History  ? ?Socioeconomic History  ? Marital status: Significant Other  ?  Spouse name: Not on file  ? Number of children: Not on file  ? Years of education: Not on file  ? Highest education level: Bachelor's degree (e.g., BA, AB, BS)  ?Occupational History  ? Not on file  ?Tobacco Use  ? Smoking status: Some Days  ?  Types: Cigarettes  ? Smokeless tobacco: Never  ? Tobacco comments:  ?  socially  ?Vaping Use  ? Vaping Use: Never used  ?Substance and Sexual Activity  ? Alcohol use: Yes  ?  Alcohol/week: 12.0 standard drinks  ?  Types: 12 Standard drinks or equivalent per week  ? Drug use: No  ? Sexual activity: Not on file  ?Other Topics Concern  ? Not on file  ?Social History Narrative  ? Not on file  ? ?Social Determinants of Health  ? ?Financial Resource Strain: Low Risk   ? Difficulty of Paying Living Expenses: Not very hard  ?Food Insecurity: No Food Insecurity  ?  Worried About Charity fundraiser in the Last Year: Never true  ? Ran Out of Food in the Last Year: Never true  ?Transportation Needs: No Transportation Needs  ? Lack of Transportation (Medical): No  ? Lack of Transportation (Non-Medical): No  ?Physical Activity: Insufficiently Active  ? Days of Exercise per Week: 2 days  ? Minutes of Exercise per Session: 10 min  ?Stress: Stress Concern Present  ? Feeling of Stress : Rather much  ?Social Connections: Moderately Isolated  ? Frequency of Communication with Friends and Family: Three times a week  ? Frequency of Social Gatherings with Friends and Family: Never  ? Attends Religious Services: Never  ? Active Member of Clubs or Organizations: No  ? Attends Archivist Meetings: Not on file  ? Marital Status: Living with partner  ?Intimate Partner Violence: Not on file  ? ? ?Past Surgical History:  ?Procedure Laterality Date  ? BIOPSY  01/06/2021  ? Procedure: BIOPSY;  Surgeon: Irving Copas., MD;  Location: [facility];  Service: Gastroenterology;;  ? COLONOSCOPY  06/2020  ? ECTOPIC PREGNANCY SURGERY    ? ENDOSCOPIC MUCOSAL RESECTION  08/14/2020  ? Procedure: ENDOSCOPIC MUCOSAL RESECTION;  Surgeon: Mansouraty, Telford Nab., MD;  Location: [facility];  Service: Gastroenterology;;  ? ESOPHAGOGASTRODUODENOSCOPY N/A 01/06/2021  ? Procedure: ESOPHAGOGASTRODUODENOSCOPY (EGD);  Surgeon: Irving Copas., MD;  Location: [facility];  Service: Gastroenterology;  Laterality: N/A;  ? EUS N/A 08/14/2020  ? Procedure: LOWER ENDOSCOPIC ULTRASOUND (EUS);  Surgeon: Irving Copas., MD;  Location: [facility];  Service: Gastroenterology;  Laterality: N/A;  ? EUS N/A 01/06/2021  ? Procedure: UPPER ENDOSCOPIC ULTRASOUND (EUS) RADIAL;  Surgeon: Rush Landmark Telford Nab., MD;  Location: [facility];  Service: Gastroenterology;  Laterality: N/A;  ? FINE NEEDLE ASPIRATION  01/06/2021  ? Procedure: FINE NEEDLE ASPIRATION (FNA) LINEAR;  Surgeon: Irving Copas., MD;  Location: [facility];  Service: Gastroenterology;;  ? [place] N/A 08/14/2020  ? Procedure: FLEXIBLE SIGMOIDOSCOPY;  Surgeon: Irving Copas., MD;  Location: [facility];  Service: Gastroenterology;  Laterality: N/A;  ? HEMOSTASIS CLIP PLACEMENT  08/14/2020  ? Procedure: HEMOSTASIS CLIP PLACEMENT;  Surgeon: Irving Copas., MD;  Location: [facility];  Service: Gastroenterology;;  ? [place] INJECTION  08/14/2020  ? Procedure: SUBMUCOSAL LIFTING INJECTION;  Surgeon: Irving Copas., MD;  Location: [facility];  Service: Gastroenterology;;  ? [facility]  2008 & 2010  ? ? ?Family History  ?Problem Relation Age of Onset  ? Hypertension Mother   ? Hypertension Brother   ? Osteoarthritis Father   ? Breast cancer Other   ? Cancer Maternal Grandmother   ?     unknown  ? Cancer Maternal Grandfather   ?     unknown  ? Cancer Paternal Grandmother   ?     unknown  ? Cancer Paternal Grandfather   ?     unknowon  ? Colon cancer Neg Hx   ? Colon polyps Neg Hx   ? Esophageal cancer Neg Hx   ? Rectal cancer Neg Hx   ? Stomach cancer Neg Hx   ? Inflammatory bowel disease Neg Hx   ? Liver disease Neg Hx   ? Pancreatic cancer Neg Hx   ? ? ?Allergies  ?Allergen Reactions  ? Morphine Sulfate Shortness Of Breath and Nausea And Vomiting  ? Lisinopril Cough  ? Progesterone Nausea Only  ? ? ?Current Outpatient Medications on File Prior to Visit  ?Medication Sig Dispense Refill  ? azelastine (ASTELIN) 0.1 % nasal spray Place 2 sprays into both nostrils 2 (two) times daily. Use in each nostril as directed (Patient taking differently: Place 2 sprays into both nostrils 2 (two) times daily as needed for rhinitis or allergies.) 30 mL 3  ? citalopram (CELEXA) 40 MG tablet Take 1 tablet (40 mg total) by mouth daily. 90 tablet 1  ? Dulaglutide (TRULICITY) 1.5 CB/4.4HQ SOPN Inject 1.5 mg into the skin once a week. 2 mL 2  ? ibuprofen (ADVIL) 200 MG tablet Take 400 mg by mouth every 8  (eight) hours as needed (pain).    ? losartan (COZAAR) 50 MG tablet TAKE 1 TABLET BY MOUTH EVERY DAY (Patient taking differently: Take 50 mg by mouth daily.) 90 tablet 3  ? omeprazole (PRILOSEC) 20 MG capsule TAKE 1 CAPSULE BY MOUTH EVERY DAY (Patient taking differently: Take 20 mg by mouth daily.) 90 capsule 1  ? ondansetron (ZOFRAN-ODT) 8 MG disintegrating tablet Take 1 tablet (8 mg total) by mouth every 8 (eight) hours as needed for nausea or vomiting. 20 tablet 0  ? rosuvastatin (CRESTOR) 10 MG tablet Take 1 tablet (10 mg total) by mouth daily. 90 tablet 3  ? temazepam (RESTORIL) 30 MG capsule Take  1 capsule (30 mg total) by mouth at bedtime as needed for sleep. 30 capsule 2  ? ?No current facility-administered medications on file prior to visit.  ? ? ?BP 130/70   Pulse 80   Temp 98.6 ?F (37 ?C) (Oral)   Ht '5\' 7"'$  (1.702 m)   Wt 249 lb (112.9 kg)   LMP 08/23/2013   SpO2 97%   BMI 39.00 kg/m?  ? ? ?   ?Objective:  ? Physical Exam ?Vitals and nursing note reviewed.  ?Constitutional:   ?   Appearance: Normal appearance.  ?Musculoskeletal:     ?   General: Normal range of motion.  ?   Left knee: No swelling, bony tenderness or crepitus. Normal range of motion. Tenderness present over the LCL and patellar tendon. No medial joint line tenderness. No LCL laxity, MCL laxity, ACL laxity or PCL laxity.Normal alignment, normal meniscus and normal patellar mobility. Normal pulse.  ?Skin: ?   General: Skin is warm and dry.  ?   Comments: Well healed wound on left knee. Three sutures in place. No sighs of infection  ?  ?Neurological:  ?   General: No focal deficit present.  ?   Mental Status: She is alert and oriented to person, place, and time.  ?Psychiatric:     ?   Mood and Affect: Mood normal.     ?   Behavior: Behavior normal.     ?   Thought Content: Thought content normal.     ?   Judgment: Judgment normal.  ? ?   ?Assessment & Plan:  ?1. Acute pain of left knee ?- Seems to be soft tissue injury from fall. No  signs of torn tendon or ligament. Advised ice as much as possibly. If not resolved/resolving in the next 4 weeks then can consider MRI of left knee ? ?2. Visit for suture removal ?- Verbal consent obtained. Three sutu

## 2021-12-11 ENCOUNTER — Other Ambulatory Visit: Payer: Self-pay | Admitting: Adult Health

## 2021-12-11 DIAGNOSIS — F5101 Primary insomnia: Secondary | ICD-10-CM

## 2021-12-11 NOTE — Telephone Encounter (Signed)
Okay for refill?  ? ? ?LOV 12/09/2021 ? ?Last refill ? ?temazepam (RESTORIL) 30 MG capsule 30 capsule 2 07/29/2021   ?Sig:   Take 1 capsule (30 mg total) by mouth at bedtime as needed for sleep.      ? ?

## 2022-01-05 ENCOUNTER — Other Ambulatory Visit: Payer: No Typology Code available for payment source

## 2022-01-05 ENCOUNTER — Encounter: Payer: Self-pay | Admitting: Gastroenterology

## 2022-01-05 ENCOUNTER — Ambulatory Visit: Payer: No Typology Code available for payment source | Admitting: Gastroenterology

## 2022-01-05 VITALS — BP 144/100 | HR 67 | Ht 67.0 in | Wt 275.0 lb

## 2022-01-05 DIAGNOSIS — R110 Nausea: Secondary | ICD-10-CM

## 2022-01-05 DIAGNOSIS — Z8619 Personal history of other infectious and parasitic diseases: Secondary | ICD-10-CM

## 2022-01-05 DIAGNOSIS — K219 Gastro-esophageal reflux disease without esophagitis: Secondary | ICD-10-CM

## 2022-01-05 DIAGNOSIS — R899 Unspecified abnormal finding in specimens from other organs, systems and tissues: Secondary | ICD-10-CM

## 2022-01-05 DIAGNOSIS — D3A026 Benign carcinoid tumor of the rectum: Secondary | ICD-10-CM | POA: Diagnosis not present

## 2022-01-05 DIAGNOSIS — R195 Other fecal abnormalities: Secondary | ICD-10-CM | POA: Diagnosis not present

## 2022-01-05 DIAGNOSIS — K580 Irritable bowel syndrome with diarrhea: Secondary | ICD-10-CM

## 2022-01-05 NOTE — Patient Instructions (Addendum)
Your provider has requested that you go to the basement level for lab work in 10 days.  Press "B" on the elevator. The lab is located at the first door on the left as you exit the elevator.  You have been scheduled for an endoscopy. Please follow written instructions given to you at your visit today. If you use inhalers (even only as needed), please bring them with you on the day of your procedure.  Stop your omeprazole for 10 days, then come in and have labs done.   If you are age 6 or older, your body mass index should be between 23-30. Your Body mass index is 43.07 kg/m. If this is out of the aforementioned range listed, please consider follow up with your Primary Care Provider.  If you are age 2 or younger, your body mass index should be between 19-25. Your Body mass index is 43.07 kg/m. If this is out of the aformentioned range listed, please consider follow up with your Primary Care Provider.   ________________________________________________________  The [facility] providers would like to encourage you to use [facility] to communicate with providers for non-urgent requests or questions.  Due to long hold times on the telephone, sending your provider a message by [facility] may be a faster and more efficient way to get a response.  Please allow 48 business hours for a response.  Please remember that this is for non-urgent requests.  _______________________________________________________  Thank you for choosing me and [facility].  Dr. Rush Landmark

## 2022-01-05 NOTE — Progress Notes (Unsigned)
GASTROENTEROLOGY OUTPATIENT CLINIC VISIT   Primary Care Provider Shirline Frees, NP [address] [place] [address] [phone]  Patient Profile: Holly Horn is a 51 y.o. 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  The patient presents to the [facility] for an evaluation and management of problem(s) noted below:  Problem List 1. Carcinoid tumor of rectum, unspecified whether malignant   2. Chronic nausea   3. History of Helicobacter infection   4. Loose stools   5. Irritable bowel syndrome with diarrhea   6. Elevated chromogranin A      History of Present Illness This is a patient I met in November 2021 for a direct screening colonoscopy.  6 polyps were removed with 1 polyp having a slightly differentiated in the rectum.  5 tubular adenomas were noted and 1 neuroendocrine tumor which was attributed to the rectal polyp.  The resection site had evidence of neuroendocrine tumor going to the base of the lesion.  We obtained a chromogranin A level after her initial colonoscopy which was elevated at 343.3 ng/mL.  She had been on PPI therapy at the time due to a history of GERD.  This was then followed up with a CT abdomen/pelvis with results as below but no evidence of any pelvic lymphadenopathy or abdominal lymphadenopathy.  So, in January 2022 a lower EUS and EMR was performed of the rectal scar, no evidence of persistent neuroendocrine tumor was found at that time.  The plan at the time was for the patient to follow-up and then discuss long-term follow-up.  Today, the patient is doing well.  She does have some concerns about being off PPI therapy due to her GERD symptoms but is willing to do whatever it takes.  Patient is not taking significant nonsteroidals or BC/Goody powders.  Patient has never had an EGD.  GI Review of  Systems Positive as above Negative for dysphagia, odynophagia, nausea, vomiting, early satiety, abdominal pain, change in bowel habits, melena, hematochezia  Review of Systems General: Denies fevers/chills/weight loss unintentionally Cardiovascular: Denies chest pain/palpitations Pulmonary: Denies shortness of breath Gastroenterological: See HPI Genitourinary: Denies darkened urine Hematological: Denies easy bruising/bleeding Dermatological: Denies jaundice Psychological: Mood is stable   Medications Current Outpatient Medications  Medication Sig Dispense Refill   citalopram (CELEXA) 40 MG tablet Take 1 tablet (40 mg total) by mouth daily. 90 tablet 1   Dulaglutide (TRULICITY) 1.5 MG/0.5ML SOPN Inject 1.5 mg into the skin once a week. 2 mL 2   ibuprofen (ADVIL) 200 MG tablet Take 400 mg by mouth every 8 (eight) hours as needed (pain).     losartan (COZAAR) 50 MG tablet TAKE 1 TABLET BY MOUTH EVERY DAY (Patient taking differently: Take 50 mg by mouth daily.) 90 tablet 3   omeprazole (PRILOSEC) 20 MG capsule TAKE 1 CAPSULE BY MOUTH EVERY DAY (Patient taking differently: Take 20 mg by mouth daily.) 90 capsule 1   rosuvastatin (CRESTOR) 10 MG tablet Take 1 tablet (10 mg total) by mouth daily. 90 tablet 3   temazepam (RESTORIL) 30 MG capsule TAKE 1 CAPSULE BY MOUTH AT BEDTIME AS NEEDED FOR SLEEP. 30 capsule 2   ondansetron (ZOFRAN-ODT) 8 MG disintegrating tablet Take 1 tablet (8 mg total) by mouth every 8 (eight) hours as needed for nausea or vomiting. (Patient not taking: Reported on 01/05/2022) 20 tablet 0   No current facility-administered medications for this visit.  Allergies Allergies  Allergen Reactions   Morphine Sulfate Shortness Of Breath and Nausea And Vomiting   Lisinopril Cough   Progesterone Nausea Only    Histories Past Medical History:  Diagnosis Date   Ectopic pregnancy    x 3   Essential hypertension    GERD (gastroesophageal reflux disease)     Hyperlipidemia    Past Surgical History:  Procedure Laterality Date   BIOPSY  01/06/2021   Procedure: BIOPSY;  Surgeon: Irving Copas., MD;  Location: [facility];  Service: Gastroenterology;;   COLONOSCOPY  06/2020   ECTOPIC PREGNANCY SURGERY     ENDOSCOPIC MUCOSAL RESECTION  08/14/2020   Procedure: ENDOSCOPIC MUCOSAL RESECTION;  Surgeon: Irving Copas., MD;  Location: [facility];  Service: Gastroenterology;;   ESOPHAGOGASTRODUODENOSCOPY N/A 01/06/2021   Procedure: ESOPHAGOGASTRODUODENOSCOPY (EGD);  Surgeon: Irving Copas., MD;  Location: [facility];  Service: Gastroenterology;  Laterality: N/A;   EUS N/A 08/14/2020   Procedure: LOWER ENDOSCOPIC ULTRASOUND (EUS);  Surgeon: Irving Copas., MD;  Location: [place];  Service: Gastroenterology;  Laterality: N/A;   EUS N/A 01/06/2021   Procedure: UPPER ENDOSCOPIC ULTRASOUND (EUS) RADIAL;  Surgeon: Irving Copas., MD;  Location: [facility];  Service: Gastroenterology;  Laterality: N/A;   FINE NEEDLE ASPIRATION  01/06/2021   Procedure: FINE NEEDLE ASPIRATION (FNA) LINEAR;  Surgeon: Irving Copas., MD;  Location: [facility];  Service: Gastroenterology;;   Otho Darner SIGMOIDOSCOPY N/A 08/14/2020   Procedure: Beryle Quant;  Surgeon: Irving Copas., MD;  Location: [place];  Service: Gastroenterology;  Laterality: N/A;   HEMOSTASIS CLIP PLACEMENT  08/14/2020   Procedure: HEMOSTASIS CLIP PLACEMENT;  Surgeon: Irving Copas., MD;  Location: [place];  Service: Gastroenterology;;   SUBMUCOSAL LIFTING INJECTION  08/14/2020   Procedure: SUBMUCOSAL LIFTING INJECTION;  Surgeon: Irving Copas., MD;  Location: [facility]COPY;  Service: Gastroenterology;;   Christy Gentles Vein Surgery  2008 & 2010   Social History   Socioeconomic History   Marital status: Significant Other    Spouse name: Not on file   Number of children: Not on file   Years of education: Not on file    Highest education level: Bachelor's degree (e.g., BA, AB, BS)  Occupational History   Not on file  Tobacco Use   Smoking status: Some Days    Types: Cigarettes   Smokeless tobacco: Never   Tobacco comments:    socially  Vaping Use   Vaping Use: Never used  Substance and Sexual Activity   Alcohol use: Yes    Alcohol/week: 12.0 standard drinks    Types: 12 Standard drinks or equivalent per week   Drug use: No   Sexual activity: Not on file  Other Topics Concern   Not on file  Social History Narrative   Not on file   Social Determinants of Health   Financial Resource Strain: Low Risk    Difficulty of Paying Living Expenses: Not very hard  Food Insecurity: No Food Insecurity   Worried About Running Out of Food in the Last Year: Never true   Ran Out of Food in the Last Year: Never true  Transportation Needs: No Transportation Needs   Lack of Transportation (Medical): No   Lack of Transportation (Non-Medical): No  Physical Activity: Insufficiently Active   Days of Exercise per Week: 2 days   Minutes of Exercise per Session: 10 min  Stress: Stress Concern Present   Feeling of Stress : Rather much  Social Connections: Moderately Isolated  Frequency of Communication with Friends and Family: Three times a week   Frequency of Social Gatherings with Friends and Family: Never   Attends Religious Services: Never   Marine scientist or Organizations: No   Attends Music therapist: Not on file   Marital Status: Living with partner  Intimate Partner Violence: Not on file   Family History  Problem Relation Age of Onset   Hypertension Mother    Osteoarthritis Father    Hypertension Brother    Cancer Maternal Grandmother        unknown   Cancer Maternal Grandfather        unknown   Cancer Paternal Grandmother        unknown   Cancer Paternal Grandfather        unknowon   Breast cancer Other    Colon cancer Neg Hx    Colon polyps Neg Hx    Esophageal  cancer Neg Hx    Rectal cancer Neg Hx    Stomach cancer Neg Hx    Inflammatory bowel disease Neg Hx    Liver disease Neg Hx    Pancreatic cancer Neg Hx    I have reviewed her medical, social, and family history in detail and updated the electronic medical record as necessary.    PHYSICAL EXAMINATION  BP (!) 144/100   Pulse 67   Ht $R'5\' 7"'UO$  (1.702 m)   Wt 275 lb (124.7 kg)   LMP 08/23/2013   BMI 43.07 kg/m  Wt Readings from Last 3 Encounters:  01/05/22 275 lb (124.7 kg)  12/09/21 249 lb (112.9 kg)  11/06/21 279 lb (126.6 kg)  GEN: NAD, appears stated age, doesn't appear chronically ill PSYCH: Cooperative, without pressured speech EYE: Conjunctivae pink, sclerae anicteric ENT: Masked CV: Nontachycardic RESP: No audible wheezing GI: NABS, soft, protuberant abdomen, rounded, obese, nontender, without rebound MSK/EXT: Lower extremity edema present SKIN: No jaundice NEURO:  Alert & Oriented x 3, no focal deficits   REVIEW OF DATA  I reviewed the following data at the time of this encounter:  GI Procedures and Studies  May 2022 EUS EGD Impression: - No gross lesions in esophagus. Biopsied for EoE. - Z-line irregular, 36 cm from the incisors. - 2 cm hiatal hernia. - Erythematous mucosa in the antrum. No other gross lesions in the stomach. Biopsied. - No gross lesions in the duodenal bulb, in the first portion of the duodenum and in the second portion of the duodenum. - Normal major papilla. EUS Impression: - Pancreatic parenchymal abnormalities consisting of diffuse echogenicity, lobularity and hyperechoic strands were noted in the entire pancreas. These do not meet criteria for EUS diagnosis of Chronic Pancreatitis but are suggestive. - The pancreatic duct had a normal endosonographic appearance in the pancreatic head, genu of the pancreas, body of the pancreas and tail of the pancreas. - There was no sign of significant pathology in the common bile duct and in the  common hepatic duct. - A peripancreatic tail lesion vs lymph node was identified. This did not have the appearance of origination from within the pancreas but was rather intimately close to the spleen. Due to the positive DOTATATE scan in this region (even though no mass noted on MRI or today's EUS in the pancreas) FNB attempted today of the small lesion. Cytology results are pending.  Pathology Specimen Submitted:  A. PANCREATIC TAIL LESION, FINE NEEDLE ASPIRATION:    FINAL MICROSCOPIC DIAGNOSIS:  - No malignant cells identified  -  Reactive glandular cells   January 2022 lower EUS Flexible Sigmoidoscopy Impression: - Hemorrhoids found on digital rectal exam. - Stool in the entire examined colon. Lavaged with adequate visualization. - Diverticulosis in the recto-sigmoid colon and in the sigmoid colon. - Post-polypectomy scar in the mid rectum. After EUS, mucosal resection performed of scar (via Cap/Band/Snare technique) to ensure complete removal of NET. Clips (MR conditional) were placed to decrease risk of bleeding. - Non-bleeding non-thrombosed external and internal hemorrhoids. EUS Impression: - Wall thickening was visualized endosonographically in the rectum. This was due to increased thickness of the luminal interface/superficial mucosa (Layer 1) and deep mucosa (Layer 2). This is in area of scar site. - Endosonographic images of the rectum were unremarkable. - No malignant-appearing lymph nodes were visualized endosonographically in the perirectal region and in the left iliac region. - Endosonographic images of the perirectal space were unremarkable. - The internal anal sphincter was visualized endosonographically and appeared normal.  Pathology FINAL MICROSCOPIC DIAGNOSIS:  A. RECTUM, SCAR LESION, BIOPSY:  - Fibrosis consistent with scar.  No residual neuroendocrine tumor  identified.    Laboratory Studies  Reviewed those in epic  Imaging Studies  November 2021  CT abdomen pelvis with contrast IMPRESSION: 1. Rectal lesion is difficult to visualize due to unprepped colon and lack of enteric contrast material within the distal colon1. 2. No findings of metastatic disease within the abdomen or pelvis. 3. Fat containing left inguinal hernia.   ASSESSMENT  Ms. Zee is a 51 y.o. 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  The patient is seen today for evaluation and management of:  1. Carcinoid tumor of rectum, unspecified whether malignant   2. Chronic nausea   3. History of Helicobacter infection   4. Loose stools   5. Irritable bowel syndrome with diarrhea   6. Elevated chromogranin A     The patient is clinically and hemodynamically stable at this time.  She has had evidence of a rectal neuroendocrine tumor that is status post endoscopic resection and subcentimeter in size.  The majority of these lesions will end up being benign and not invasive though monitoring is not unreasonable in the course of the next few years.  There are no strict guidelines in regards to overall follow-up but in the setting of her history as well as the elevated chromogranin A (with hope that this was elevated due to PPI use) we will recheck her chromogranin a level in the course of the next few weeks while off of PPI therapy.  She will stop PPI for the next 2 weeks and then come in for a laboratory evaluation.  If the chromogranin a level has normalized then we will plan for the 1 year follow-up lower EUS to monitor the site of previous resection.  If an elevated chromogranin A is found, we may need to perform a dotatate Netspot but we will cross that path if necessary.  The patient has never had an upper endoscopy and has had GERD symptoms for years.  She has no red flag symptoms.  We will consider a diagnostic endoscopy at the time of her follow-up lower  EUS in January 2023.  I have discussed lifestyle modifications with the patient in regards to her GERD as well.  All patient questions were answered to the best of my ability, and the patient agrees to the aforementioned plan of action with follow-up as indicated.  PLAN  Hold PPI for next 2 weeks Obtain chromogranin a level -If elevated will need to proceed with dotatate Netspot as next step to ensure no evidence of other neuroendocrine tumor presents -If normalized we will plan 1 year lower EUS for follow-up Plan will be for potentially 3 to 5-year follow-up lower EUS procedures Consideration of diagnostic endoscopy at time of next EUS due to longstanding history of GERD will be considered Full colonoscopy will be due in 2024   Orders Placed This Encounter  Procedures   Procedural/ Surgical Case Request: LOWER ENDOSCOPIC ULTRASOUND (EUS)   Helicobacter pylori special antigen   Stool Culture   Ova and parasite examination   Calprotectin, Fecal   CBC   Comp Met (CMET)   IgA   Tissue transglutaminase, IgA   Chromogranin A   Clostridium difficile Toxin B, Qualitative, Real-Time PCR   Pancreatic elastase, fecal   Ambulatory referral to Gastroenterology    New Prescriptions   No medications on file   Modified Medications   No medications on file    Planned Follow Up No follow-ups on file.   Total Time in Face-to-Face and in Coordination of Care for patient including independent/personal interpretation/review of prior testing, medical history, examination, medication adjustment, communicating results with the patient directly, and documentation with the EHR is 30 minutes.   Justice Britain, MD [facility] Office # [phone]

## 2022-01-08 ENCOUNTER — Encounter: Payer: Self-pay | Admitting: Adult Health

## 2022-01-08 ENCOUNTER — Encounter: Payer: Self-pay | Admitting: Gastroenterology

## 2022-01-15 ENCOUNTER — Other Ambulatory Visit (INDEPENDENT_AMBULATORY_CARE_PROVIDER_SITE_OTHER): Payer: No Typology Code available for payment source

## 2022-01-15 DIAGNOSIS — K580 Irritable bowel syndrome with diarrhea: Secondary | ICD-10-CM

## 2022-01-15 DIAGNOSIS — D3A026 Benign carcinoid tumor of the rectum: Secondary | ICD-10-CM | POA: Diagnosis not present

## 2022-01-15 DIAGNOSIS — R195 Other fecal abnormalities: Secondary | ICD-10-CM | POA: Diagnosis not present

## 2022-01-15 DIAGNOSIS — R110 Nausea: Secondary | ICD-10-CM | POA: Diagnosis not present

## 2022-01-15 DIAGNOSIS — Z8619 Personal history of other infectious and parasitic diseases: Secondary | ICD-10-CM

## 2022-01-15 LAB — COMPREHENSIVE METABOLIC PANEL
ALT: 19 U/L (ref 0–35)
AST: 21 U/L (ref 0–37)
Albumin: 4.1 g/dL (ref 3.5–5.2)
Alkaline Phosphatase: 76 U/L (ref 39–117)
BUN: 10 mg/dL (ref 6–23)
CO2: 28 mEq/L (ref 19–32)
Calcium: 9.4 mg/dL (ref 8.4–10.5)
Chloride: 104 mEq/L (ref 96–112)
Creatinine, Ser: 0.58 mg/dL (ref 0.40–1.20)
GFR: 104.43 mL/min (ref >60.00)
Glucose, Bld: 103 mg/dL — ABNORMAL HIGH (ref 70–99)
Potassium: 4.1 mEq/L (ref 3.5–5.1)
Sodium: 139 mEq/L (ref 135–145)
Total Bilirubin: 0.7 mg/dL (ref 0.2–1.2)
Total Protein: 7 g/dL (ref 6.0–8.3)

## 2022-01-15 LAB — CBC
HCT: 36.7 % (ref 36.0–46.0)
Hemoglobin: 12 g/dL (ref 12.0–15.0)
MCHC: 32.9 g/dL (ref 30.0–36.0)
MCV: 89.3 fl (ref 78.0–100.0)
Platelets: 283 10*3/uL (ref 150.0–400.0)
RBC: 4.11 Mil/uL (ref 3.87–5.11)
RDW: 15.2 % (ref 11.5–15.5)
WBC: 7.3 10*3/uL (ref 4.0–10.5)

## 2022-01-16 LAB — TISSUE TRANSGLUTAMINASE, IGA: (tTG) Ab, IgA: <1 U/mL

## 2022-01-16 LAB — IGA: Immunoglobulin A: 225 mg/dL (ref 47–310)

## 2022-01-19 LAB — STOOL CULTURE: E coli, Shiga toxin Assay: NEGATIVE

## 2022-01-19 LAB — CHROMOGRANIN A: Chromogranin A (ng/mL): 92.5 ng/mL (ref 0.0–101.8)

## 2022-01-21 ENCOUNTER — Other Ambulatory Visit: Payer: Self-pay | Admitting: Adult Health

## 2022-01-21 DIAGNOSIS — R7303 Prediabetes: Secondary | ICD-10-CM

## 2022-01-22 ENCOUNTER — Encounter: Payer: Self-pay | Admitting: Gastroenterology

## 2022-01-25 LAB — OVA AND PARASITE EXAMINATION
CONCENTRATE RESULT:: NONE SEEN
MICRO NUMBER:: 13506315
SPECIMEN QUALITY:: ADEQUATE
TRICHROME RESULT:: NONE SEEN

## 2022-01-25 LAB — HELICOBACTER PYLORI  SPECIAL ANTIGEN
MICRO NUMBER:: 13506313
SPECIMEN QUALITY: ADEQUATE

## 2022-01-25 LAB — CALPROTECTIN, FECAL: Calprotectin, Fecal: 17 ug/g (ref 0–120)

## 2022-01-25 LAB — CLOSTRIDIUM DIFFICILE TOXIN B, QUALITATIVE, REAL-TIME PCR: Toxigenic C. Difficile by PCR: NOT DETECTED

## 2022-01-25 LAB — PANCREATIC ELASTASE, FECAL: Pancreatic Elastase-1, Stool: >500 mcg/g

## 2022-01-26 ENCOUNTER — Other Ambulatory Visit: Payer: Self-pay | Admitting: Adult Health

## 2022-01-26 DIAGNOSIS — S83242A Other tear of medial meniscus, current injury, left knee, initial encounter: Secondary | ICD-10-CM

## 2022-01-26 NOTE — Telephone Encounter (Signed)
Amy can you help with this pt insurance?

## 2022-01-27 ENCOUNTER — Ambulatory Visit: Payer: No Typology Code available for payment source | Admitting: Surgical

## 2022-01-27 ENCOUNTER — Encounter: Payer: Self-pay | Admitting: Orthopedic Surgery

## 2022-01-27 DIAGNOSIS — S83242A Other tear of medial meniscus, current injury, left knee, initial encounter: Secondary | ICD-10-CM

## 2022-01-27 DIAGNOSIS — S83282A Other tear of lateral meniscus, current injury, left knee, initial encounter: Secondary | ICD-10-CM | POA: Diagnosis not present

## 2022-01-27 DIAGNOSIS — M25562 Pain in left knee: Secondary | ICD-10-CM

## 2022-01-27 MED ORDER — METHYLPREDNISOLONE ACETATE 40 MG/ML IJ SUSP
40.0000 mg | INTRAMUSCULAR | Status: AC | PRN
  Administered 2022-01-27: 40 mg via INTRA_ARTICULAR

## 2022-01-27 MED ORDER — BUPIVACAINE HCL 0.25 % IJ SOLN
4.0000 mL | INTRAMUSCULAR | Status: AC | PRN
  Administered 2022-01-27: 4 mL via INTRA_ARTICULAR

## 2022-01-27 MED ORDER — LIDOCAINE HCL 1 % IJ SOLN
5.0000 mL | INTRAMUSCULAR | Status: AC | PRN
  Administered 2022-01-27: 5 mL

## 2022-01-27 NOTE — Progress Notes (Cosign Needed)
Office Visit Note   Patient: Holly Horn           Date of Birth: February 19, 1971           MRN: [id number] Visit Date: 01/27/2022 Requested by: Dorothyann Peng, NP [place],  [place] [address] PCP: Dorothyann Peng, NP  Subjective: Chief Complaint  Patient presents with   Left Knee - Pain    HPI: Holly Horn is a 51 y.o. female who presents to the office complaining of left knee pain.  Patient states that 08/23/2021 she fell off of a curb, spraining her right ankle and falling directly onto the anterior aspect of her left knee.  She sustained a laceration that required 3 sutures.  Since then she has had increased knee pain that she mostly localizes to the anterior medial and anterolateral aspects of the knee.  She notes occasional clicking sensation but no locking of the knee.  She has not really had any issue with this knee prior to this injury.  No previous knee surgery.  She feels a little bit of weakness in the left knee and feels like the knee wants to buckle on her but has not caused her to fall.  She has tried ice, crutches, Advil.  She initially improved but has plateaued over the last month.  She denies any significant medical history but does note a family history of DVT.  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                ROS: All systems reviewed are negative as they relate to the chief complaint within the history of present illness.  Patient denies fevers or chills.  Assessment & Plan: Visit Diagnoses:  1. Tear of medial meniscus of left knee, current, unspecified tear type, initial encounter   2. Left knee pain, unspecified chronicity   3. Tear of lateral meniscus of left knee, current, unspecified tear type, initial encounter     Plan: Patient is a 51 year old female who presents following left knee injury when she fell directly on her knee about 2 months ago.  She has had persistent knee pain since  then after having not had any trouble with knee pain in the past.  She had radiographs that were taken that were negative for any significant pathology with no significant arthritis.  She had subsequent MRI scan was done at [facility] on 01/21/2022 that demonstrated horizontal tears of the medial and lateral menisci with sprain of the LCL and chondrosis of the patellofemoral medial compartments.  After discussion of options, plan to aspirate inject the left knee today.  Patient tolerated the procedure well.  She will go to physical therapy upstairs to design a home exercise program to focus on range of motion and quadricep strengthening.  Think that she would benefit from this with her slightly reduced quad strength compared with her contralateral leg.  Follow-up in 6 weeks for clinical recheck.  Follow-Up Instructions: No follow-ups on file.   Orders:  Orders Placed This Encounter  Procedures   Ambulatory referral to Physical Therapy   No orders of the defined types were placed in this encounter.     Procedures: Large Joint Inj: L knee on 01/27/2022 11:04 AM Indications: diagnostic evaluation, joint swelling and pain Details: 18 G 1.5 in needle, superolateral approach  Arthrogram: No  Medications: 5 mL lidocaine 1 %; 40 mg methylPREDNISolone acetate 40 MG/ML; 4 mL  bupivacaine 0.25 % Outcome: tolerated well, no immediate complications Procedure, treatment alternatives, risks and benefits explained, specific risks discussed. Consent was given by the patient. Immediately prior to procedure a time out was called to verify the correct patient, procedure, equipment, support staff and site/side marked as required. Patient was prepped and draped in the usual sterile fashion.       Clinical Data: No additional findings.  Objective: Vital Signs: LMP 08/23/2013   Physical Exam:  Constitutional: Patient appears well-developed HEENT:  Head: Normocephalic Eyes:EOM are normal Neck: Normal  range of motion Cardiovascular: Normal rate Pulmonary/chest: Effort normal Neurologic: Patient is alert Skin: Skin is warm Psychiatric: Patient has normal mood and affect  Ortho Exam: Ortho exam demonstrates left knee with small effusion.  Tenderness over the medial and lateral joint lines moderately.  Excellent quad strength of the right knee with 5 -/5 quad strength of the left knee.  She is able to perform straight leg raise without extensor lag.  No pain with hip range of motion.  Stable to varus and valgus stress at 0 and 30 degrees with no reproduction of pain with stressing the LCL.  Stable to anterior posterior drawer.  Small healed laceration noted over the anterior aspect of the knee.  She hyperextends on the right knee by about 2 to 3 degrees but extends on the left knee to about 0 to 2 degrees.  Specialty Comments:  No specialty comments available.  Imaging: No results found.   PMFS History: Patient Active Problem List   Diagnosis Date Noted   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   Chronic nausea 77/82/4235   History of Helicobacter infection 01/05/2022   Loose stools 01/05/2022   Irritable bowel syndrome with diarrhea 01/05/2022   Elevated chromogranin A 01/05/2022   Benign neuroendocrine tumor of rectum 10/07/2020   Elevated chromogranin A 10/07/2020   Hx of adenomatous colonic polyps 10/07/2020   Gastroesophageal reflux disease 10/07/2020   GERD (gastroesophageal reflux disease) 03/01/2018   Essential hypertension 07/21/2016   Anxiety and depression 07/21/2015   OBESITY 03/15/2008   TOBACCO ABUSE 09/28/2007   EXTRINSIC ASTHMA, UNSPECIFIED 09/26/2007   CHEST PAIN 07/03/2007   HEADACHE 04/03/2007   Past Medical History:  Diagnosis Date   Ectopic pregnancy    x 3   Essential hypertension    GERD (gastroesophageal reflux disease)    Hyperlipidemia     Family History  Problem Relation Age of Onset   Hypertension Mother    Osteoarthritis Father     Hypertension Brother    Cancer Maternal Grandmother        unknown   Cancer Maternal Grandfather        unknown   Cancer Paternal Grandmother        unknown   Cancer Paternal Grandfather        unknowon   Breast cancer Other    Colon cancer Neg Hx    Colon polyps Neg Hx    Esophageal cancer Neg Hx    Rectal cancer Neg Hx    Stomach cancer Neg Hx    Inflammatory bowel disease Neg Hx    Liver disease Neg Hx    Pancreatic cancer Neg Hx     Past Surgical History:  Procedure Laterality Date   BIOPSY  01/06/2021   Procedure: BIOPSY;  Surgeon: Irving Copas., MD;  Location: [facility];  Service: Gastroenterology;;   COLONOSCOPY  06/2020   ECTOPIC PREGNANCY SURGERY     ENDOSCOPIC MUCOSAL RESECTION  08/14/2020  Procedure: ENDOSCOPIC MUCOSAL RESECTION;  Surgeon: Rush Landmark Telford Nab., MD;  Location: [facility] ENDOSCOPY;  Service: Gastroenterology;;   ESOPHAGOGASTRODUODENOSCOPY N/A 01/06/2021   Procedure: ESOPHAGOGASTRODUODENOSCOPY (EGD);  Surgeon: Irving Copas., MD;  Location: [facility];  Service: Gastroenterology;  Laterality: N/A;   EUS N/A 08/14/2020   Procedure: LOWER ENDOSCOPIC ULTRASOUND (EUS);  Surgeon: Irving Copas., MD;  Location: [place];  Service: Gastroenterology;  Laterality: N/A;   EUS N/A 01/06/2021   Procedure: UPPER ENDOSCOPIC ULTRASOUND (EUS) RADIAL;  Surgeon: Irving Copas., MD;  Location: [facility];  Service: Gastroenterology;  Laterality: N/A;   FINE NEEDLE ASPIRATION  01/06/2021   Procedure: FINE NEEDLE ASPIRATION (FNA) LINEAR;  Surgeon: Irving Copas., MD;  Location: [facility];  Service: Gastroenterology;;   Otho Darner SIGMOIDOSCOPY N/A 08/14/2020   Procedure: Beryle Quant;  Surgeon: Irving Copas., MD;  Location: [place];  Service: Gastroenterology;  Laterality: N/A;   HEMOSTASIS CLIP PLACEMENT  08/14/2020   Procedure: HEMOSTASIS CLIP PLACEMENT;  Surgeon: Irving Copas., MD;  Location: [place];  Service: Gastroenterology;;   SUBMUCOSAL LIFTING INJECTION  08/14/2020   Procedure: SUBMUCOSAL LIFTING INJECTION;  Surgeon: Irving Copas., MD;  Location: [facility] END[facility]COPY;  Service: Gastroenterology;;   Christy Gentles Vein Surgery  2008 & 2010   Social History   Occupational History   Not on file  Tobacco Use   Smoking status: Some Days    Types: Cigarettes   Smokeless tobacco: Never   Tobacco comments:    socially  Vaping Use   Vaping Use: Never used  Substance and Sexual Activity   Alcohol use: Yes    Alcohol/week: 12.0 standard drinks of alcohol    Types: 12 Standard drinks or equivalent per week   Drug use: No   Sexual activity: Not on file

## 2022-02-10 NOTE — Therapy (Addendum)
OUTPATIENT PHYSICAL THERAPY EVALUATION /DISCHARGE   Patient Name: Holly Horn MRN: [id number] DOB:02-17-1971, 51 y.o., female Today's Date: 02/11/2022   PT End of Session - 02/11/22 0854     Visit Number 1    Number of Visits 10    Date for PT Re-Evaluation 04/22/22    PT Start Time 0846    PT Stop Time 0925    PT Time Calculation (min) 39 min    Activity Tolerance Patient tolerated treatment well             Past Medical History:  Diagnosis Date   Ectopic pregnancy    x 3   Essential hypertension    GERD (gastroesophageal reflux disease)    Hyperlipidemia    Past Surgical History:  Procedure Laterality Date   BIOPSY  01/06/2021   Procedure: BIOPSY;  Surgeon: Irving Copas., MD;  Location: [facility];  Service: Gastroenterology;;   COLONOSCOPY  06/2020   ECTOPIC PREGNANCY SURGERY     ENDOSCOPIC MUCOSAL RESECTION  08/14/2020   Procedure: ENDOSCOPIC MUCOSAL RESECTION;  Surgeon: Irving Copas., MD;  Location: [place];  Service: Gastroenterology;;   ESOPHAGOGASTRODUODENOSCOPY N/A 01/06/2021   Procedure: ESOPHAGOGASTRODUODENOSCOPY (EGD);  Surgeon: Irving Copas., MD;  Location: [facility];  Service: Gastroenterology;  Laterality: N/A;   EUS N/A 08/14/2020   Procedure: LOWER ENDOSCOPIC ULTRASOUND (EUS);  Surgeon: Irving Copas., MD;  Location: [place];  Service: Gastroenterology;  Laterality: N/A;   EUS N/A 01/06/2021   Procedure: UPPER ENDOSCOPIC ULTRASOUND (EUS) RADIAL;  Surgeon: Irving Copas., MD;  Location: [facility];  Service: Gastroenterology;  Laterality: N/A;   FINE NEEDLE ASPIRATION  01/06/2021   Procedure: FINE NEEDLE ASPIRATION (FNA) LINEAR;  Surgeon: Irving Copas., MD;  Location: [facility];  Service: Gastroenterology;;   Otho Darner SIGMOIDOSCOPY N/A 08/14/2020   Procedure: Beryle Quant;  Surgeon: Irving Copas., MD;  Location: [place];  Service: Gastroenterology;   Laterality: N/A;   HEMOSTASIS CLIP PLACEMENT  08/14/2020   Procedure: HEMOSTASIS CLIP PLACEMENT;  Surgeon: Irving Copas., MD;  Location: [place];  Service: Gastroenterology;;   SUBMUCOSAL LIFTING INJECTION  08/14/2020   Procedure: SUBMUCOSAL LIFTING INJECTION;  Surgeon: Irving Copas., MD;  Location: [facility];  Service: Gastroenterology;;   [facility]  2008 & 2010   Patient Active Problem List   Diagnosis Date Noted   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   Chronic nausea 03/00/9233   History of Helicobacter infection 01/05/2022   Loose stools 01/05/2022   Irritable bowel syndrome with diarrhea 01/05/2022   Elevated chromogranin A 01/05/2022   Benign neuroendocrine tumor of rectum 10/07/2020   Elevated chromogranin A 10/07/2020   Hx of adenomatous colonic polyps 10/07/2020   Gastroesophageal reflux disease 10/07/2020   GERD (gastroesophageal reflux disease) 03/01/2018   Essential hypertension 07/21/2016   Anxiety and depression 07/21/2015   OBESITY 03/15/2008   TOBACCO ABUSE 09/28/2007   EXTRINSIC ASTHMA, UNSPECIFIED 09/26/2007   CHEST PAIN 07/03/2007   HEADACHE 04/03/2007    PCP: Dorothyann Peng NP  REFERRING PROVIDER: Meredith Pel, MD  REFERRING DIAG: [phone] (ICD-10-CM) - Left knee pain, unspecified chronicity  THERAPY DIAG:  Chronic pain of left knee  Muscle weakness (generalized)  Difficulty in walking, not elsewhere classified  Rationale for Evaluation and Treatment Rehabilitation  ONSET DATE: 11/21/2021  SUBJECTIVE:   SUBJECTIVE STATEMENT: Pt indicated fall on November 21, 2021 and had stitches.  Pt indicated having pain onset about 3 weeks after that led to  referral to ortho MD.  Pt indicated she had an injection in knee that helped a lot and had fluid pulled off.  Pt indicated having weakness in leg due to favoring it.    PERTINENT HISTORY: unremarkable  PAIN:  NPRS scale: current 3-4/10, at worst in last few weeks  7/10, at best 0/10 Pain location: Lt knee (across knee joint) Pain description: ache, sharp Aggravating factors: getting up/down, fatigue with prolonged standing/walking, stairs Relieving factors: rest  PRECAUTIONS: None  WEIGHT BEARING RESTRICTIONS No  FALLS:  Has patient fallen in last 6 months? 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   PATIENT SURVEYS:   02/11/2022: No FOTO performed today  COGNITION: 02/11/2022 Overall cognitive status: Within functional limits for tasks assessed     SENSATION: 02/11/2022 WFL  PALPATION: 02/11/2022 Mild tenderness lateral joint line Lt knee  LOWER EXTREMITY ROM:  AROM Right 02/11/2022 Left 02/11/2022  Hip flexion    Hip extension    Hip abduction    Hip adduction    Hip internal rotation    Hip external rotation    Knee flexion 130 120 in supine heel slide c pain end range  Knee extension 0 0 c discomfort  Ankle dorsiflexion    Ankle plantarflexion    Ankle inversion    Ankle eversion     (Blank rows = not tested)  LOWER EXTREMITY MMT:  MMT Right 02/11/2022 Left 02/11/2022  Hip flexion 5/5 5/5  Hip extension    Hip abduction    Hip adduction    Hip internal rotation    Hip external rotation    Knee flexion 5/5 5/5  Knee extension 71.3, 71.6 5/5 43, 45 5/5 62%   Ankle dorsiflexion 5/5 5/5  Ankle plantarflexion    Ankle inversion    Ankle  eversion     (Blank rows = not tested)   FUNCTIONAL TESTS:  02/11/2022 18 inch chair on 2nd try c pain s UE assist   Lt SLS:  < 3 seconds  Rt SLS:  5 seconds  GAIT: 02/11/2022  Distance walked: household distances within clinic environment Independent c mild reduced stance on Lt knee, decreased toe off progression.    TODAY'S TREATMENT: 02/11/2022 Therex:  HEP instruction/performance c cues for techniques, handout provided.  Trial set performed of each for comprehension and symptom assessment.  See below for exercise list   PATIENT EDUCATION:  Education details: HEP Person educated: Patient Education method: Explanation, Demonstration, Verbal cues, and Handouts Education comprehension: verbalized understanding, returned demonstration, and verbal cues required   HOME EXERCISE PROGRAM: Access Code: [email or web address] URL: [email or web address] Date: 02/11/2022 Prepared by: Scot Jun  Exercises - Clamshell  - 1 x daily - 7 x weekly - 3 sets - 10 reps - Sit to Stand  - 2-3 x daily - 7 x weekly - 1 sets - 10 reps - Seated Long Arc Quad  - 3-5 x daily - 7 x weekly -  1-2 sets - 10 reps - 2 hold - Seated Quad Set  - 3-5 x daily - 7 x weekly - 1 sets - 5-10 reps - 5 hold - Seated Straight Leg Heel Taps  - 1-2 x daily - 7 x weekly - 3 sets - 10 reps - Gastroc Stretch on Wall  - 2 x daily - 7 x weekly - 1 sets - 3-5 reps - 15-30 hold  ASSESSMENT:  CLINICAL IMPRESSION: Patient is a 51 y.o. who comes to clinic with complaints of Lt knee pain with mobility, strength and movement coordination deficits that impair their ability to perform usual daily and recreational functional activities without increase difficulty/symptoms at this time.  Patient to benefit from skilled PT services to address impairments and limitations to improve to previous level of function without restriction secondary to condition.    OBJECTIVE IMPAIRMENTS Abnormal gait, decreased activity tolerance,  decreased balance, decreased coordination, decreased endurance, decreased mobility, difficulty walking, decreased ROM, decreased strength, hypomobility, impaired perceived functional ability, impaired flexibility, improper body mechanics, and pain.   ACTIVITY LIMITATIONS carrying, lifting, bending, sitting, standing, squatting, stairs, transfers, and locomotion level  PARTICIPATION LIMITATIONS: cleaning, laundry, interpersonal relationship, shopping, community activity, and occupation  PERSONAL FACTORS  no specific factors  are also affecting patient's functional outcome.   REHAB POTENTIAL: Good  CLINICAL DECISION MAKING: Stable/uncomplicated  EVALUATION COMPLEXITY: Low   GOALS: Goals reviewed with patient? Yes  Short term PT Goals (target date for Short term goals are 3 weeks 03/04/2022) Patient will demonstrate independent use of home exercise program to maintain progress from in clinic treatments. Goal status: New   Long term PT goals (target dates for all long term goals are 10 weeks  04/22/2022 )  1. Patient will demonstrate/report pain at worst less than or equal to 2/10 to facilitate minimal limitation in daily activity secondary to pain symptoms. Goal status: New  2. Patient will demonstrate independent use of home exercise program to facilitate ability to maintain/progress functional gains from skilled physical therapy services. Goal status: New  3. Patient will demonstrate Lt knee extension MMT 5/5, dynamometry within 20% of Rt to facilitate standing/walking stairs at PLOF.  Goal status: New  4.  Patient will demonstrate Lt  knee AROM 0-130 degrees to facilitate ability to perform transfers, sitting, ambulation, stair navigation s restriction due to mobility. Goal status: New  5.  Patient will demonstrate B SLS > or = 15 seconds to facilitate stability in ambulation on even/uneven surfaces to reduce fall risk.   Goal status: New  6.  Patient will demonstrate 18 inch  chair transfer up/down s UE assist on 1st try.   Goal status: New  7.  Patient will demonstrate/report ability to ascend/descend 6 inch step s UE assist reciprocally.  a.  Goal Status: New   PLAN: PT FREQUENCY: 1-2x/week  PT DURATION: 10 weeks  PLANNED INTERVENTIONS: Therapeutic exercises, Therapeutic activity, Neuro Muscular re-education, Balance training, Gait training, Patient/Family education, Joint mobilization, Stair training, DME instructions, Dry Needling, Electrical stimulation, Cryotherapy, Moist heat, Taping, Ultrasound, Ionotophoresis 4mg /ml Dexamethasone, and Manual therapy.  All included unless contraindicated  PLAN FOR NEXT SESSION: review HEP, progress strength program    Scot Jun, PT, DPT, OCS, ATC 02/11/22  9:42 AM  PHYSICAL THERAPY DISCHARGE SUMMARY  Visits from Start of Care: 1  Current functional level related to goals / functional outcomes: See note   Remaining deficits: See note   Education / Equipment: HEP   Patient agrees to discharge. Patient  goals were not met. Patient is being discharged due to financial reasons.   Scot Jun, PT, DPT, OCS, ATC 03/04/22  10:56 AM

## 2022-02-11 ENCOUNTER — Ambulatory Visit: Payer: No Typology Code available for payment source | Admitting: Rehabilitative and Restorative Service Providers"

## 2022-02-11 ENCOUNTER — Other Ambulatory Visit: Payer: Self-pay

## 2022-02-11 ENCOUNTER — Encounter: Payer: Self-pay | Admitting: Rehabilitative and Restorative Service Providers"

## 2022-02-11 DIAGNOSIS — G8929 Other chronic pain: Secondary | ICD-10-CM | POA: Diagnosis not present

## 2022-02-11 DIAGNOSIS — R262 Difficulty in walking, not elsewhere classified: Secondary | ICD-10-CM | POA: Diagnosis not present

## 2022-02-11 DIAGNOSIS — M6281 Muscle weakness (generalized): Secondary | ICD-10-CM

## 2022-02-11 DIAGNOSIS — M25562 Pain in left knee: Secondary | ICD-10-CM | POA: Diagnosis not present

## 2022-02-18 ENCOUNTER — Encounter: Payer: No Typology Code available for payment source | Admitting: Rehabilitative and Restorative Service Providers"

## 2022-02-20 IMAGING — CR DG ELBOW 2V*L*
2 series · 2 of 2 positions shown · non-contrast
Comparison: None.

CLINICAL DATA: Fall, laceration to left elbow.

EXAM:
LEFT ELBOW - 2 VIEW

[elbow ap]
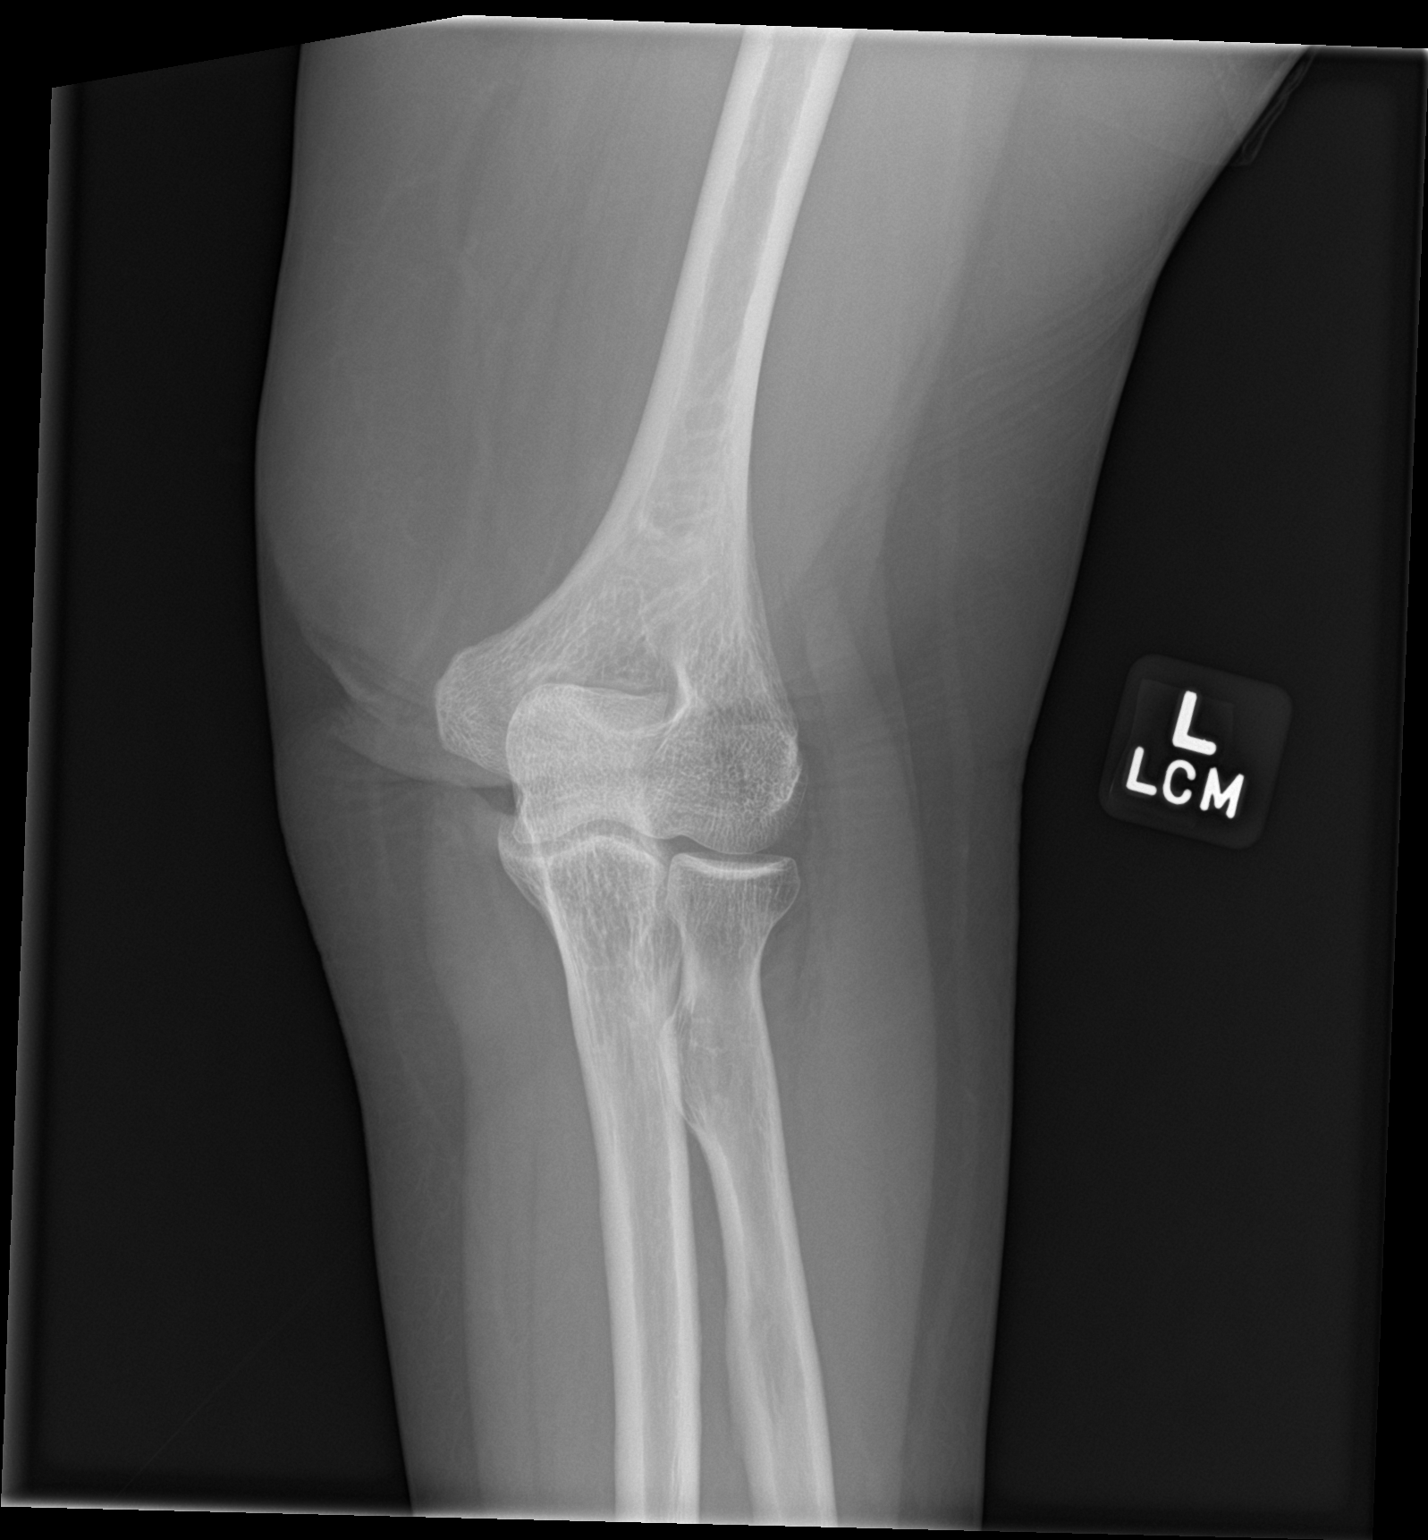

[elbow lat]
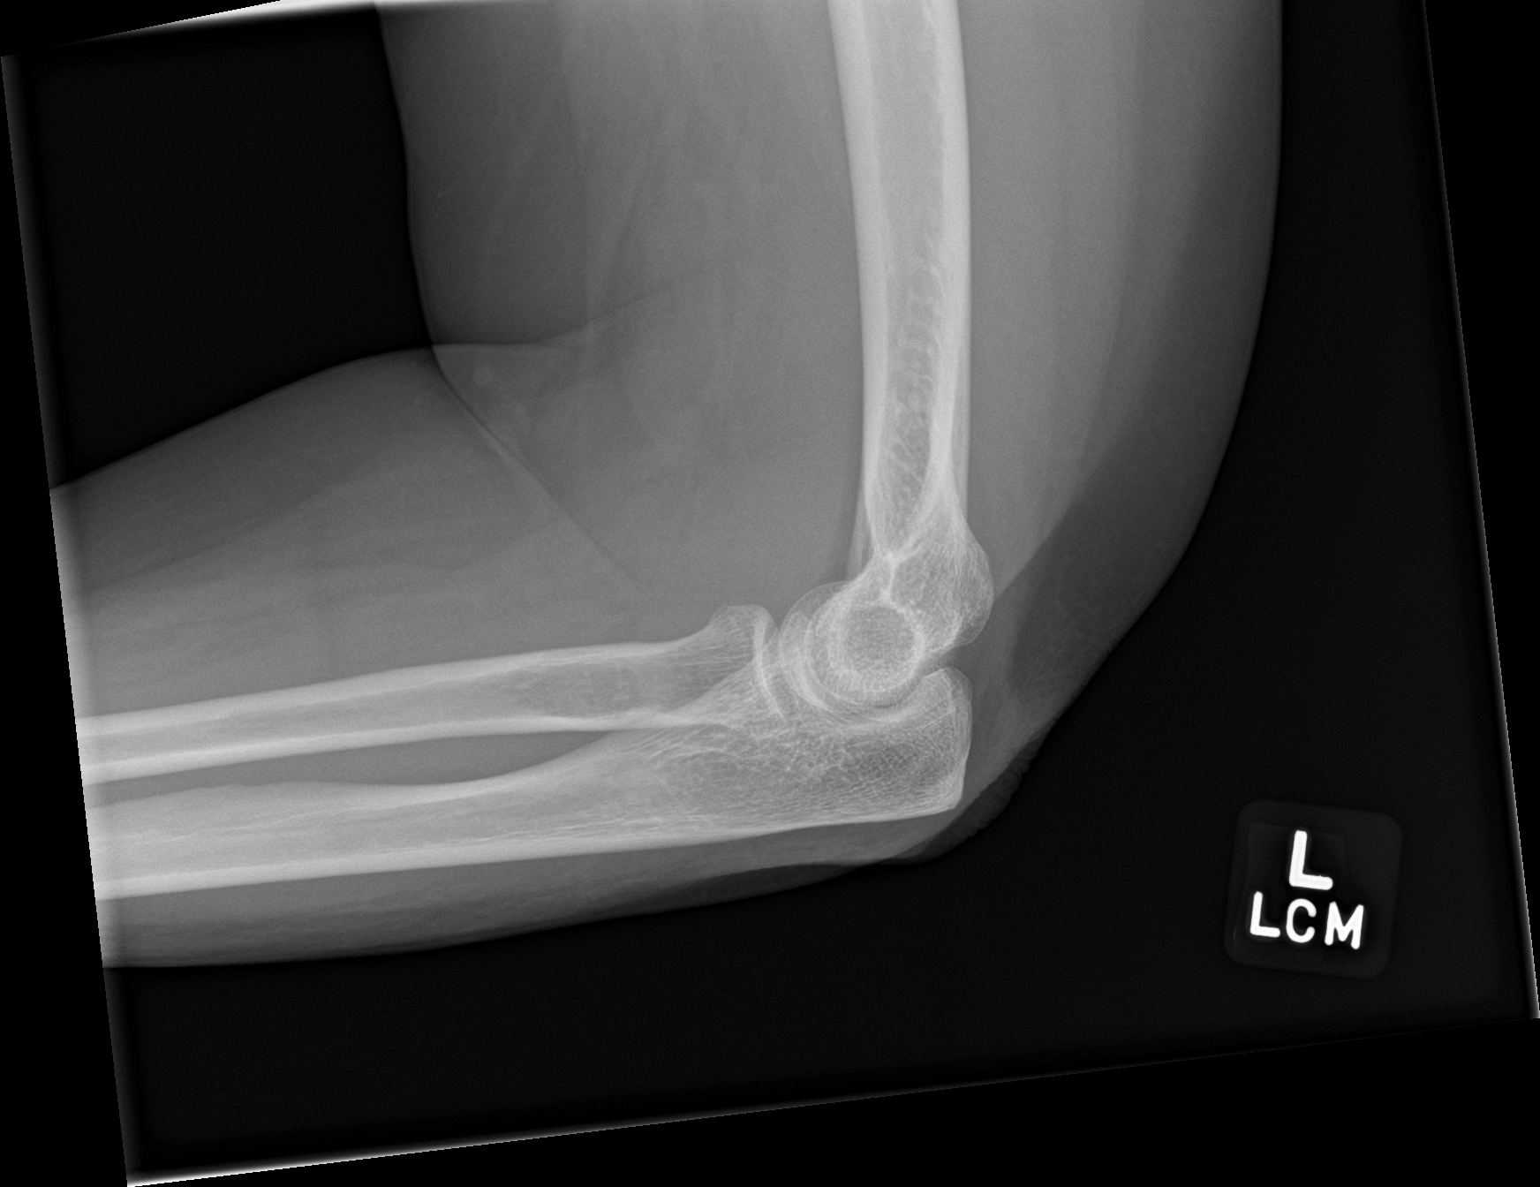

[2 of 2 positions shown; findings below may reference images not displayed]

FINDINGS: There is no evidence of fracture, dislocation, or joint effusion.
There is no evidence of arthropathy or other focal bone abnormality.
Soft tissues are unremarkable.
IMPRESSION: Negative.

## 2022-02-22 ENCOUNTER — Telehealth: Payer: Self-pay | Admitting: Gastroenterology

## 2022-02-22 ENCOUNTER — Other Ambulatory Visit: Payer: Self-pay | Admitting: Adult Health

## 2022-02-22 DIAGNOSIS — K219 Gastro-esophageal reflux disease without esophagitis: Secondary | ICD-10-CM

## 2022-02-22 NOTE — Telephone Encounter (Signed)
Left message on machine to call back  

## 2022-02-22 NOTE — Telephone Encounter (Signed)
Patient called to cancel procedure at the hospital with Dr. Rush Landmark.  She has some changes at work and needs to make sure her insurance will cover this first before she reschedules.  Thank you.

## 2022-02-23 NOTE — Telephone Encounter (Signed)
Left message on machine to call back  

## 2022-02-24 NOTE — Telephone Encounter (Signed)
The pt wants to cancel the upcoming procedure due to finances.  She will call back when she is ready to reschedule.

## 2022-02-26 ENCOUNTER — Encounter: Payer: No Typology Code available for payment source | Admitting: Rehabilitative and Restorative Service Providers"

## 2022-03-02 ENCOUNTER — Encounter (HOSPITAL_COMMUNITY): Payer: Self-pay

## 2022-03-02 ENCOUNTER — Ambulatory Visit (HOSPITAL_COMMUNITY): Admit: 2022-03-02 | Payer: No Typology Code available for payment source | Admitting: Gastroenterology

## 2022-03-02 SURGERY — ULTRASOUND, LOWER GI TRACT, ENDOSCOPIC
Anesthesia: Monitor Anesthesia Care

## 2022-03-05 ENCOUNTER — Encounter: Payer: No Typology Code available for payment source | Admitting: Rehabilitative and Restorative Service Providers"

## 2022-03-12 ENCOUNTER — Encounter: Payer: No Typology Code available for payment source | Admitting: Rehabilitative and Restorative Service Providers"

## 2022-04-14 DIAGNOSIS — H2513 Age-related nuclear cataract, bilateral: Secondary | ICD-10-CM | POA: Diagnosis not present

## 2022-04-14 DIAGNOSIS — H52203 Unspecified astigmatism, bilateral: Secondary | ICD-10-CM | POA: Diagnosis not present

## 2022-04-27 ENCOUNTER — Other Ambulatory Visit: Payer: Self-pay | Admitting: Adult Health

## 2022-04-27 DIAGNOSIS — F32A Depression, unspecified: Secondary | ICD-10-CM

## 2022-04-27 DIAGNOSIS — F419 Anxiety disorder, unspecified: Secondary | ICD-10-CM

## 2022-05-10 ENCOUNTER — Telehealth: Payer: Self-pay

## 2022-05-10 NOTE — Telephone Encounter (Signed)
The pt was contacted and asked to reschedule her EUS. She says she is still in process of getting insurance and will call back when she is ready.

## 2022-05-10 NOTE — Telephone Encounter (Signed)
-----   Message from Timothy Lasso, RN sent at 02/24/2022  9:07 AM EDT ----- Pt to call back and reschedule the lower EUS

## 2022-05-14 ENCOUNTER — Encounter: Payer: Self-pay | Admitting: Adult Health

## 2022-05-27 ENCOUNTER — Emergency Department (HOSPITAL_BASED_OUTPATIENT_CLINIC_OR_DEPARTMENT_OTHER): Payer: BC Managed Care – PPO | Admitting: Radiology

## 2022-05-27 ENCOUNTER — Encounter (HOSPITAL_BASED_OUTPATIENT_CLINIC_OR_DEPARTMENT_OTHER): Payer: Self-pay

## 2022-05-27 ENCOUNTER — Emergency Department (HOSPITAL_BASED_OUTPATIENT_CLINIC_OR_DEPARTMENT_OTHER): Payer: BC Managed Care – PPO

## 2022-05-27 ENCOUNTER — Other Ambulatory Visit: Payer: Self-pay

## 2022-05-27 ENCOUNTER — Emergency Department (HOSPITAL_BASED_OUTPATIENT_CLINIC_OR_DEPARTMENT_OTHER)
Admission: EM | Admit: 2022-05-27 | Discharge: 2022-05-27 | Disposition: A | Discharge status: Home / Self Care | Payer: BC Managed Care – PPO | Attending: Emergency Medicine | Admitting: Emergency Medicine

## 2022-05-27 DIAGNOSIS — Z79899 Other long term (current) drug therapy: Secondary | ICD-10-CM | POA: Insufficient documentation

## 2022-05-27 DIAGNOSIS — R2243 Localized swelling, mass and lump, lower limb, bilateral: Secondary | ICD-10-CM | POA: Insufficient documentation

## 2022-05-27 DIAGNOSIS — J9811 Atelectasis: Secondary | ICD-10-CM | POA: Diagnosis not present

## 2022-05-27 DIAGNOSIS — R079 Chest pain, unspecified: Secondary | ICD-10-CM | POA: Diagnosis not present

## 2022-05-27 DIAGNOSIS — D72829 Elevated white blood cell count, unspecified: Secondary | ICD-10-CM | POA: Diagnosis not present

## 2022-05-27 DIAGNOSIS — M79652 Pain in left thigh: Secondary | ICD-10-CM | POA: Insufficient documentation

## 2022-05-27 DIAGNOSIS — R0789 Other chest pain: Secondary | ICD-10-CM | POA: Diagnosis not present

## 2022-05-27 DIAGNOSIS — I10 Essential (primary) hypertension: Secondary | ICD-10-CM | POA: Insufficient documentation

## 2022-05-27 LAB — CBC
HCT: 35.6 % — ABNORMAL LOW (ref 36.0–46.0)
Hemoglobin: 11.6 g/dL — ABNORMAL LOW (ref 12.0–15.0)
MCH: 28.6 pg (ref 26.0–34.0)
MCHC: 32.6 g/dL (ref 30.0–36.0)
MCV: 87.9 fL (ref 80.0–100.0)
Platelets: 296 10*3/uL (ref 150–400)
RBC: 4.05 MIL/uL (ref 3.87–5.11)
RDW: 14.9 % (ref 11.5–15.5)
WBC: 11.8 10*3/uL — ABNORMAL HIGH (ref 4.0–10.5)
nRBC: 0 % (ref 0.0–0.2)

## 2022-05-27 LAB — BASIC METABOLIC PANEL
Anion gap: 10 (ref 5–15)
BUN: 12 mg/dL (ref 6–20)
CO2: 24 mmol/L (ref 22–32)
Calcium: 9.6 mg/dL (ref 8.9–10.3)
Chloride: 103 mmol/L (ref 98–111)
Creatinine, Ser: 0.59 mg/dL (ref 0.44–1.00)
GFR, Estimated: >60 mL/min (ref >60)
Glucose, Bld: 99 mg/dL (ref 70–99)
Potassium: 4.1 mmol/L (ref 3.5–5.1)
Sodium: 137 mmol/L (ref 135–145)

## 2022-05-27 LAB — TROPONIN I (HIGH SENSITIVITY)
Troponin I (High Sensitivity): 2 ng/L (ref <18)
Troponin I (High Sensitivity): <2 ng/L (ref <18)

## 2022-05-27 LAB — HCG, SERUM, QUALITATIVE: Preg, Serum: NEGATIVE

## 2022-05-27 MED ORDER — LORAZEPAM 2 MG/ML IJ SOLN
0.5000 mg | Freq: Once | INTRAMUSCULAR | Status: AC
  Administered 2022-05-27: 0.5 mg via INTRAVENOUS
  Filled 2022-05-27 (×2): qty 1

## 2022-05-27 MED ORDER — KETOROLAC TROMETHAMINE 15 MG/ML IJ SOLN
15.0000 mg | Freq: Once | INTRAMUSCULAR | Status: AC
  Administered 2022-05-27: 15 mg via INTRAVENOUS
  Filled 2022-05-27: qty 1

## 2022-05-27 MED ORDER — IOHEXOL 350 MG/ML SOLN
100.0000 mL | Freq: Once | INTRAVENOUS | Status: AC | PRN
  Administered 2022-05-27: 100 mL via INTRAVENOUS

## 2022-05-27 NOTE — ED Notes (Signed)
Patient transported to CT 

## 2022-05-27 NOTE — ED Triage Notes (Signed)
Patient here POV from Home.  Endorses CP that began at 0130 this AM and awoke the Patient. Continued since it began and has remained constant since.   Some SOB. Some Nausea Earlier. No Emesis.   NAD Noted during Triage. A&Ox4. GCS 15. Ambulatory.

## 2022-05-27 NOTE — ED Notes (Addendum)
CP and SHOB started 0130 this am States this is not r/t anxiety States she can not get a deep breath in

## 2022-05-27 NOTE — Discharge Instructions (Signed)
Return to the emergency room at anytime for any worsening or concerning symptoms. Provided with referral to cardiology, they will call and schedule an appointment with you.  Also recommend follow-up with your primary care provider.

## 2022-05-27 NOTE — ED Provider Notes (Signed)
[facility] Provider Note   CSN: [id number] Arrival date & time: 05/27/22  1231     History  Chief Complaint  Patient presents with   Chest Pain    Holly Horn is a 51 y.o. female.  51 year old female with history of CP (not previously evaluated), hypertension, hyperlipidemia, GERD (on PPI), anxiety, depression, onset 12 hours ago (onset around 1:00 this morning, woke patient from her sleep, has been constant since that time), substernal and radiates to left and back. Pain is worse with lying supine and taking a deep breath, improves with applying pressure to chest. Also reports left upper thigh pain that she was planning on seeing her PCP for next month but CP persisted today which prompted ER visit today. Denies cough. Reports DOE.  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 Daily smoker, drinks alcohol.  Home COVID negative.        Home Medications Prior to Admission medications   Medication Sig Start Date End Date Taking? Authorizing Provider  citalopram (CELEXA) 40 MG tablet TAKE 1 TABLET BY MOUTH EVERY DAY 04/28/22   Nafziger, Tommi Rumps, NP  ibuprofen (ADVIL) 200 MG tablet Take 400 mg by mouth every 8 (eight) hours as needed (pain).    [provider]  losartan (COZAAR) 50 MG tablet TAKE 1 TABLET BY MOUTH EVERY DAY Patient taking differently: Take 50 mg by mouth daily. 06/01/21   Nafziger, Tommi Rumps, NP  omeprazole (PRILOSEC) 20 MG capsule TAKE 1 CAPSULE BY MOUTH EVERY DAY 02/23/22   Nafziger, Tommi Rumps, NP  ondansetron (ZOFRAN-ODT) 8 MG disintegrating tablet Take 1 tablet (8 mg total) by mouth every 8 (eight) hours as needed for nausea or vomiting. Patient not taking: Reported on 01/05/2022 10/22/21   Mansouraty, Telford Nab., MD  rosuvastatin (CRESTOR) 10 MG tablet Take 1 tablet (10 mg total) by mouth daily. 07/29/21   Nafziger, Tommi Rumps, NP  temazepam (RESTORIL) 30  MG capsule TAKE 1 CAPSULE BY MOUTH AT BEDTIME AS NEEDED FOR SLEEP. 12/11/21   Nafziger, Tommi Rumps, NP      Allergies    Morphine sulfate, Lisinopril, and Progesterone    Review of Systems   Review of Systems Negative except as per HPI Physical Exam Updated Vital Signs BP 138/77   Pulse 75   Temp 98 F (36.7 C)   Resp 18   Ht '5\' 7"'$  (1.702 m)   Wt 124.7 kg   LMP 08/23/2013   SpO2 97%   BMI 43.06 kg/m  Physical Exam Vitals and nursing note reviewed.  Constitutional:      General: She is not in acute distress.    Appearance: She is well-developed. She is obese. She is not diaphoretic.     Comments: Appears uncomfortable, holding her chest  HENT:     Head: Normocephalic and atraumatic.  Cardiovascular:     Rate and Rhythm: Normal rate and regular rhythm.     Heart sounds: Normal heart sounds.  Pulmonary:     Effort: Pulmonary effort is normal.     Breath sounds: Normal breath sounds. No decreased breath sounds or wheezing.  Chest:     Chest wall: No tenderness.  Abdominal:     Palpations: Abdomen is soft.     Tenderness: There is no abdominal tenderness.  Musculoskeletal:     Right lower leg: Edema present.     Left lower leg: Edema present.  Comments: Mild pitting edema bilateral lower extremities  Skin:    General: Skin is warm and dry.     Findings: No erythema.  Neurological:     Mental Status: She is alert and oriented to person, place, and time.  Psychiatric:        Behavior: Behavior normal.     ED Results / Procedures / Treatments   Labs (all labs ordered are listed, but only abnormal results are displayed) Labs Reviewed  CBC - Abnormal; Notable for the following components:      Result Value   WBC 11.8 (*)    Hemoglobin 11.6 (*)    HCT 35.6 (*)    All other components within normal limits  BASIC METABOLIC PANEL  HCG, SERUM, QUALITATIVE  TROPONIN I (HIGH SENSITIVITY)  TROPONIN I (HIGH SENSITIVITY)    EKG EKG Interpretation  Date/Time:  Thursday  May 27 2022 12:36:58 EDT Ventricular Rate:  75 PR Interval:  172 QRS Duration: 90 QT Interval:  378 QTC Calculation: 422 R Axis:   68 Text Interpretation: Normal sinus rhythm Normal ECG No previous ECGs available normal Confirmed by Charlesetta Shanks [phone]) on 05/27/2022 1:21:55 PM  Radiology CT Angio Chest/Abd/Pel for Dissection W and/or W/WO  Result Date: 05/27/2022 CLINICAL DATA:  Chest pain. EXAM: CT ANGIOGRAPHY CHEST, ABDOMEN AND PELVIS TECHNIQUE: Non-contrast CT of the chest was initially obtained. Multidetector CT imaging through the chest, abdomen and pelvis was performed using the standard protocol during bolus administration of intravenous contrast. Multiplanar reconstructed images and MIPs were obtained and reviewed to evaluate the vascular anatomy. RADIATION DOSE REDUCTION: This exam was performed according to the departmental dose-optimization program which includes automated exposure control, adjustment of the mA and/or kV according to patient size and/or use of iterative reconstruction technique. CONTRAST:  168m OMNIPAQUE IOHEXOL 350 MG/ML SOLN COMPARISON:  July 04, 2020. FINDINGS: CTA CHEST FINDINGS Cardiovascular: Preferential opacification of the thoracic aorta. No evidence of thoracic aortic aneurysm or dissection. Normal heart size. No pericardial effusion. Mediastinum/Nodes: No enlarged mediastinal, hilar, or axillary lymph nodes. Thyroid gland, trachea, and esophagus demonstrate no significant findings. Lungs/Pleura: Lungs are clear. No pleural effusion or pneumothorax. Musculoskeletal: No chest wall abnormality. No acute or significant osseous findings. Review of the MIP images confirms the above findings. CTA ABDOMEN AND PELVIS FINDINGS VASCULAR Aorta: Normal caliber aorta without aneurysm, dissection, vasculitis or significant stenosis. Celiac: Patent without evidence of aneurysm, dissection, vasculitis or significant stenosis. SMA: Patent without evidence of aneurysm,  dissection, vasculitis or significant stenosis. Renals: Both renal arteries are patent without evidence of aneurysm, dissection, vasculitis, fibromuscular dysplasia or significant stenosis. IMA: Patent without evidence of aneurysm, dissection, vasculitis or significant stenosis. Inflow: Patent without evidence of aneurysm, dissection, vasculitis or significant stenosis. Veins: No obvious venous abnormality within the limitations of this arterial phase study. Review of the MIP images confirms the above findings. NON-VASCULAR Hepatobiliary: No focal liver abnormality is seen. No gallstones, gallbladder wall thickening, or biliary dilatation. Pancreas: Unremarkable. No pancreatic ductal dilatation or surrounding inflammatory changes. Spleen: Normal in size without focal abnormality. Adrenals/Urinary Tract: Adrenal glands are unremarkable. Kidneys are normal, without renal calculi, focal lesion, or hydronephrosis. Bladder is unremarkable. Stomach/Bowel: Stomach is within normal limits. Appendix appears normal. No evidence of bowel wall thickening, distention, or inflammatory changes. Lymphatic: No adenopathy is noted. Reproductive: Uterus and bilateral adnexa are unremarkable. Other: No abdominal wall hernia or abnormality. No abdominopelvic ascites. Musculoskeletal: No acute or significant osseous findings. Review of the MIP images confirms the above findings.  IMPRESSION: No evidence of thoracic or abdominal aortic dissection or aneurysm. No definite abnormality seen in the chest, abdomen or pelvis. Electronically Signed   By: Marijo Conception M.D.   On: 05/27/2022 15:37   DG Chest 2 View  Result Date: 05/27/2022 CLINICAL DATA:  Central chest pain since this morning. History of hypertension and gastroesophageal reflux disease. EXAM: CHEST - 2 VIEW COMPARISON:  Right rib radiographs 07/03/2007.  PET-CT 12/11/2020. FINDINGS: The heart size and mediastinal contours are normal. There are low lung volumes with mild  bibasilar atelectasis. No edema, confluent airspace opacity, pleural effusion or pneumothorax demonstrated. There are mild degenerative changes in the spine without evidence of acute osseous abnormality. IMPRESSION: Low lung volumes with mild bibasilar atelectasis. No acute cardiopulmonary process. Electronically Signed   By: Richardean Sale M.D.   On: 05/27/2022 13:08    Procedures Procedures    Medications Ordered in ED Medications  LORazepam (ATIVAN) injection 0.5 mg (0.5 mg Intravenous Given 05/27/22 1500)  iohexol (OMNIPAQUE) 350 MG/ML injection 100 mL (100 mLs Intravenous Contrast Given 05/27/22 1514)  ketorolac (TORADOL) 15 MG/ML injection 15 mg (15 mg Intravenous Given 05/27/22 1558)    ED Course/ Medical Decision Making/ A&P                           Medical Decision Making Amount and/or Complexity of Data Reviewed Labs: ordered. Radiology: ordered.  Risk Prescription drug management.   This patient presents to the ED for concern of chest discomfort, this involves an extensive number of treatment options, and is a complaint that carries with it a high risk of complications and morbidity.  The differential diagnosis includes but not limited to ACS, dissection, PE, GERD, arrhythmia    Co morbidities that complicate the patient evaluation  Tobacco use, hypertension, GERD, hyperlipidemia   Additional history obtained:  External records from outside source obtained and reviewed including recent GI evaluation and pending work-up reviewed.   Lab Tests:  I Ordered, and personally interpreted labs.  The pertinent results include: Troponin x2 negative (less than 2), hCG negative.  CBC with nonspecific mild extensive white count 11.8.  BMP unremarkable.   Imaging Studies ordered:  I ordered imaging studies including chest x-ray, CTA chest/abdomen/pelvis I independently visualized and interpreted imaging which showed rest x-ray is unremarkable.  CT as read by radiology, no  significant findings. I agree with the radiologist interpretation   Cardiac Monitoring: / EKG:  The patient was maintained on a cardiac monitor.  I personally viewed and interpreted the cardiac monitored which showed an underlying rhythm of: Normal sinus rhythm, rate 75  Problem List / ED Course / Critical interventions / Medication management  51 year old female presents with complaint of chest pain and shortness of breath as above.  On exam, she is uncomfortable appearing, clutching her chest.  She does not have chest wall tenderness, heart regular rate and rhythm, lungs clear to auscultation, pulses equal in all 4 extremities.  Patient's blood pressure is mildly elevated.  She was given Ativan for comfort to try to help ease her pain and allow her to get through her CT.  CT dissection study obtained and does not show any acute findings in the chest, abdomen, pelvis, negative for dissection.  Her troponins are negative x2, her EKG does not suggest ischemic changes.  Patient continued to have pain and was provided with injection of Toradol.  She reports ongoing discomfort however appears more comfortable in bed,  no longer clutching chest.  Offered reassurance, request return for severe or concerning symptoms, change in symptoms otherwise provided with ambulatory referral to cardiology, recommend follow-up with her primary care provider. I ordered medication including Ativan, Toradol for pain Reevaluation of the patient after these medicines showed that the patient improved- reports ongoing discomfort, appears somewhat improved I have reviewed the patients home medicines and have made adjustments as needed   Social Determinants of Health:  Has primary care provider with plan to follow-up on November 1.   Test / Admission - Considered:  Admission considered however after comprehensive evaluation and work-up provided in the emergency room, no significant findings to explain patient's discomfort.   Somewhat improved with medications provided.  Plan to follow-up with primary care, provided with ambulatory referral to cardiology with strict return to ER precautions. Offered GI cocktail, consider if GERD could be causing her chest discomfort but has not otherwise been diagnosed tonight, patient declines, states this is not feel occurred.         Final Clinical Impression(s) / ED Diagnoses Final diagnoses:  Other chest pain  Chest pain, unspecified type    Rx / DC Orders ED Discharge Orders          Ordered    Ambulatory referral to Cardiology        05/27/22 1629              Tacy Learn, PA-C 05/27/22 2210    Charlesetta Shanks, MD 06/01/22 1549

## 2022-05-28 ENCOUNTER — Telehealth: Payer: Self-pay

## 2022-05-28 NOTE — Telephone Encounter (Signed)
Called pt to see if she went back to the ED but no answer.

## 2022-05-28 NOTE — Telephone Encounter (Signed)
Transition Care Management Follow-up Telephone Call Date of discharge and from where: 05/27/22 from [facility] for "Other chest pain" How have you been since you were released from the hospital? Pt states she feels SOB, but chest pain is better. From conversation on phone pt is has tachypnea & is having a hard time talking due to SOB. Pt states she was only sitting when the phone rang & she answered the phone sounding like that. Pt is advised to return to the ER due to new onset SOB. Pt c/o of having a hard time taking a deep breath at initial visit to ED on 05/27/22, but not this SOB. Imaging completed yesterday.  Any questions or concerns? No  Items Reviewed: Did the pt receive and understand the discharge instructions provided? Yes      Follow up appointments reviewed:  PCP Hospital f/u appt confirmed? No  Pt advised to return to ER & verb understanding. [facility] f/u appt confirmed? Yes  Scheduled to see Dr Harrell Gave on 06/21/22 @ 1040. Are transportation arrangements needed? No  If their condition worsens, is the pt aware to call PCP or go to the Emergency Dept.? Yes Was the patient provided with contact information for the PCP's office or ED? Yes Was to pt encouraged to call back with questions or concerns? Yes

## 2022-06-18 ENCOUNTER — Ambulatory Visit (INDEPENDENT_AMBULATORY_CARE_PROVIDER_SITE_OTHER): Payer: BC Managed Care – PPO | Admitting: Adult Health

## 2022-06-18 ENCOUNTER — Encounter: Payer: Self-pay | Admitting: Adult Health

## 2022-06-18 VITALS — BP 130/82 | HR 85 | Temp 98.7°F | Ht 66.75 in | Wt 284.0 lb

## 2022-06-18 DIAGNOSIS — E782 Mixed hyperlipidemia: Secondary | ICD-10-CM | POA: Diagnosis not present

## 2022-06-18 DIAGNOSIS — F419 Anxiety disorder, unspecified: Secondary | ICD-10-CM

## 2022-06-18 DIAGNOSIS — S76212A Strain of adductor muscle, fascia and tendon of left thigh, initial encounter: Secondary | ICD-10-CM

## 2022-06-18 DIAGNOSIS — Z6841 Body Mass Index (BMI) 40.0 and over, adult: Secondary | ICD-10-CM

## 2022-06-18 DIAGNOSIS — F5101 Primary insomnia: Secondary | ICD-10-CM

## 2022-06-18 DIAGNOSIS — H93A2 Pulsatile tinnitus, left ear: Secondary | ICD-10-CM | POA: Diagnosis not present

## 2022-06-18 DIAGNOSIS — D492 Neoplasm of unspecified behavior of bone, soft tissue, and skin: Secondary | ICD-10-CM | POA: Diagnosis not present

## 2022-06-18 DIAGNOSIS — F32A Depression, unspecified: Secondary | ICD-10-CM

## 2022-06-18 DIAGNOSIS — R5383 Other fatigue: Secondary | ICD-10-CM

## 2022-06-18 DIAGNOSIS — Z0000 Encounter for general adult medical examination without abnormal findings: Secondary | ICD-10-CM | POA: Diagnosis not present

## 2022-06-18 DIAGNOSIS — Z23 Encounter for immunization: Secondary | ICD-10-CM

## 2022-06-18 DIAGNOSIS — Z720 Tobacco use: Secondary | ICD-10-CM

## 2022-06-18 DIAGNOSIS — R0602 Shortness of breath: Secondary | ICD-10-CM

## 2022-06-18 DIAGNOSIS — I10 Essential (primary) hypertension: Secondary | ICD-10-CM

## 2022-06-18 DIAGNOSIS — R7303 Prediabetes: Secondary | ICD-10-CM | POA: Diagnosis not present

## 2022-06-18 LAB — COMPREHENSIVE METABOLIC PANEL
ALT: 24 U/L (ref 0–35)
AST: 29 U/L (ref 0–37)
Albumin: 4.3 g/dL (ref 3.5–5.2)
Alkaline Phosphatase: 92 U/L (ref 39–117)
BUN: 11 mg/dL (ref 6–23)
CO2: 27 mEq/L (ref 19–32)
Calcium: 9.4 mg/dL (ref 8.4–10.5)
Chloride: 103 mEq/L (ref 96–112)
Creatinine, Ser: 0.61 mg/dL (ref 0.40–1.20)
GFR: 102.87 mL/min (ref >60.00)
Glucose, Bld: 88 mg/dL (ref 70–99)
Potassium: 4.5 mEq/L (ref 3.5–5.1)
Sodium: 136 mEq/L (ref 135–145)
Total Bilirubin: 0.5 mg/dL (ref 0.2–1.2)
Total Protein: 7.3 g/dL (ref 6.0–8.3)

## 2022-06-18 LAB — LIPID PANEL
Cholesterol: 166 mg/dL (ref 0–200)
HDL: 63.6 mg/dL (ref >39.00)
LDL Cholesterol: 81 mg/dL (ref 0–99)
NonHDL: 102.73
Total CHOL/HDL Ratio: 3
Triglycerides: 109 mg/dL (ref 0.0–149.0)
VLDL: 21.8 mg/dL (ref 0.0–40.0)

## 2022-06-18 LAB — CBC WITH DIFFERENTIAL/PLATELET
Basophils Absolute: 0.1 10*3/uL (ref 0.0–0.1)
Basophils Relative: 0.9 % (ref 0.0–3.0)
Eosinophils Absolute: 0.1 10*3/uL (ref 0.0–0.7)
Eosinophils Relative: 1.4 % (ref 0.0–5.0)
HCT: 37.9 % (ref 36.0–46.0)
Hemoglobin: 12.3 g/dL (ref 12.0–15.0)
Lymphocytes Relative: 31 % (ref 12.0–46.0)
Lymphs Abs: 2.7 10*3/uL (ref 0.7–4.0)
MCHC: 32.4 g/dL (ref 30.0–36.0)
MCV: 87.9 fl (ref 78.0–100.0)
Monocytes Absolute: 0.6 10*3/uL (ref 0.1–1.0)
Monocytes Relative: 6.8 % (ref 3.0–12.0)
Neutro Abs: 5.3 10*3/uL (ref 1.4–7.7)
Neutrophils Relative %: 59.9 % (ref 43.0–77.0)
Platelets: 315 10*3/uL (ref 150.0–400.0)
RBC: 4.31 Mil/uL (ref 3.87–5.11)
RDW: 15.1 % (ref 11.5–15.5)
WBC: 8.8 10*3/uL (ref 4.0–10.5)

## 2022-06-18 LAB — TSH: TSH: 1.5 u[IU]/mL (ref 0.35–5.50)

## 2022-06-18 LAB — HEMOGLOBIN A1C: Hgb A1c MFr Bld: 5.9 % (ref 4.6–6.5)

## 2022-06-18 LAB — VITAMIN B12: Vitamin B-12: 263 pg/mL (ref 211–911)

## 2022-06-18 LAB — VITAMIN D 25 HYDROXY (VIT D DEFICIENCY, FRACTURES): VITD: 14.05 ng/mL — ABNORMAL LOW (ref 30.00–100.00)

## 2022-06-18 MED ORDER — METHOCARBAMOL 500 MG PO TABS: 500.0000 mg | ORAL_TABLET | Freq: Three times a day (TID) | ORAL | 0 refills | Status: AC

## 2022-06-18 MED ORDER — TEMAZEPAM 30 MG PO CAPS: ORAL_CAPSULE | ORAL | 2 refills | Status: DC

## 2022-06-18 MED ORDER — ROSUVASTATIN CALCIUM 10 MG PO TABS: 10.0000 mg | ORAL_TABLET | Freq: Every day | ORAL | 3 refills | Status: DC

## 2022-06-18 MED ORDER — LOSARTAN POTASSIUM 50 MG PO TABS: 50.0000 mg | ORAL_TABLET | Freq: Every day | ORAL | 3 refills | Status: DC

## 2022-06-18 NOTE — Progress Notes (Signed)
Subjective:    Patient ID: Holly Horn, female    DOB: 1971/02/11, 52 y.o.   MRN: [id number]  HPI Patient presents for yearly preventative medicine examination. She is a pleasant 51 year old female who  has a past medical history of Ectopic pregnancy, Essential hypertension, GERD (gastroesophageal reflux disease), and Hyperlipidemia.  Hyperlipidemia -managed with Crestor 10 mg daily.  She denies myalgia or fatigue Lab Results  Component Value Date   CHOL 138 07/29/2021   HDL 65.60 07/29/2021   LDLCALC 57 07/29/2021   TRIG 75.0 07/29/2021   CHOLHDL 2 07/29/2021   Hypertension-managed with Cozaar 50 mg daily.  She denies dizziness, lightheadedness, chest pain, or shortness of breath BP Readings from Last 3 Encounters:  06/18/22 130/82  05/27/22 138/77  01/05/22 (!) 144/100   Anxiety/depression-managed with Celexa 40 mg daily  Pre diabetes/Obesity -past she was on Trulicity 4.85 mg weekly and was eating healthier and starting to exercise.  She reports that she has not had any Trulicity since about April, she has quit eating healthy and quit exercising.  She is ready to make some changes and get back to being more healthy individual.  Wt Readings from Last 10 Encounters:  06/18/22 284 lb (128.8 kg)  05/27/22 274 lb 14.6 oz (124.7 kg)  01/05/22 275 lb (124.7 kg)  12/09/21 249 lb (112.9 kg)  11/06/21 279 lb (126.6 kg)  10/06/21 279 lb (126.6 kg)  09/01/21 288 lb 1.6 oz (130.7 kg)  07/29/21 292 lb (132.5 kg)  01/06/21 277 lb (125.6 kg)  12/02/20 275 lb 9.6 oz (125 kg)   Insomnia - takes Restoril 30 mg QHS- she feels as though she gets a good nights sleep   Tobacco Use -reports that she quit smoking after she was seen in the emergency room about a month ago for chest pain.  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  She would like to be seen by dermatology.  She denies any pain, itching, or drainage from this neoplasm  Fatigue - She constantly feels fatigued.  Becomes very short of breath with minimal exertion.  Her chest x-ray did show low lung volumes with bilateral atelectasis.  Left Groin Pain -the last 2 months she has had left-sided groin pain.  Pain is worse with movement such as change in position.  Can be tender to touch.  She denies any aggravating injury or trauma  Tinnitus -for the last few months she has had intermittent episodes of feeling her heartbeat in her left ear.  This can last quite a while.  Denies any ear drainage, pain, or station of fullness.  All immunizations and health maintenance protocols were reviewed with the patient and needed orders were placed. Flu shot today   Appropriate screening laboratory values were ordered for the patient including screening of hyperlipidemia, renal function and hepatic function.  Medication reconciliation,  past medical history, social history, problem list and allergies were reviewed in detail with the patient  Goals were established with regard to weight loss, exercise, and  diet in compliance with medications Wt Readings from Last 3 Encounters:  06/18/22 284 lb (128.8 kg)  05/27/22 274 lb 14.6 oz (124.7 kg)  01/05/22 275 lb (124.7 kg)     She is up to date on routine  colon cancer screening.   Review of Systems  Constitutional:  Positive for fatigue.  HENT: Negative.    Eyes: Negative.   Respiratory:  Positive for shortness of breath.   Cardiovascular: Negative.   Gastrointestinal: Negative.   Endocrine: Negative.   Genitourinary: Negative.   Musculoskeletal:  Positive for myalgias.  Skin: Negative.   Allergic/Immunologic: Negative.   Neurological: Negative.   Hematological: Negative.   Psychiatric/Behavioral: Negative.     Past Medical History:  Diagnosis Date   Ectopic pregnancy    x 3   Essential hypertension     GERD (gastroesophageal reflux disease)    Hyperlipidemia     Social History   Socioeconomic History   Marital status: Significant Other    Spouse name: Not on file   Number of children: Not on file   Years of education: Not on file   Highest education level: Bachelor's degree (e.g., BA, AB, BS)  Occupational History   Not on file  Tobacco Use   Smoking status: Every Day    Packs/day: 0.50    Types: Cigarettes   Smokeless tobacco: Never   Tobacco comments:    socially  Vaping Use   Vaping Use: Never used  Substance and Sexual Activity   Alcohol use: Yes    Alcohol/week: 12.0 standard drinks of alcohol    Types: 12 Standard drinks or equivalent per week   Drug use: No   Sexual activity: Not on file  Other Topics Concern   Not on file  Social History Narrative   Not on file   Social Determinants of Health   Financial Resource Strain: Low Risk  (09/01/2021)   Overall Financial Resource Strain (CARDIA)    Difficulty of Paying Living Expenses: Not very hard  Food Insecurity: No Food Insecurity (09/01/2021)   Hunger Vital Sign    Worried About Running Out of Food in the Last Year: Never true    Ran Out of Food in the Last Year: Never true  Transportation Needs: No Transportation Needs (09/01/2021)   PRAPARE - Hydrologist (Medical): No    Lack of Transportation (Non-Medical): No  Physical Activity: Insufficiently Active (09/01/2021)   Exercise Vital Sign    Days of Exercise per Week: 2 days    Minutes of Exercise per Session: 10 min  Stress: Stress Concern Present (09/01/2021)   [place]    Feeling of Stress : Rather much  Social Connections: Moderately Isolated (09/01/2021)   Social Connection and Isolation Panel [NHANES]    Frequency of Communication with Friends and Family: Three times a week    Frequency of Social Gatherings with Friends and Family: Never    Attends  Religious Services: Never    Marine scientist or Organizations: No    Attends Music therapist: Not on file    Marital Status: Living with partner  Intimate Partner Violence: Not on file    Past Surgical History:  Procedure Laterality Date   BIOPSY  01/06/2021   Procedure: BIOPSY;  Surgeon: Mansouraty, Telford Nab., MD;  Location: [facility];  Service: Gastroenterology;;   COLONOSCOPY  06/2020   ECTOPIC PREGNANCY SURGERY     ENDOSCOPIC MUCOSAL RESECTION  08/14/2020   Procedure: ENDOSCOPIC MUCOSAL RESECTION;  Surgeon: Irving Copas., MD;  Location: [place];  Service: Gastroenterology;;   ESOPHAGOGASTRODUODENOSCOPY N/A 01/06/2021   Procedure: ESOPHAGOGASTRODUODENOSCOPY (EGD);  Surgeon: Rush Landmark Telford Nab., MD;  Location: [facility];  Service: Gastroenterology;  Laterality: N/A;   EUS N/A 08/14/2020   Procedure: LOWER ENDOSCOPIC ULTRASOUND (EUS);  Surgeon: Irving Copas., MD;  Location: [place];  Service: Gastroenterology;  Laterality: N/A;   EUS N/A 01/06/2021   Procedure: UPPER ENDOSCOPIC ULTRASOUND (EUS) RADIAL;  Surgeon: Irving Copas., MD;  Location: [facility];  Service: Gastroenterology;  Laterality: N/A;   FINE NEEDLE ASPIRATION  01/06/2021   Procedure: FINE NEEDLE ASPIRATION (FNA) LINEAR;  Surgeon: Irving Copas., MD;  Location: [facility];  Service: Gastroenterology;;   Otho Darner SIGMOIDOSCOPY N/A 08/14/2020   Procedure: Beryle Quant;  Surgeon: Irving Copas., MD;  Location: [place];  Service: Gastroenterology;  Laterality: N/A;   HEMOSTASIS CLIP PLACEMENT  08/14/2020   Procedure: HEMOSTASIS CLIP PLACEMENT;  Surgeon: Irving Copas., MD;  Location: [place];  Service: Gastroenterology;;   SUBMUCOSAL LIFTING INJECTION  08/14/2020   Procedure: SUBMUCOSAL LIFTING INJECTION;  Surgeon: Irving Copas., MD;  Location[facility];  Service: Gastroenterology;;   [facility]   2008 & 2010    Family History  Problem Relation Age of Onset   Hypertension Mother    Osteoarthritis Father    Hypertension Brother    Cancer Maternal Grandmother        unknown   Cancer Maternal Grandfather        unknown   Cancer Paternal Grandmother        unknown   Cancer Paternal Grandfather        unknowon   Breast cancer Other    Colon cancer Neg Hx    Colon polyps Neg Hx    Esophageal cancer Neg Hx    Rectal cancer Neg Hx    Stomach cancer Neg Hx    Inflammatory bowel disease Neg Hx    Liver disease Neg Hx    Pancreatic cancer Neg Hx     Allergies  Allergen Reactions   Morphine Sulfate Shortness Of Breath and Nausea And Vomiting   Lisinopril Cough   Progesterone Nausea Only    Current Outpatient Medications on File Prior to Visit  Medication Sig Dispense Refill   citalopram (CELEXA) 40 MG tablet TAKE 1 TABLET BY MOUTH EVERY DAY 90 tablet 1   ibuprofen (ADVIL) 200 MG tablet Take 400 mg by mouth every 8 (eight) hours as needed (pain).     losartan (COZAAR) 50 MG tablet TAKE 1 TABLET BY MOUTH EVERY DAY (Patient taking differently: Take 50 mg by mouth daily.) 90 tablet 3   omeprazole (PRILOSEC) 20 MG capsule TAKE 1 CAPSULE BY MOUTH EVERY DAY 90 capsule 1   rosuvastatin (CRESTOR) 10 MG tablet Take 1 tablet (10 mg total) by mouth daily. 90 tablet 3   ondansetron (ZOFRAN-ODT) 8 MG disintegrating tablet Take 1 tablet (8 mg total) by mouth every 8 (eight) hours as needed for nausea or vomiting. (Patient not taking: Reported on 06/18/2022) 20 tablet 0   temazepam (RESTORIL) 30 MG capsule TAKE 1 CAPSULE BY MOUTH AT BEDTIME AS NEEDED FOR SLEEP. (Patient not taking: Reported on 06/18/2022) 30 capsule 2   No current facility-administered medications on file prior to visit.    BP 130/82   Pulse 85   Temp 98.7 F (37.1 C) (Oral)   Ht 5' 6.75" (1.695 m)   Wt 284 lb (128.8 kg)   LMP 08/23/2013   SpO2 98%   BMI 44.81 kg/m       Objective:   Physical Exam Vitals and  nursing note reviewed.  Constitutional:  General: She is not in acute distress.    Appearance: Normal appearance. She is well-developed. She is obese. She is not ill-appearing.  HENT:     Head: Normocephalic and atraumatic.     Right Ear: Tympanic membrane, ear canal and external ear normal. There is no impacted cerumen.     Left Ear: Tympanic membrane, ear canal and external ear normal. There is no impacted cerumen.     Nose: Nose normal. No congestion or rhinorrhea.     Mouth/Throat:     Mouth: Mucous membranes are moist.     Pharynx: Oropharynx is clear. No oropharyngeal exudate or posterior oropharyngeal erythema.  Eyes:     General:        Right eye: No discharge.        Left eye: No discharge.     Extraocular Movements: Extraocular movements intact.     Conjunctiva/sclera: Conjunctivae normal.     Pupils: Pupils are equal, round, and reactive to light.  Neck:     Thyroid: No thyromegaly.     Vascular: No carotid bruit.     Trachea: No tracheal deviation.  Cardiovascular:     Rate and Rhythm: Normal rate and regular rhythm.     Pulses: Normal pulses.     Heart sounds: Normal heart sounds. No murmur heard.    No friction rub. No gallop.  Pulmonary:     Effort: Pulmonary effort is normal. No respiratory distress.     Breath sounds: Normal breath sounds. No stridor. No wheezing, rhonchi or rales.  Chest:     Chest wall: No tenderness.  Abdominal:     General: Abdomen is flat. Bowel sounds are normal. There is no distension.     Palpations: Abdomen is soft. There is no mass.     Tenderness: There is no abdominal tenderness. There is no right CVA tenderness, left CVA tenderness, guarding or rebound.     Hernia: No hernia is present.  Musculoskeletal:        General: No swelling, tenderness, deformity or signs of injury. Normal range of motion.     Cervical back: Normal range of motion and neck supple.     Right lower leg: No edema.     Left lower leg: No edema.      Comments: Has muscular pain in left groin with knee-to-chest and internal and external rotation  Lymphadenopathy:     Cervical: No cervical adenopathy.  Skin:    General: Skin is warm and dry.     Coloration: Skin is not jaundiced or pale.     Findings: No bruising, erythema, lesion or rash.       Neurological:     General: No focal deficit present.     Mental Status: She is alert and oriented to person, place, and time.     Cranial Nerves: No cranial nerve deficit.     Sensory: No sensory deficit.     Motor: No weakness.     Coordination: Coordination normal.     Gait: Gait normal.     Deep Tendon Reflexes: Reflexes normal.  Psychiatric:        Mood and Affect: Mood normal.        Behavior: Behavior normal.        Thought Content: Thought content normal.        Judgment: Judgment normal.       Assessment & Plan:  1. Routine general medical examination at a health care facility  - Ambulatory  referral to Dermatology - CBC with Differential/Platelet; Future - Comprehensive metabolic panel; Future - Lipid panel; Future - TSH; Future - Hemoglobin A1c; Future - Hemoglobin A1c - TSH - Lipid panel - Comprehensive metabolic panel - CBC with Differential/Platelet  2. Need for immunization against influenza  - Flu Vaccine QUAD 83moIM (Fluarix, Fluzone & Alfiuria Quad PF)  3. Other fatigue - Likely type factorial related to obesity, sedentary lifestyle, possible sleep apnea.  Check labs, she was encouraged to start exercising and eat healthy.  Also referral to pulmonary for sleep apnea testing  - CBC with Differential/Platelet; Future - Comprehensive metabolic panel; Future - Lipid panel; Future - TSH; Future - Hemoglobin A1c; Future - Vitamin B12; Future - VITAMIN D 25 Hydroxy (Vit-D Deficiency, Fractures); Future - VITAMIN D 25 Hydroxy (Vit-D Deficiency, Fractures) - Vitamin B12 - Hemoglobin A1c - TSH - Lipid panel - Comprehensive metabolic panel - CBC with  Differential/Platelet - Ambulatory referral to Pulmonology  4. Pre-diabetes - Consider placing back on GLP1  - CBC with Differential/Platelet; Future - Comprehensive metabolic panel; Future - Lipid panel; Future - TSH; Future - Hemoglobin A1c; Future - Hemoglobin A1c - TSH - Lipid panel - Comprehensive metabolic panel - CBC with Differential/Platelet  5. Class 3 severe obesity with serious comorbidity and body mass index (BMI) of 40.0 to 44.9 in adult, unspecified obesity type ([place] - Consider WXAJOIN Needs to start back on exercise and eating healthy  - Follow up in 3 months to see how she is doing  - CBC with Differential/Platelet; Future - Comprehensive metabolic panel; Future - Lipid panel; Future - TSH; Future - Hemoglobin A1c; Future - Vitamin B12; Future - VITAMIN D 25 Hydroxy (Vit-D Deficiency, Fractures); Future - VITAMIN D 25 Hydroxy (Vit-D Deficiency, Fractures) - Vitamin B12 - Hemoglobin A1c - TSH - Lipid panel - Comprehensive metabolic panel - CBC with Differential/Platelet  6. Essential hypertension - Well controlled. No change in medications  - CBC with Differential/Platelet; Future - Comprehensive metabolic panel; Future - Lipid panel; Future - TSH; Future - Hemoglobin A1c; Future - Hemoglobin A1c - TSH - Lipid panel - Comprehensive metabolic panel - CBC with Differential/Platelet - losartan (COZAAR) 50 MG tablet; Take 1 tablet (50 mg total) by mouth daily.  Dispense: 90 tablet; Refill: 3  7. Primary insomnia  - temazepam (RESTORIL) 30 MG capsule; TAKE 1 CAPSULE BY MOUTH AT BEDTIME AS NEEDED FOR SLEEP.  Dispense: 30 capsule; Refill: 2  8. Mixed hyperlipidemia - Continue with Crestor  - CBC with Differential/Platelet; Future - Comprehensive metabolic panel; Future - Lipid panel; Future - TSH; Future - Hemoglobin A1c - TSH - Lipid panel - Comprehensive metabolic panel - CBC with Differential/Platelet - rosuvastatin (CRESTOR) 10 MG tablet;  Take 1 tablet (10 mg total) by mouth daily.  Dispense: 90 tablet; Refill: 3  9. Anxiety and depression - Continue with Celexa   10. Skin neoplasm - Referral to Dermatology   11. SOB (shortness of breath) on exertion - Concernm for COPD. Will refer to Pulmonary for PFT testing  - Ambulatory referral to Pulmonology  12. Tobacco use - Congratulated on quitting smoking   13. Pulsatile tinnitus of left ear  - [place]otid Duplex Bilateral; Future  14. Inguinal strain, left, initial encounter  - methocarbamol (ROBAXIN) 500 MG tablet; Take 1 tablet (500 mg total) by mouth 3 (three) times daily for 7 days.  Dispense: 21 tablet; Refill: 0 - advised stretching exercises  CDorothyann Peng NP

## 2022-06-18 NOTE — Patient Instructions (Signed)
It was great seeing you today   We will follow up with you regarding your lab work   Please let me know if you need anything   

## 2022-06-21 ENCOUNTER — Encounter (HOSPITAL_BASED_OUTPATIENT_CLINIC_OR_DEPARTMENT_OTHER): Payer: Self-pay | Admitting: Cardiology

## 2022-06-21 ENCOUNTER — Ambulatory Visit (INDEPENDENT_AMBULATORY_CARE_PROVIDER_SITE_OTHER): Payer: BC Managed Care – PPO | Admitting: Cardiology

## 2022-06-21 VITALS — BP 136/80 | HR 68 | Ht 66.75 in | Wt 291.2 lb

## 2022-06-21 DIAGNOSIS — E785 Hyperlipidemia, unspecified: Secondary | ICD-10-CM

## 2022-06-21 DIAGNOSIS — I10 Essential (primary) hypertension: Secondary | ICD-10-CM

## 2022-06-21 DIAGNOSIS — R0602 Shortness of breath: Secondary | ICD-10-CM

## 2022-06-21 DIAGNOSIS — R0683 Snoring: Secondary | ICD-10-CM

## 2022-06-21 DIAGNOSIS — R079 Chest pain, unspecified: Secondary | ICD-10-CM

## 2022-06-21 DIAGNOSIS — Z7189 Other specified counseling: Secondary | ICD-10-CM

## 2022-06-21 MED ORDER — METOPROLOL TARTRATE 25 MG PO TABS: ORAL_TABLET | ORAL | 0 refills | Status: DC

## 2022-06-21 NOTE — Patient Instructions (Addendum)
Medication Instructions:  TAKE METOPROLOL 25 MG 1 TABLET 2 HOURS PRIOR TO CT  Labwork: BMET TODAY   Testing/Procedures: Your physician has requested that you have cardiac CT. Cardiac computed tomography (CT) is a painless test that uses an x-ray machine to take clear, detailed pictures of your heart. For further information please visit [email or web address]. Please follow instruction sheet as given. THE OFFICE WILL CALL YOU TO ARRANGE ONCE YOUR INSURANCE HAS BEEN REVIEWED  HOME SLEEP STUDY ONCE INSURANCE HAS BEEN REVIEWED WILL CALL YOU WITH CODE   Your physician has requested that you have an echocardiogram. Echocardiography is a painless test that uses sound waves to create images of your heart. It provides your doctor with information about the size and shape of your heart and how well your heart's chambers and valves are working. This procedure takes approximately one hour. There are no restrictions for this procedure. Please do NOT wear cologne, perfume, aftershave, or lotions (deodorant is allowed). Please arrive 15 minutes prior to your appointment time.  Follow-Up: TO BE DETERMINED AFTER TESTING COMPLETED   Any Other Special Instructions Will Be Listed Below (If Applicable).    Your cardiac CT will be scheduled at one of the below locations:   [facility] [address] [place], [place] [address] ([address] [place] [address] [place], [place] [address] [phone]  [facility] [place], [place] [phone]  If scheduled at [facility], please arrive at the [facility] and Children's Entrance (Entrance C2) of [facility] 30 minutes prior to test start time. You can use the FREE valet parking offered at entrance C (encouraged to control the heart rate for the test)  Proceed to the [facility] Radiology Department (first floor)  to check-in and test prep.  All radiology patients and guests should use entrance C2 at [facility], accessed from [facility], even though the hospital's physical address listed is [address] [place] [address].    If scheduled at [facility] or [facility], please arrive 15 mins early for check-in and test prep.   Please follow these instructions carefully (unless otherwise directed):  Hold all erectile dysfunction medications at least 3 days (72 hrs) prior to test. (Ie viagra, cialis, sildenafil, tadalafil, etc) We will administer nitroglycerin during this exam.   On the Night Before the Test: Be sure to Drink plenty of water. Do not consume any caffeinated/decaffeinated beverages or chocolate 12 hours prior to your test. Do not take any antihistamines 12 hours prior to your test.  On the Day of the Test: Drink plenty of water until 1 hour prior to the test. Do not eat any food 1 hour prior to test. You may take your regular medications prior to the test.  Take metoprolol (Lopressor) two hours prior to test. HOLD Furosemide/Hydrochlorothiazide morning of the test. FEMALES- please wear underwire-free bra if available, avoid dresses & tight clothing  After the Test: Drink plenty of water. After receiving IV contrast, you may experience a mild flushed feeling. This is normal. On occasion, you may experience a mild rash up to 24 hours after the test. This is not dangerous. If this occurs, you can take Benadryl 25 mg and increase your fluid intake. If you experience trouble breathing, this can be serious. If it is severe call 911 IMMEDIATELY. If it is mild, please call our office.  If you take any of these medications: Glipizide/Metformin, Avandament, Glucavance, please do not take 48 hours after completing test unless otherwise instructed.  We will call to schedule your test 2-4 weeks out understanding that some insurance  companies will need an authorization prior to the service being performed.   For non-scheduling related questions, please contact the cardiac imaging nurse navigator should you have any questions/concerns: Marchia Bond, Cardiac Imaging Nurse Navigator Gordy Clement, Cardiac Imaging Nurse Navigator [facility] Direct Office Dial: [phone]   For scheduling needs, including cancellations and rescheduling, please call [place], [phone].  WatchPAT?  Is a FDA cleared portable home sleep study test that uses a watch and 3 points of contact to monitor 7 different channels, including your heart rate, oxygen saturations, body position, snoring, and chest motion.  The study is easy to use from the comfort of your own home and accurately detect sleep apnea.  Before bed, you attach the chest sensor, attached the sleep apnea bracelet to your nondominant hand, and attach the finger probe.  After the study, the raw data is downloaded from the watch and scored for apnea events.   For more information: [email or web address]  Patient Testing Instructions:  Do not put battery into the device until bedtime when you are ready to begin the test. Please call the support number if you need assistance after following the instructions below: 24 hour support line- [phone] or [facility] support at [phone] (option 2)  Download the [employer] One" app through the google play store or App Store  Be sure to turn on or enable access to bluetooth in settlings on your smartphone/ device  Make sure no other bluetooth devices are on and within the vicinity of your smartphone/ device and WatchPAT watch during testing.  Make sure to leave your smart phone/ device plugged in and charging all night.  When ready for bed:  Follow the instructions step by step in the WatchPAT One App to activate the testing device. For additional instructions, including video instruction,  visit the WatchPAT One video on Youtube. You can search for [place] One within Youtube (video is 4 minutes and 18 seconds) or enter: https://youtube/watch?v=[email or web address]_vbiwxE Please note: You will be prompted to enter a Pin to connect via bluetooth when starting the test. The PIN will be assigned to you when you receive the test.  The device is disposable, but it recommended that you retain the device until you receive a call letting you know the study has been received and the results have been interpreted.  We will let you know if the study did not transmit to [place] properly after the test is completed. You do not need to call us to confirm the receipt of the test.  Please complete the test within 48 hours of receiving PIN.   Frequently Asked Questions:  What is Watch [place] one?  A single use fully disposable home sleep apnea testing device and will not need to be returned after completion.  What are the requirements to use WatchPAT one?  The be able to have a successful watchpat one sleep study, you should have your Watch pat one device, your smart phone, watch pat one app, your PIN number and Internet access What type of phone do I need?  You should have a smart phone that uses Android 5.1 and above or any Iphone with IOS 10 and above How can I download the WatchPAT one app?  Based on your device type search for [facility]  one app either in google play for android devices or APP store for Iphone's Where will I get my PIN for the study?  Your PIN will be provided by your physician's office. It is used for authentication and if you lose/forget your PIN, please reach out to your providers office.  I do not have Internet at home. Can I do WatchPAT one study?  WatchPAT One needs Internet connection throughout the night to be able to transmit the sleep data. You can use your home/local internet or your cellular's data package. However, it is always recommended to use home/local Internet. It is estimated that  between 20MB-30MB will be used with each study.However, the application will be looking for 80MB space in the phone to start the study.  What happens if I lose internet or bluetooth connection?  During the internet disconnection, your phone will not be able to transmit the sleep data. All the data, will be stored in your phone. As soon as the internet connection is back on, the phone will being sending the sleep data. During the bluetooth disconnection, WatchPAT one will not be able to to send the sleep data to your phone. Data will be kept in the [facility] one until two devices have bluetooth connection back on. As soon as the connection is back on, WatchPAT one will send the sleep data to the phone.  How long do I need to wear the WatchPAT one?  After you start the study, you should wear the device at least 6 hours.  How far should I keep my phone from the device?  During the night, your phone should be within 15 feet.  What happens if I leave the room for restroom or other reasons?  Leaving the room for any reason will not cause any problem. As soon as your get back to the room, both devices will reconnect and will continue to send the sleep data. Can I use my phone during the sleep study?  Yes, you can use your phone as usual during the study. But it is recommended to put your watchpat one on when you are ready to go to bed.  How will I get my study results?  A soon as you completed your study, your sleep data will be sent to the provider. They will then share the results with you when they are ready.

## 2022-06-21 NOTE — Progress Notes (Signed)
Cardiology Office Note:    Date:  06/21/2022   ID:  Holly Horn, DOB 09/10/1970, MRN [id number]  PCP:  Dorothyann Peng, NP  Cardiologist:  Buford Dresser, MD  Referring MD: Tacy Learn, PA-C   CC: new patient consultation for chest pain  History of Present Illness:    Holly Horn is a 51 y.o. female with a hx of hypertension, hyperlipidemia, GERD, and ectopic pregnancy, who is seen as a new consult at the request of Tacy Learn, PA-C for the evaluation and management of chest pain.  On 05/27/2022 she presented to the ED with constant chest pain that woke her from sleep 12 hours prior. This was substernal with radiation to her left chest and her back. CT dissection study obtained and did not show any acute findings in the chest, abdomen, pelvis, negative for dissection.  Her troponins were negative x2. Her EKG did not suggest ischemic changes. She continued to have pain and was provided with injection of Toradol.  She reported ongoing discomfort but appeared more comfortable in bed, no longer clutching chest. She was discharged with ambulatory referral to cardiology.  Chest pain: -Initial onset: 05/27/22 she woke up from sleeping on the sofa with severe, central chest pain. She was unable to take a deep breath and found herself to be orthopneic. As she was getting ready for work her pain would not subside. -Quality: Her chest pains are distinguishable from her acid reflux discomfort and her anxiety. -Frequency: She has occasional left chest pains that have been ongoing for years. Her episode 05/2022 was distinct and centrally localized.  -Duration: The episode prompting her ED visit was persistent for hours. Her other chest pain is usually more brief. -Associated symptoms: Shortness of breath, orthopnea. Hospitalized for pneumonia as a child. -Aggravating/alleviating factors: Her occasional left chest pains are frequently resolved with applying pressure to the  area. -Prior ECG: On 05/27/22 showed NSR at 75 bpm. -Prior treatment:  For a couple years she was taking 81 mg ASA. 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 -Alcohol: Seltzers. -Tobacco: Has tried to cut back on her smoking. Typically she doesn't smoke unless she is having a drink. About 3 times a week she may have up to 5-6 seltzers and 4-5 cigarettes. -Comorbidities: Hypertension, Hyperlipidemia, GERD.  In clinic today her BP is 144/90, improved to 136/80 on recheck. At home it was 130/82 last Friday. -Exercise level: Sometimes when walking to office from parking lot she may feel lightheaded. In April she fell and received stitches in her left knee. For 2-3 months she had LLE discomfort in her thigh which concerned her as she reports a family history of blood clots. Generally she avoids climbing stairs. She complains of having no energy and she "stays winded." Lately she is working from a desk. Previously doing well on Trulicity, but it was stopped due to insurance issues. She had been losing weight on Trulicity and able to walk a bit further. -Cardiac ROS: +shortness of breath, no PND, +orthopnea, no LE edema, no syncope -Family history: Both of her parents died of CHF; her mother was 55 yo, father at 10 yo. Her father and her brother had a history of thrombi.  Previously she has had more significant LE edema. She states it is not bad today.  She endorses significant snoring but has not been tested for sleep apnea.  She denies any palpitations, syncope, or PND.  Past  Medical History:  Diagnosis Date   Ectopic pregnancy    x 3   Essential hypertension    GERD (gastroesophageal reflux disease)    Hyperlipidemia     Past Surgical History:  Procedure Laterality Date   BIOPSY  01/06/2021   Procedure: BIOPSY;  Surgeon: Irving Copas., MD;  Location: [facility];  Service: Gastroenterology;;   COLONOSCOPY   06/2020   ECTOPIC PREGNANCY SURGERY     ENDOSCOPIC MUCOSAL RESECTION  08/14/2020   Procedure: ENDOSCOPIC MUCOSAL RESECTION;  Surgeon: Irving Copas., MD;  Location: [place];  Service: Gastroenterology;;   ESOPHAGOGASTRODUODENOSCOPY N/A 01/06/2021   Procedure: ESOPHAGOGASTRODUODENOSCOPY (EGD);  Surgeon: Irving Copas., MD;  Location: [facility];  Service: Gastroenterology;  Laterality: N/A;   EUS N/A 08/14/2020   Procedure: LOWER ENDOSCOPIC ULTRASOUND (EUS);  Surgeon: Irving Copas., MD;  Location: [place];  Service: Gastroenterology;  Laterality: N/A;   EUS N/A 01/06/2021   Procedure: UPPER ENDOSCOPIC ULTRASOUND (EUS) RADIAL;  Surgeon: Irving Copas., MD;  Location: [facility];  Service: Gastroenterology;  Laterality: N/A;   FINE NEEDLE ASPIRATION  01/06/2021   Procedure: FINE NEEDLE ASPIRATION (FNA) LINEAR;  Surgeon: Irving Copas., MD;  Location: [facility];  Service: Gastroenterology;;   Otho Darner SIGMOIDOSCOPY N/A 08/14/2020   Procedure: Beryle Quant;  Surgeon: Irving Copas., MD;  Location: [place];  Service: Gastroenterology;  Laterality: N/A;   HEMOSTASIS CLIP PLACEMENT  08/14/2020   Procedure: HEMOSTASIS CLIP PLACEMENT;  Surgeon: Rush Landmark Telford Nab., MD;  Location: [place];  Service: Gastroenterology;;   SUBMUCOSAL LIFTING INJECTION  08/14/2020   Procedure: SUBMUCOSAL LIFTING INJECTION;  Surgeon: Irving Copas., MD;  Location: [place];  Service: Gastroenterology;;   [facility]  2008 & 2010    Current Medications: Current Outpatient Medications on File Prior to Visit  Medication Sig   citalopram (CELEXA) 40 MG tablet TAKE 1 TABLET BY MOUTH EVERY DAY   ibuprofen (ADVIL) 200 MG tablet Take 400 mg by mouth every 8 (eight) hours as needed (pain).   losartan (COZAAR) 50 MG tablet Take 1 tablet (50 mg total) by mouth daily.   methocarbamol (ROBAXIN) 500 MG tablet Take 1 tablet (500 mg  total) by mouth 3 (three) times daily for 7 days.   omeprazole (PRILOSEC) 20 MG capsule TAKE 1 CAPSULE BY MOUTH EVERY DAY   ondansetron (ZOFRAN-ODT) 8 MG disintegrating tablet Take 1 tablet (8 mg total) by mouth every 8 (eight) hours as needed for nausea or vomiting.   rosuvastatin (CRESTOR) 10 MG tablet Take 1 tablet (10 mg total) by mouth daily.   temazepam (RESTORIL) 30 MG capsule TAKE 1 CAPSULE BY MOUTH AT BEDTIME AS NEEDED FOR SLEEP.   No current facility-administered medications on file prior to visit.     Allergies:   Morphine sulfate, Lisinopril, and Progesterone   Social History   Tobacco Use   Smoking status: Every Day    Packs/day: 0.50    Types: Cigarettes   Smokeless tobacco: Never   Tobacco comments:    socially  Vaping Use   Vaping Use: Never used  Substance Use Topics   Alcohol use: Yes    Alcohol/week: 12.0 standard drinks of alcohol    Types: 12 Standard drinks or equivalent per week   Drug use: No    Family History: family history includes Breast cancer in an other family member; Cancer in her maternal grandfather, maternal grandmother, paternal grandfather, and paternal grandmother; Hypertension in her brother and mother; Osteoarthritis in  her father. There is no history of Colon cancer, Colon polyps, Esophageal cancer, Rectal cancer, Stomach cancer, Inflammatory bowel disease, Liver disease, or Pancreatic cancer.  ROS:   Please see the history of present illness.  Additional pertinent ROS: Constitutional: Negative for chills, fever, night sweats, unintentional weight loss  HENT: Negative for ear pain and hearing loss.   Eyes: Negative for loss of vision and eye pain.  Respiratory: Negative for cough, sputum, wheezing. Positive for snoring.  Cardiovascular: See HPI. Gastrointestinal: Negative for abdominal pain, melena, and hematochezia.  Genitourinary: Negative for dysuria and hematuria.  Musculoskeletal: Negative for myalgias. Positive for fall in  April. Skin: Negative for itching and rash.  Neurological: Negative for focal weakness, focal sensory changes and loss of consciousness. Endo/Heme/Allergies: Does not bruise easily.     EKGs/Labs/Other Studies Reviewed:    The following studies were reviewed today:  CTA Chest/Abd/Pel  05/27/2022: FINDINGS: CTA CHEST FINDINGS   Cardiovascular: Preferential opacification of the thoracic aorta. No evidence of thoracic aortic aneurysm or dissection. Normal heart size. No pericardial effusion.   Mediastinum/Nodes: No enlarged mediastinal, hilar, or axillary lymph nodes. Thyroid gland, trachea, and esophagus demonstrate no significant findings.   Lungs/Pleura: Lungs are clear. No pleural effusion or pneumothorax.   Musculoskeletal: No chest wall abnormality. No acute or significant osseous findings.   Review of the MIP images confirms the above findings.   CTA ABDOMEN AND PELVIS, VASCULAR FINDINGS Aorta: Normal caliber aorta without aneurysm, dissection, vasculitis or significant stenosis.   Celiac: Patent without evidence of aneurysm, dissection, vasculitis or significant stenosis.   SMA: Patent without evidence of aneurysm, dissection, vasculitis or significant stenosis.   Renals: Both renal arteries are patent without evidence of aneurysm, dissection, vasculitis, fibromuscular dysplasia or significant stenosis.   IMA: Patent without evidence of aneurysm, dissection, vasculitis or significant stenosis.   Inflow: Patent without evidence of aneurysm, dissection, vasculitis or significant stenosis.   Veins: No obvious venous abnormality within the limitations of this arterial phase study.   Review of the MIP images confirms the above findings.  IMPRESSION: No evidence of thoracic or abdominal aortic dissection or aneurysm.   No definite abnormality seen in the chest, abdomen or pelvis.   EKG:  EKG is personally reviewed.   06/21/2022:  SR with sinus arrhythmia at  68 bpm  Recent Labs: 06/18/2022: ALT 24; BUN 11; Creatinine, Ser 0.61; Hemoglobin 12.3; Platelets 315.0; Potassium 4.5; Sodium 136; TSH 1.50   Recent Lipid Panel    Component Value Date/Time   CHOL 166 06/18/2022 0954   TRIG 109.0 06/18/2022 0954   HDL 63.60 06/18/2022 0954   CHOLHDL 3 06/18/2022 0954   VLDL 21.8 06/18/2022 0954   LDLCALC 81 06/18/2022 0954   LDLCALC 138 (H) 05/02/2020 1106    Physical Exam:    VS:  BP 136/80 (BP Location: Right Arm, Patient Position: Sitting, Cuff Size: Large)   Pulse 68   Ht 5' 6.75" (1.695 m)   Wt 291 lb 3.2 oz (132.1 kg)   LMP 08/23/2013   BMI 45.95 kg/m     Wt Readings from Last 3 Encounters:  06/21/22 291 lb 3.2 oz (132.1 kg)  06/18/22 284 lb (128.8 kg)  05/27/22 274 lb 14.6 oz (124.7 kg)    GEN: Well nourished, well developed in no acute distress HEENT: Normal, moist mucous membranes NECK: No JVD CARDIAC: regular rhythm, normal S1 and S2, no rubs or gallops. No murmur. VASCULAR: Radial and DP pulses 2+ bilaterally. No carotid bruits RESPIRATORY:  Clear to auscultation without rales, wheezing or rhonchi  ABDOMEN: Soft, non-tender, non-distended MUSCULOSKELETAL:  Ambulates independently SKIN: Warm and dry, no edema NEUROLOGIC:  Alert and oriented x 3. No focal neuro deficits noted. PSYCHIATRIC:  Normal affect    ASSESSMENT:    1. Chest pain of uncertain etiology   2. SOB (shortness of breath)   3. Snoring   4. Essential hypertension   5. Hyperlipidemia, unspecified hyperlipidemia type   6. Cardiac risk counseling   7. Counseling on health promotion and disease prevention    PLAN:    Chest pain -discussed treadmill stress, nuclear stress/lexiscan, and CT coronary angiography. Discussed pros and cons of each, including but not limited to false positive/false negative risk, radiation risk, and risk of IV contrast dye. Based on shared decision making, decision was made to pursue CT coronary angiography. -will give one time  dose of metoprolol 2 hours prior to scheduled test -counseled on need to get BMET prior to test -counseled on use of sublingual nitroglycerin and its importance to a good test   Snoring, shortness of breath -ordered sleep study.   Hypertension -continue losartan  Hyperlipidemia -continue rosuvastatin  Cardiac risk counseling and prevention recommendations: -recommend heart healthy/Mediterranean diet, with whole grains, fruits, vegetable, fish, lean meats, nuts, and olive oil. Limit salt. -recommend moderate walking, 3-5 times/week for 30-50 minutes each session. Aim for at least 150 minutes.week. Goal should be pace of 3 miles/hours, or walking 1.5 miles in 30 minutes -recommend avoidance of tobacco products. Avoid excess alcohol. -ASCVD risk score: The 10-year ASCVD risk score (Arnett DK, et al., 2019) is: 4.1%   Values used to calculate the score:     Age: 65 years     Sex: Female     Is Non-Hispanic African American: No     Diabetic: No     Tobacco smoker: Yes     Systolic Blood Pressure: 355 mmHg     Is BP treated: Yes     HDL Cholesterol: 63.6 mg/dL     Total Cholesterol: 166 mg/dL    Plan for follow up: TBD based on results of testing, or sooner as needed.  Buford Dresser, MD, PhD, [facility]    Medication Adjustments/Labs and Tests Ordered: Current medicines are reviewed at length with the patient today.  Concerns regarding medicines are outlined above.   Orders Placed This Encounter  Procedures   CT CORONARY MORPH W/CTA COR W/SCORE W/CA W/CM &/OR WO/CM   Basic metabolic panel   EKG 97-CBUL   ECHOCARDIOGRAM COMPLETE   [facility] Sleep Study   Meds ordered this encounter  Medications   metoprolol tartrate (LOPRESSOR) 25 MG tablet    Sig: TAKE 1 TABLET 2 HOURS PRIOR TO CT    Dispense:  1 tablet    Refill:  0   Patient Instructions  Medication Instructions:  TAKE METOPROLOL 25 MG 1 TABLET 2 HOURS PRIOR TO CT  Labwork: BMET TODAY    Testing/Procedures: Your physician has requested that you have cardiac CT. Cardiac computed tomography (CT) is a painless test that uses an x-ray machine to take clear, detailed pictures of your heart. For further information please visit HugeFiesta.tn. Please follow instruction sheet as given. THE OFFICE WILL CALL YOU TO ARRANGE ONCE YOUR INSURANCE HAS BEEN REVIEWED  HOME SLEEP STUDY ONCE INSURANCE HAS BEEN REVIEWED WILL CALL YOU WITH CODE   Your physician has requested that you have an echocardiogram. Echocardiography is a painless test that uses sound  waves to create images of your heart. It provides your doctor with information about the size and shape of your heart and how well your heart's chambers and valves are working. This procedure takes approximately one hour. There are no restrictions for this procedure. Please do NOT wear cologne, perfume, aftershave, or lotions (deodorant is allowed). Please arrive 15 minutes prior to your appointment time.  Follow-Up: TO BE DETERMINED AFTER TESTING COMPLETED   Any Other Special Instructions Will Be Listed Below (If Applicable).    Your cardiac CT will be scheduled at one of the below locations:   [facility] [address] [place], [place] [address] (336) [place] [address] [place], [place] [address] [phone]  [facility] [place], [place] [address] [phone]  If scheduled at [facility], please arrive at the [facility] and Children's Entrance (Entrance C2) of [facility] 30 minutes prior to test start time. You can use the FREE valet parking offered at entrance C (encouraged to control the heart rate for the test)  Proceed to the [facility] Radiology Department (first floor) to check-in and test prep.  All radiology patients and guests should use entrance C2 at [facility], accessed from [facility], even though the hospital's physical address listed is [address]    If scheduled at [facility] or [facility], please arrive 15 mins early for check-in and test prep.   Please follow these instructions carefully (unless otherwise directed):  Hold all erectile dysfunction medications at least 3 days (72 hrs) prior to test. (Ie viagra, cialis, sildenafil, tadalafil, etc) We will administer nitroglycerin during this exam.   On the Night Before the Test: Be sure to Drink plenty of water. Do not consume any caffeinated/decaffeinated beverages or chocolate 12 hours prior to your test. Do not take any antihistamines 12 hours prior to your test.  On the Day of the Test: Drink plenty of water until 1 hour prior to the test. Do not eat any food 1 hour prior to test. You may take your regular medications prior to the test.  Take metoprolol (Lopressor) two hours prior to test. HOLD Furosemide/Hydrochlorothiazide morning of the test. FEMALES- please wear underwire-free bra if available, avoid dresses & tight clothing  After the Test: Drink plenty of water. After receiving IV contrast, you may experience a mild flushed feeling. This is normal. On occasion, you may experience a mild rash up to 24 hours after the test. This is not dangerous. If this occurs, you can take Benadryl 25 mg and increase your fluid intake. If you experience trouble breathing, this can be serious. If it is severe call 911 IMMEDIATELY. If it is mild, please call our office. If you take any of these medications: Glipizide/Metformin, Avandament, Glucavance, please do not take 48 hours after completing test unless otherwise instructed.  We will call to schedule your test 2-4 weeks out understanding that some insurance companies will need an authorization prior to the service being performed.   For non-scheduling related  questions, please contact the cardiac imaging nurse navigator should you have any questions/concerns: Marchia Bond, Cardiac Imaging Nurse Navigator Gordy Clement, Cardiac Imaging Nurse Navigator [facility] Direct Office Dial: [phone]   For scheduling needs, including cancellations and rescheduling, please call [place], [phone].  WatchPAT?  Is a FDA cleared  portable home sleep study test that uses a watch and 3 points of contact to monitor 7 different channels, including your heart rate, oxygen saturations, body position, snoring, and chest motion.  The study is easy to use from the comfort of your own home and accurately detect sleep apnea.  Before bed, you attach the chest sensor, attached the sleep apnea bracelet to your nondominant hand, and attach the finger probe.  After the study, the raw data is downloaded from the watch and scored for apnea events.   For more information: [email or web address]  Patient Testing Instructions:  Do not put battery into the device until bedtime when you are ready to begin the test. Please call the support number if you need assistance after following the instructions below: 24 hour support line- [phone] or [facility] support at [phone] (option 2)  Download the The First AmericanWatchPAT One" app through the google play store or App Store  Be sure to turn on or enable access to bluetooth in settlings on your smartphone/ device  Make sure no other bluetooth devices are on and within the vicinity of your smartphone/ device and WatchPAT watch during testing.  Make sure to leave your smart phone/ device plugged in and charging all night.  When ready for bed:  Follow the instructions step by step in the WatchPAT One App to activate the testing device. For additional instructions, including video instruction, visit the WatchPAT One video on Youtube. You can search for [place] One within Youtube (video is 4  minutes and 18 seconds) or enter: https://youtube/watch?v=BCce_vbiwxE Please note: You will be prompted to enter a Pin to connect via bluetooth when starting the test. The PIN will be assigned to you when you receive the test.  The device is disposable, but it recommended that you retain the device until you receive a call letting you know the study has been received and the results have been interpreted.  We will let you know if the study did not transmit to [place] properly after the test is completed. You do not need to call us to confirm the receipt of the test.  Please complete the test within 48 hours of receiving PIN.   Frequently Asked Questions:  What is Watch [place] one?  A single use fully disposable home sleep apnea testing device and will not need to be returned after completion.  What are the requirements to use WatchPAT one?  The be able to have a successful watchpat one sleep study, you should have your Watch pat one device, your smart phone, watch pat one app, your PIN number and Internet access What type of phone do I need?  You should have a smart phone that uses Android 5.1 and above or any Iphone with IOS 10 and above How can I download the WatchPAT one app?  Based on your device type search for WatchPAT one app either in google play for android devices or APP store for Iphone's Where will I get my PIN for the study?  Your PIN will be provided by your physician's office. It is used for authentication and if you lose/forget your PIN, please reach out to your providers office.  I do not have Internet at home. Can I do WatchPAT one study?  WatchPAT One needs Internet connection throughout the night to be able to transmit the sleep data. You can use your home/local internet or your cellular's data package. However, it is always recommended to use home/local Internet. It is estimated that between 20MB-30MB will  be used with each study.However, the application will be looking for 80MB space  in the phone to start the study.  What happens if I lose internet or bluetooth connection?  During the internet disconnection, your phone will not be able to transmit the sleep data. All the data, will be stored in your phone. As soon as the internet connection is back on, the phone will being sending the sleep data. During the bluetooth disconnection, WatchPAT one will not be able to to send the sleep data to your phone. Data will be kept in the [facility] one until two devices have bluetooth connection back on. As soon as the connection is back on, WatchPAT one will send the sleep data to the phone.  How long do I need to wear the WatchPAT one?  After you start the study, you should wear the device at least 6 hours.  How far should I keep my phone from the device?  During the night, your phone should be within 15 feet.  What happens if I leave the room for restroom or other reasons?  Leaving the room for any reason will not cause any problem. As soon as your get back to the room, both devices will reconnect and will continue to send the sleep data. Can I use my phone during the sleep study?  Yes, you can use your phone as usual during the study. But it is recommended to put your watchpat one on when you are ready to go to bed.  How will I get my study results?  A soon as you completed your study, your sleep data will be sent to the provider. They will then share the results with you when they are ready.     I,Mathew Stumpf,acting as a Education administrator for [employer], MD.,have documented all relevant documentation on the behalf of Buford Dresser, MD,as directed by  Buford Dresser, MD while in the presence of Buford Dresser, MD.  I, Buford Dresser, MD, have reviewed all documentation for this visit. The documentation on 06/30/22 for the exam, diagnosis, procedures, and orders are all accurate and complete.   Signed, Buford Dresser, MD PhD 06/21/2022     [place]

## 2022-06-22 ENCOUNTER — Telehealth: Payer: Self-pay | Admitting: *Deleted

## 2022-06-22 NOTE — Telephone Encounter (Signed)
Prior Authorization for [employer] sent to [employer]. Approval # [id number]. Valid dates 06/22/22 to 08/20/22. Staff message sent to Alvina Filbert ok to activate the device.

## 2022-06-22 NOTE — Telephone Encounter (Signed)
Advised patient ok to activate and code given ([id number])

## 2022-06-23 ENCOUNTER — Other Ambulatory Visit: Payer: Self-pay

## 2022-06-23 ENCOUNTER — Encounter (HOSPITAL_BASED_OUTPATIENT_CLINIC_OR_DEPARTMENT_OTHER): Payer: BC Managed Care – PPO | Admitting: Cardiology

## 2022-06-23 ENCOUNTER — Encounter: Payer: Self-pay | Admitting: Adult Health

## 2022-06-23 DIAGNOSIS — G4733 Obstructive sleep apnea (adult) (pediatric): Secondary | ICD-10-CM

## 2022-06-23 MED ORDER — TRULICITY 0.75 MG/0.5ML ~~LOC~~ SOAJ: SUBCUTANEOUS | 1 refills | Status: DC

## 2022-06-23 MED ORDER — VITAMIN D (ERGOCALCIFEROL) 1.25 MG (50000 UNIT) PO CAPS: 50000.0000 [IU] | ORAL_CAPSULE | ORAL | 0 refills | Status: AC

## 2022-06-23 NOTE — Addendum Note (Signed)
Addended by: Gwenyth Ober R on: 06/23/2022 03:39 PM   Modules accepted: Orders

## 2022-06-27 NOTE — Procedures (Signed)
SLEEP STUDY REPORT Patient Information Study Date: 06/23/22 Patient Name: Holly Horn Patient ID: [id number] Birth Date: [birth year] Age: 51 Gender: Female BMI: 45.7 (W=291 lb, H=5' 7'') Referring Physician: Buford Dresser, MD  TEST DESCRIPTION: Home sleep apnea testing was completed using the WatchPat, a Type 1 device, utilizing peripheral arterial tonometry (PAT), chest movement, actigraphy, pulse oximetry, pulse rate, body position and snore. AHI was calculated with apnea and hypopnea using valid sleep time as the denominator. RDI includes apneas, hypopneas, and RERAs. The data acquired and the scoring of sleep and all associated events were performed in accordance with the recommended standards and specifications as outlined in the AASM Manual for the Scoring of Sleep and Associated Events 2.2.0 (2015).   FINDINGS:   1. Mild Obstructive Sleep Apnea with AHI 9.4/hr.   2. No Central Sleep Apnea with pAHIc 0.7/hr.   3. Oxygen desaturations as low as 71%.   4. Severe snoring was present. O2 sats were < 88% for 0.5 min.   5. Total sleep time was 8 hrs and 25 min.   6. 16.9% of total sleep time was spent in REM sleep.   7. Normal sleep onset latency at 19 min.   8. Prolonged  REM sleep onset latency at 125 min.   9. Total awakenings were 16.  10. Arrhythmia detection:  None.  DIAGNOSIS: Mild Obstructive Sleep Apnea (G47.33)  RECOMMENDATIONS:   1.  Clinical correlation of these findings is necessary.  The decision to treat obstructive sleep apnea (OSA) is usually based on the presence of apnea symptoms or the presence of associated medical conditions such as Hypertension, Congestive Heart Failure, Atrial Fibrillation or Obesity.  The most common symptoms of OSA are snoring, gasping for breath while sleeping, daytime sleepiness and fatigue.   2.  Initiating apnea therapy is recommended given the presence of symptoms and/or associated conditions. Recommend proceeding  with one of the following:     a.  Auto-CPAP therapy with a pressure range of 5-20cm H2O.     b.  An oral appliance (OA) that can be obtained from certain dentists with expertise in sleep medicine.  These are primarily of use in non-obese patients with mild and moderate disease.     c.  An ENT consultation which may be useful to look for specific causes of obstruction and possible treatment options.     d.  If patient is intolerant to PAP therapy, consider referral to ENT for evaluation for hypoglossal nerve stimulator.   3.  Close follow-up is necessary to ensure success with CPAP or oral appliance therapy for maximum benefit.  4.  A follow-up oximetry study on CPAP is recommended to assess the adequacy of therapy and determine the need for supplemental oxygen or the potential need for Bi-level therapy.  An arterial blood gas to determine the adequacy of baseline ventilation and oxygenation should also be considered.  5.  Healthy sleep recommendations include:  adequate nightly sleep (normal 7-9 hrs/night), avoidance of caffeine after noon and alcohol near bedtime, and maintaining a sleep environment that is cool, dark and quiet.  6.  Weight loss for overweight patients is recommended.  Even modest amounts of weight loss can significantly improve the severity of sleep apnea.  7.  Snoring recommendations include:  weight loss where appropriate, side sleeping, and avoidance of alcohol before bed.  8.  Operation of motor vehicle should be avoided when sleepy.  Signature:   Fransico Him, MD; [facility]; Diplomat, American  Board of Sleep Medicine Electronically Signed: 06/27/2022

## 2022-06-28 ENCOUNTER — Other Ambulatory Visit: Payer: Self-pay | Admitting: Cardiology

## 2022-06-28 ENCOUNTER — Ambulatory Visit: Payer: No Typology Code available for payment source | Attending: Cardiology

## 2022-06-28 ENCOUNTER — Telehealth: Payer: Self-pay | Admitting: *Deleted

## 2022-06-28 DIAGNOSIS — G4736 Sleep related hypoventilation in conditions classified elsewhere: Secondary | ICD-10-CM

## 2022-06-28 DIAGNOSIS — G4733 Obstructive sleep apnea (adult) (pediatric): Secondary | ICD-10-CM

## 2022-06-28 DIAGNOSIS — R0683 Snoring: Secondary | ICD-10-CM

## 2022-06-28 NOTE — Telephone Encounter (Signed)
-----   Message from Sueanne Margarita, MD sent at 06/27/2022  4:12 PM EST ----- Please let patient know that they have sleep apnea and recommend treating with CPAP.  Please order an auto CPAP from 4-15cm H2O with heated humidity and mask of choice.  Order overnight pulse ox on CPAP.  Followup with me in 6 weeks.

## 2022-06-28 NOTE — Telephone Encounter (Signed)
Patient notified of sleep study results and recommendations. All questions were answered and she agrees to proceed with CPAP therapy. Orders were entered by Dr Radford Pax. [employer] [facility] notified orders are in EPIC.

## 2022-06-29 NOTE — Telephone Encounter (Signed)
Pt Rx was denied. Please advise of next step. Pt notified of update.

## 2022-06-30 ENCOUNTER — Encounter (HOSPITAL_BASED_OUTPATIENT_CLINIC_OR_DEPARTMENT_OTHER): Payer: Self-pay | Admitting: Cardiology

## 2022-07-05 ENCOUNTER — Ambulatory Visit
Admission: RE | Admit: 2022-07-05 | Discharge: 2022-07-05 | Disposition: A | Discharge status: Home / Self Care | Payer: BC Managed Care – PPO | Source: Ambulatory Visit | Attending: Adult Health | Admitting: Adult Health

## 2022-07-05 ENCOUNTER — Telehealth (HOSPITAL_COMMUNITY): Payer: Self-pay | Admitting: Emergency Medicine

## 2022-07-05 DIAGNOSIS — H93A2 Pulsatile tinnitus, left ear: Secondary | ICD-10-CM

## 2022-07-05 DIAGNOSIS — E785 Hyperlipidemia, unspecified: Secondary | ICD-10-CM | POA: Diagnosis not present

## 2022-07-05 DIAGNOSIS — I10 Essential (primary) hypertension: Secondary | ICD-10-CM | POA: Diagnosis not present

## 2022-07-05 NOTE — Telephone Encounter (Signed)
Attempted to call patient regarding upcoming cardiac CT appointment. °Left message on voicemail with name and callback number °Tykiera Raven RN Navigator Cardiac Imaging °[facility] °[phone] Office °[phone] Cell ° °

## 2022-07-06 ENCOUNTER — Ambulatory Visit (HOSPITAL_COMMUNITY)
Admission: RE | Admit: 2022-07-06 | Discharge: 2022-07-06 | Disposition: A | Discharge status: Home / Self Care | Payer: BC Managed Care – PPO | Source: Ambulatory Visit | Attending: Cardiology | Admitting: Cardiology

## 2022-07-06 ENCOUNTER — Other Ambulatory Visit: Payer: No Typology Code available for payment source

## 2022-07-06 DIAGNOSIS — R0602 Shortness of breath: Secondary | ICD-10-CM | POA: Insufficient documentation

## 2022-07-06 DIAGNOSIS — R079 Chest pain, unspecified: Secondary | ICD-10-CM | POA: Insufficient documentation

## 2022-07-06 MED ORDER — NITROGLYCERIN 0.4 MG SL SUBL
0.8000 mg | SUBLINGUAL_TABLET | Freq: Once | SUBLINGUAL | Status: AC
  Administered 2022-07-06: 0.8 mg via SUBLINGUAL

## 2022-07-06 MED ORDER — IOHEXOL 350 MG/ML SOLN
100.0000 mL | Freq: Once | INTRAVENOUS | Status: AC | PRN
  Administered 2022-07-06: 100 mL via INTRAVENOUS

## 2022-07-06 MED ORDER — NITROGLYCERIN 0.4 MG SL SUBL
SUBLINGUAL_TABLET | SUBLINGUAL | Status: AC
  Filled 2022-07-06: qty 2

## 2022-07-06 NOTE — Progress Notes (Incomplete)
Dr Gardiner Rhyme to read, GRA to over read 163m omnipaque 350 cp

## 2022-07-07 NOTE — Telephone Encounter (Signed)
Spoke to pt and advised that this was already done with an appeal advised pt that I can try to see if I can do another appeal.

## 2022-07-13 ENCOUNTER — Other Ambulatory Visit (HOSPITAL_BASED_OUTPATIENT_CLINIC_OR_DEPARTMENT_OTHER): Payer: Self-pay | Admitting: Family

## 2022-07-13 ENCOUNTER — Ambulatory Visit (INDEPENDENT_AMBULATORY_CARE_PROVIDER_SITE_OTHER): Payer: BC Managed Care – PPO

## 2022-07-13 ENCOUNTER — Other Ambulatory Visit (HOSPITAL_BASED_OUTPATIENT_CLINIC_OR_DEPARTMENT_OTHER): Payer: Self-pay

## 2022-07-13 DIAGNOSIS — E782 Mixed hyperlipidemia: Secondary | ICD-10-CM

## 2022-07-13 DIAGNOSIS — R079 Chest pain, unspecified: Secondary | ICD-10-CM | POA: Diagnosis not present

## 2022-07-13 DIAGNOSIS — R0602 Shortness of breath: Secondary | ICD-10-CM

## 2022-07-13 LAB — ECHOCARDIOGRAM COMPLETE
Area-P 1/2: 4.89 cm2
MV M vel: 3.79 m/s
MV Peak grad: 57.5 mmHg
S' Lateral: 3.59 cm

## 2022-07-13 MED ORDER — ASPIRIN 81 MG PO TBEC: 81.0000 mg | DELAYED_RELEASE_TABLET | Freq: Every day | ORAL | 3 refills | Status: AC

## 2022-07-13 MED ORDER — ROSUVASTATIN CALCIUM 10 MG PO TABS: 20.0000 mg | ORAL_TABLET | Freq: Every day | ORAL | 2 refills | Status: DC

## 2022-07-13 NOTE — Telephone Encounter (Signed)
Please advise. Insurance not covering rosuvastatin. Change requested.

## 2022-07-13 NOTE — Progress Notes (Signed)
Pt notified of CT results and instructions for follow up.  Pt verbalized understanding.  F/u appt scheduled with Dr Harrell Gave for 09/20/2022. Georgana Curio MHA RN CCM

## 2022-07-13 NOTE — Telephone Encounter (Signed)
Results of CT and Echocardiogram called to patient.  Instructions provided and patient verbalized understanding.  F/u appt scheduled with Dr. Harrell Gave in 2 months as per Laurann Montana.  Georgana Curio MHA RN CCM

## 2022-07-14 ENCOUNTER — Institutional Professional Consult (permissible substitution): Payer: No Typology Code available for payment source | Admitting: Pulmonary Disease

## 2022-07-15 DIAGNOSIS — G4733 Obstructive sleep apnea (adult) (pediatric): Secondary | ICD-10-CM | POA: Diagnosis not present

## 2022-07-21 DIAGNOSIS — Z130 Encounter for screening for diseases of the blood and blood-forming organs and certain disorders involving the immune mechanism: Secondary | ICD-10-CM | POA: Diagnosis not present

## 2022-07-21 DIAGNOSIS — Z1389 Encounter for screening for other disorder: Secondary | ICD-10-CM | POA: Diagnosis not present

## 2022-07-21 DIAGNOSIS — Z6841 Body Mass Index (BMI) 40.0 and over, adult: Secondary | ICD-10-CM | POA: Diagnosis not present

## 2022-07-21 DIAGNOSIS — Z1231 Encounter for screening mammogram for malignant neoplasm of breast: Secondary | ICD-10-CM | POA: Diagnosis not present

## 2022-07-21 DIAGNOSIS — Z01419 Encounter for gynecological examination (general) (routine) without abnormal findings: Secondary | ICD-10-CM | POA: Diagnosis not present

## 2022-08-15 DIAGNOSIS — G4733 Obstructive sleep apnea (adult) (pediatric): Secondary | ICD-10-CM | POA: Diagnosis not present

## 2022-08-18 ENCOUNTER — Telehealth: Payer: Self-pay | Admitting: Cardiology

## 2022-08-18 NOTE — Telephone Encounter (Signed)
Called patient to schedule sleep compliance appt, patient states that she is going to follow up with Pulmonary for her sleep. Just FYI

## 2022-08-19 ENCOUNTER — Encounter: Payer: Self-pay | Admitting: Pulmonary Disease

## 2022-08-19 ENCOUNTER — Ambulatory Visit (INDEPENDENT_AMBULATORY_CARE_PROVIDER_SITE_OTHER): Payer: BC Managed Care – PPO | Admitting: Pulmonary Disease

## 2022-08-19 VITALS — BP 138/68 | HR 89 | Ht 67.5 in | Wt 291.2 lb

## 2022-08-19 DIAGNOSIS — R0602 Shortness of breath: Secondary | ICD-10-CM | POA: Diagnosis not present

## 2022-08-19 DIAGNOSIS — G4733 Obstructive sleep apnea (adult) (pediatric): Secondary | ICD-10-CM | POA: Diagnosis not present

## 2022-08-19 NOTE — Progress Notes (Signed)
Is, like              Holly Horn    [id number]    September 07, 1970  Primary Care Physician:Nafziger, Tommi Rumps, NP  Referring Physician: Dorothyann Peng, NP [address],  [place] [address]  Chief complaint:   Patient being seen for shortness of breath History of obstructive sleep apnea  HPI:  Diagnosed with obstructive sleep apnea recently, has been using CPAP therapy Uses a nasal pillow, feels not working as well at present -Trying to get used to CPAP therapy -Discussed other options of treatment for sleep disordered breathing, and oral device would not be appropriate with significant desaturations  Denies significant shortness of breath but does feel aware about breathing when she is trying to get active  Usually goes to bed between 7:30 PM and 12 AM Takes about an hour to fall asleep 3-4 awakenings Final wake up time about 7 AM  Weight is up over time recently about 30 to 40 pounds up  On auto CPAP, feels she is getting too much air  History of hypertension, hypercholesterolemia  She did bartending in the past She is an active smoker about half a pack a day   Outpatient Encounter Medications as of 08/19/2022  Medication Sig   aspirin EC 81 MG tablet Take 1 tablet (81 mg total) by mouth daily. Swallow whole.   atorvastatin (LIPITOR) 20 MG tablet Take 1 tablet (20 mg total) by mouth daily.   citalopram (CELEXA) 40 MG tablet TAKE 1 TABLET BY MOUTH EVERY DAY   Dulaglutide (TRULICITY) 0.62 BJ/6.2GB SOPN 0.75 mg [place] q wk x 2 wks , then increase to 1.5 mg [place] if tolerating   ibuprofen (ADVIL) 200 MG tablet Take 400 mg by mouth every 8 (eight) hours as needed (pain).   losartan (COZAAR) 50 MG tablet Take 1 tablet (50 mg total) by mouth daily.   metoprolol tartrate (LOPRESSOR) 25 MG tablet TAKE 1 TABLET 2 HOURS PRIOR TO CT   omeprazole (PRILOSEC) 20 MG capsule TAKE 1 CAPSULE BY MOUTH EVERY DAY   ondansetron (ZOFRAN-ODT) 8 MG disintegrating tablet Take 1 tablet (8 mg  total) by mouth every 8 (eight) hours as needed for nausea or vomiting.   temazepam (RESTORIL) 30 MG capsule TAKE 1 CAPSULE BY MOUTH AT BEDTIME AS NEEDED FOR SLEEP.   Vitamin D, Ergocalciferol, (DRISDOL) 1.25 MG (50000 UNIT) CAPS capsule Take 1 capsule (50,000 Units total) by mouth every 7 (seven) days.   No facility-administered encounter medications on file as of 08/19/2022.    Allergies as of 08/19/2022 - Review Complete 08/19/2022  Allergen Reaction Noted   Morphine sulfate Shortness Of Breath and Nausea And Vomiting    Lisinopril Cough 03/01/2018   Progesterone Nausea Only     Past Medical History:  Diagnosis Date   Ectopic pregnancy    x 3   Essential hypertension    GERD (gastroesophageal reflux disease)    Hyperlipidemia     Past Surgical History:  Procedure Laterality Date   BIOPSY  01/06/2021   Procedure: BIOPSY;  Surgeon: Irving Copas., MD;  Location: [facility];  Service: Gastroenterology;;   COLONOSCOPY  06/2020   ECTOPIC PREGNANCY SURGERY     ENDOSCOPIC MUCOSAL RESECTION  08/14/2020   Procedure: ENDOSCOPIC MUCOSAL RESECTION;  Surgeon: Irving Copas., MD;  Location: [facility] ENDOSCOPY;  Service: Gastroenterology;;   ESOPHAGOGASTRODUODENOSCOPY N/A 01/06/2021   Procedure: ESOPHAGOGASTRODUODENOSCOPY (EGD);  Surgeon: Irving Copas., MD;  Location: [facility];  Service: Gastroenterology;  Laterality: N/A;   EUS N/A 08/14/2020   Procedure: LOWER ENDOSCOPIC ULTRASOUND (EUS);  Surgeon: Irving Copas., MD;  Location: [facility];  Service: Gastroenterology;  Laterality: N/A;   EUS N/A 01/06/2021   Procedure: UPPER ENDOSCOPIC ULTRASOUND (EUS) RADIAL;  Surgeon: Irving Copas., MD;  Location: [facility];  Service: Gastroenterology;  Laterality: N/A;   FINE NEEDLE ASPIRATION  01/06/2021   Procedure: FINE NEEDLE ASPIRATION (FNA) LINEAR;  Surgeon: Irving Copas., MD;  Location: [facility];  Service: Gastroenterology;;   Otho Darner  SIGMOIDOSCOPY N/A 08/14/2020   Procedure: Beryle Quant;  Surgeon: Irving Copas., MD;  Location: [facility];  Service: Gastroenterology;  Laterality: N/A;   HEMOSTASIS CLIP PLACEMENT  08/14/2020   Procedure: HEMOSTASIS CLIP PLACEMENT;  Surgeon: Rush Landmark Telford Nab., MD;  Location: [facility];  Service: Gastroenterology;;   SUBMUCOSAL LIFTING INJECTION  08/14/2020   Procedure: SUBMUCOSAL LIFTING INJECTION;  Surgeon: Irving Copas., MD;  Location[facility] END[facility]COPY;  Service: Gastroenterology;;   [facility]  2008 & 2010    Family History  Problem Relation Age of Onset   Hypertension Mother    Osteoarthritis Father    Hypertension Brother    Cancer Maternal Grandmother        unknown   Cancer Maternal Grandfather        unknown   Cancer Paternal Grandmother        unknown   Cancer Paternal Grandfather        unknowon   Breast cancer Other    Colon cancer Neg Hx    Colon polyps Neg Hx    Esophageal cancer Neg Hx    Rectal cancer Neg Hx    Stomach cancer Neg Hx    Inflammatory bowel disease Neg Hx    Liver disease Neg Hx    Pancreatic cancer Neg Hx     Social History   Socioeconomic History   Marital status: Significant Other    Spouse name: Not on file   Number of children: Not on file   Years of education: Not on file   Highest education level: Bachelor's degree (e.g., BA, AB, BS)  Occupational History   Not on file  Tobacco Use   Smoking status: Some Days    Packs/day: 0.50    Types: Cigarettes   Smokeless tobacco: Never   Tobacco comments:    Socially 3-4 times a week   Vaping Use   Vaping Use: Never used  Substance and Sexual Activity   Alcohol use: Yes    Alcohol/week: 12.0 standard drinks of alcohol    Types: 12 Standard drinks or equivalent per week   Drug use: No   Sexual activity: Not on file  Other Topics Concern   Not on file  Social History Narrative   Not on file   Social Determinants of Health   Financial  Resource Strain: Low Risk  (09/01/2021)   Overall Financial Resource Strain (CARDIA)    Difficulty of Paying Living Expenses: Not very hard  Food Insecurity: No Food Insecurity (09/01/2021)   Hunger Vital Sign    Worried About Running Out of Food in the Last Year: Never true    Ran Out of Food in the Last Year: Never true  Transportation Needs: No Transportation Needs (09/01/2021)   PRAPARE - Hydrologist (Medical): No    Lack of Transportation (Non-Medical): No  Physical Activity: Insufficiently Active (09/01/2021)   Exercise Vital Sign    Days of Exercise  per Week: 2 days    Minutes of Exercise per Session: 10 min  Stress: Stress Concern Present (09/01/2021)   [place]    Feeling of Stress : Rather much  Social Connections: Moderately Isolated (09/01/2021)   Social Connection and Isolation Panel [NHANES]    Frequency of Communication with Friends and Family: Three times a week    Frequency of Social Gatherings with Friends and Family: Never    Attends Religious Services: Never    Marine scientist or Organizations: No    Attends Music therapist: Not on file    Marital Status: Living with partner  Intimate Partner Violence: Not on file    Review of Systems  Respiratory:  Positive for shortness of breath.     Vitals:   08/19/22 0858  BP: 138/68  Pulse: 89  SpO2: 97%     Physical Exam Constitutional:      Appearance: She is obese.  HENT:     Head: Normocephalic.  Eyes:     General: No scleral icterus. Cardiovascular:     Rate and Rhythm: Normal rate and regular rhythm.     Heart sounds: No murmur heard. Pulmonary:     Effort: No respiratory distress.     Breath sounds: No stridor. No wheezing or rhonchi.  Musculoskeletal:     Cervical back: No rigidity or tenderness.  Neurological:     Mental Status: She is alert.  Psychiatric:        Mood and Affect:  Mood normal.    Data Reviewed: CT chest 05/27/2022 reviewed by myself-no evidence of infiltrative process  Assessment:  Obstructive sleep apnea -Trying to get used to using CPAP on a nightly basis  Discussed options of treatment for obstructive sleep apnea  Active smoker -Smoking cessation counseling  Shortness of breath with activity -  Plan/Recommendations: Schedule for pulmonary function test-to be done on day of next visit  Encouraged to continue using CPAP nightly  DME referral for CPAP supplies, patient will want to try a fullface mask  Graded exercises as tolerated  Encouraged aggressive weight loss measures  Follow-up in about 2 months  Encouraged to call with significant concerns   Sherrilyn Rist MD [facility] 08/19/2022, 9:37 AM  CC: Dorothyann Peng, NP

## 2022-08-19 NOTE — Patient Instructions (Addendum)
   breathing study at next Visit  Continue using your CPAP nightly  DME referral for CPAP supplies-fullface mask  Regular exercises as tolerated  Continue to work on trying to quit smoking  Weight loss efforts as tolerated  Call us with significant concerns  I will see you in about 2 months

## 2022-08-22 ENCOUNTER — Encounter: Payer: Self-pay | Admitting: Pulmonary Disease

## 2022-08-23 ENCOUNTER — Other Ambulatory Visit: Payer: Self-pay | Admitting: Adult Health

## 2022-08-23 DIAGNOSIS — K219 Gastro-esophageal reflux disease without esophagitis: Secondary | ICD-10-CM

## 2022-08-24 ENCOUNTER — Encounter: Payer: Self-pay | Admitting: Adult Health

## 2022-08-24 NOTE — Telephone Encounter (Signed)
Please advise 

## 2022-09-09 DIAGNOSIS — G4733 Obstructive sleep apnea (adult) (pediatric): Secondary | ICD-10-CM | POA: Diagnosis not present

## 2022-09-15 DIAGNOSIS — G4733 Obstructive sleep apnea (adult) (pediatric): Secondary | ICD-10-CM | POA: Diagnosis not present

## 2022-09-20 ENCOUNTER — Ambulatory Visit (HOSPITAL_BASED_OUTPATIENT_CLINIC_OR_DEPARTMENT_OTHER): Payer: BC Managed Care – PPO | Admitting: Cardiology

## 2022-09-30 ENCOUNTER — Ambulatory Visit (INDEPENDENT_AMBULATORY_CARE_PROVIDER_SITE_OTHER): Payer: BC Managed Care – PPO | Admitting: Cardiology

## 2022-09-30 ENCOUNTER — Encounter: Payer: Self-pay | Admitting: Adult Health

## 2022-09-30 ENCOUNTER — Encounter (HOSPITAL_BASED_OUTPATIENT_CLINIC_OR_DEPARTMENT_OTHER): Payer: Self-pay | Admitting: Cardiology

## 2022-09-30 VITALS — BP 140/100 | HR 60 | Ht 67.5 in | Wt 295.0 lb

## 2022-09-30 DIAGNOSIS — I10 Essential (primary) hypertension: Secondary | ICD-10-CM

## 2022-09-30 DIAGNOSIS — G4733 Obstructive sleep apnea (adult) (pediatric): Secondary | ICD-10-CM

## 2022-09-30 DIAGNOSIS — E7800 Pure hypercholesterolemia, unspecified: Secondary | ICD-10-CM

## 2022-09-30 DIAGNOSIS — I2510 Atherosclerotic heart disease of native coronary artery without angina pectoris: Secondary | ICD-10-CM

## 2022-09-30 DIAGNOSIS — Z6841 Body Mass Index (BMI) 40.0 and over, adult: Secondary | ICD-10-CM

## 2022-09-30 NOTE — Progress Notes (Signed)
Cardiology Office Note:    Date:  09/30/2022   ID:  Holly Horn, DOB 09-30-1970, MRN [id number]  PCP:  Holly Peng, NP  Cardiologist:  Buford Dresser, MD  Referring MD: Holly Peng, NP   CC: follow up  History of Present Illness:    Holly Horn is a 52 y.o. female with a hx of hypertension, hyperlipidemia, GERD, and ectopic pregnancy, who is seen for follow up today. I initially met her 06/21/22 as a new consult at the request of Holly Peng, NP for the evaluation and management of chest pain.  Today, the patient states that she has still been having difficulty sleeping with her cpap. She often turns and loses suction. The issues with her CPAP give her anxiety.   She has only had one recurrence of her prior chest pain. She describes it as slow onset and she was able to make it go away with just calming herself. She is still short of breath with activity but she attributes this to her weight. She denies any palpitations  Her blood pressure has still been high at home and is high today in clinic at 140/100 which she states is normal for her. The highest systolic pressure she has seen was 158, she denies any pressures lower than 100.  She is no longer covered by insurance for Trulicity, She takes all her medication in the morning. She has been taking her losartan but her blood pressure is still uncontrolled.  She denies any palpitations or peripheral edema. No lightheadedness, headaches, syncope, orthopnea, or PND.  Past Medical History:  Diagnosis Date   Ectopic pregnancy    x 3   Essential hypertension    GERD (gastroesophageal reflux disease)    Hyperlipidemia     Past Surgical History:  Procedure Laterality Date   BIOPSY  01/06/2021   Procedure: BIOPSY;  Surgeon: Irving Copas., MD;  Location: [facility];  Service: Gastroenterology;;   COLONOSCOPY  06/2020   ECTOPIC PREGNANCY SURGERY     ENDOSCOPIC MUCOSAL RESECTION  08/14/2020   Procedure:  ENDOSCOPIC MUCOSAL RESECTION;  Surgeon: Irving Copas., MD;  Location: [place];  Service: Gastroenterology;;   ESOPHAGOGASTRODUODENOSCOPY N/A 01/06/2021   Procedure: ESOPHAGOGASTRODUODENOSCOPY (EGD);  Surgeon: Irving Copas., MD;  Location: [facility];  Service: Gastroenterology;  Laterality: N/A;   EUS N/A 08/14/2020   Procedure: LOWER ENDOSCOPIC ULTRASOUND (EUS);  Surgeon: Irving Copas., MD;  Location: [place];  Service: Gastroenterology;  Laterality: N/A;   EUS N/A 01/06/2021   Procedure: UPPER ENDOSCOPIC ULTRASOUND (EUS) RADIAL;  Surgeon: Irving Copas., MD;  Location: [facility];  Service: Gastroenterology;  Laterality: N/A;   FINE NEEDLE ASPIRATION  01/06/2021   Procedure: FINE NEEDLE ASPIRATION (FNA) LINEAR;  Surgeon: Irving Copas., MD;  Location: [facility];  Service: Gastroenterology;;   Otho Darner SIGMOIDOSCOPY N/A 08/14/2020   Procedure: Beryle Quant;  Surgeon: Irving Copas., MD;  Location: [place];  Service: Gastroenterology;  Laterality: N/A;   HEMOSTASIS CLIP PLACEMENT  08/14/2020   Procedure: HEMOSTASIS CLIP PLACEMENT;  Surgeon: Irving Copas., MD;  Location: [place];  Service: Gastroenterology;;   SUBMUCOSAL LIFTING INJECTION  08/14/2020   Procedure: SUBMUCOSAL LIFTING INJECTION;  Surgeon: Irving Copas., MD;  Location: [place];  Service: Gastroenterology;;   [facility]  2008 & 2010    Current Medications: Current Outpatient Medications on File Prior to Visit  Medication Sig   aspirin EC 81 MG tablet Take 1 tablet (81 mg total) by  mouth daily. Swallow whole.   atorvastatin (LIPITOR) 20 MG tablet Take 40 mg by mouth daily.   citalopram (CELEXA) 40 MG tablet TAKE 1 TABLET BY MOUTH EVERY DAY   ibuprofen (ADVIL) 200 MG tablet Take 400 mg by mouth every 8 (eight) hours as needed (pain).   losartan (COZAAR) 50 MG tablet Take 1 tablet (50 mg total) by mouth daily.    omeprazole (PRILOSEC) 20 MG capsule TAKE 1 CAPSULE BY MOUTH EVERY DAY   ondansetron (ZOFRAN-ODT) 8 MG disintegrating tablet Take 1 tablet (8 mg total) by mouth every 8 (eight) hours as needed for nausea or vomiting.   temazepam (RESTORIL) 30 MG capsule TAKE 1 CAPSULE BY MOUTH AT BEDTIME AS NEEDED FOR SLEEP.   No current facility-administered medications on file prior to visit.     Allergies:   Morphine sulfate, Lisinopril, and Progesterone   Social History   Tobacco Use   Smoking status: Some Days    Packs/day: 0.50    Types: Cigarettes   Smokeless tobacco: Never   Tobacco comments:    Socially 3-4 times a week   Vaping Use   Vaping Use: Never used  Substance Use Topics   Alcohol use: Yes    Alcohol/week: 12.0 standard drinks of alcohol    Types: 12 Standard drinks or equivalent per week   Drug use: No    Family History: family history includes Breast cancer in an other family member; Cancer in her maternal grandfather, maternal grandmother, paternal grandfather, and paternal grandmother; Hypertension in her brother and mother; Osteoarthritis in her father. There is no history of Colon cancer, Colon polyps, Esophageal cancer, Rectal cancer, Stomach cancer, Inflammatory bowel disease, Liver disease, or Pancreatic cancer.  ROS:   Please see the history of present illness.  (+)Anxiety (+) Shortness of breath (+) Chest Pain (+) Fatigue Additional pertinent ROS otherwise unremarkable.   EKGs/Labs/Other Studies Reviewed:    The following studies were reviewed today:  CTA Chest/Abd/Pel  05/27/2022: CTA CHEST FINDINGS   Cardiovascular: Preferential opacification of the thoracic aorta. No evidence of thoracic aortic aneurysm or dissection. Normal heart size. No pericardial effusion.   Mediastinum/Nodes: No enlarged mediastinal, hilar, or axillary lymph nodes. Thyroid gland, trachea, and esophagus demonstrate no significant findings.   Lungs/Pleura: Lungs are clear. No  pleural effusion or pneumothorax.   Musculoskeletal: No chest wall abnormality. No acute or significant osseous findings.   IMPRESSION: No evidence of thoracic or abdominal aortic dissection or aneurysm.   No definite abnormality seen in the chest, abdomen or pelvis.  CT coronary 07/06/22 1. Coronary calcium score of 7. This was 86th percentile for age and sex matched control.   2. Normal coronary origin with right dominance.   3. Nonobstructive CAD, with calcified plaque in proximal LAD causing minimal (0-24%) stenosis   CAD-RADS 1. Minimal non-obstructive CAD (0-24%). Consider non-atherosclerotic causes of chest pain. Consider preventive therapy and risk factor modification.   EKG:  EKG is personally reviewed.   09/30/2022: The EKG is not reviewed 06/21/2022:  SR with sinus arrhythmia at 68 bpm  Recent Labs: 06/18/2022: ALT 24; BUN 11; Creatinine, Ser 0.61; Hemoglobin 12.3; Platelets 315.0; Potassium 4.5; Sodium 136; TSH 1.50   Recent Lipid Panel    Component Value Date/Time   CHOL 166 06/18/2022 0954   TRIG 109.0 06/18/2022 0954   HDL 63.60 06/18/2022 0954   CHOLHDL 3 06/18/2022 0954   VLDL 21.8 06/18/2022 0954   LDLCALC 81 06/18/2022 0954   LDLCALC 138 (H) 05/02/2020 1106  Physical Exam:    VS:  BP (!) 140/100 (BP Location: Right Arm, Patient Position: Sitting, Cuff Size: Large)   Pulse 60   Ht 5' 7.5" (1.715 m)   Wt 295 lb (133.8 kg)   LMP 08/23/2013   SpO2 97%   BMI 45.52 kg/m     Wt Readings from Last 3 Encounters:  09/30/22 295 lb (133.8 kg)  08/19/22 291 lb 3.2 oz (132.1 kg)  06/21/22 291 lb 3.2 oz (132.1 kg)    GEN: Well nourished, well developed in no acute distress HEENT: Normal, moist mucous membranes NECK: No JVD CARDIAC: regular rhythm, normal S1 and S2, no rubs or gallops. No murmur. VASCULAR: Radial and DP pulses 2+ bilaterally. No carotid bruits RESPIRATORY:  Clear to auscultation without rales, wheezing or rhonchi  ABDOMEN: Soft,  non-tender, non-distended MUSCULOSKELETAL:  Ambulates independently SKIN: Warm and dry, no edema NEUROLOGIC:  Alert and oriented x 3. No focal neuro deficits noted. PSYCHIATRIC:  Normal affect    ASSESSMENT:    1. Nonocclusive coronary atherosclerosis of native coronary artery   2. OSA (obstructive sleep apnea)   3. Class 3 severe obesity due to excess calories with serious comorbidity and body mass index (BMI) of 45.0 to 49.9 in adult ([place])   4. Essential hypertension   5. Pure hypercholesterolemia     PLAN:    Chest pain, largely resolved Nonobstructive CAD -LDL goal <70, on statin and aspirin  Sleep apnea -adjusting to CPAP  Hypertension -continue losartan, increasing dose to 100 mg daily. If BP still elevated, she will contact me and we will start amlodipine  Hyperlipidemia -finishing atorvastatin, then will transition to rosuvastatin  Obesity: -with cardiac comorbidities, would benefit from [facility]. She will see if Mancel Parsons is covered, as she does not have diabetes (Trulicity was not covered)  Cardiac risk counseling and prevention recommendations: -recommend heart healthy/Mediterranean diet, with whole grains, fruits, vegetable, fish, lean meats, nuts, and olive oil. Limit salt. -recommend moderate walking, 3-5 times/week for 30-50 minutes each session. Aim for at least 150 minutes.week. Goal should be pace of 3 miles/hours, or walking 1.5 miles in 30 minutes -recommend avoidance of tobacco products. Avoid excess alcohol.  Plan for follow up: 6 months  Buford Dresser, MD, PhD, [facility]    Medication Adjustments/Labs and Tests Ordered: Current medicines are reviewed at length with the patient today.  Concerns regarding medicines are outlined above.   No orders of the defined types were placed in this encounter.  No orders of the defined types were placed in this encounter.  Patient Instructions  Medication Instructions:  Increase  the losartan to 100 mg (two 50 mg pills of what you have) and then send me a mychart message in 1-2 weeks with readings. If it is still running high, we will add amlodipine.  *If you need a refill on your cardiac medications before your next appointment, please call your pharmacy*   Lab Work: None   Testing/Procedures: None   Follow-Up: At [facility], you and your health needs are our priority.  As part of our continuing mission to provide you with exceptional heart care, we have created designated Provider Care Teams.  These Care Teams include your primary Cardiologist (physician) and Advanced Practice Providers (APPs -  Physician Assistants and Nurse Practitioners) who all work together to provide you with the care you need, when you need it.  We recommend signing up for the patient portal called "MyChart".  Sign up  information is provided on this After Visit Summary.  MyChart is used to connect with patients for Virtual Visits (Telemedicine).  Patients are able to view lab/test results, encounter notes, upcoming appointments, etc.  Non-urgent messages can be sent to your provider as well.   To learn more about what you can do with MyChart, go to [email or web address].    Your next appointment:   6 month(s)  Sooner if needed  Provider:   Buford Dresser, MD    Other Instructions None      I,Coren O'Brien,acting as a scribe for Buford Dresser, MD.,have documented all relevant documentation on the behalf of Buford Dresser, MD,as directed by  Buford Dresser, MD while in the presence of Buford Dresser, MD.  I, Buford Dresser, MD, have reviewed all documentation for this visit. The documentation on 09/30/22 for the exam, diagnosis, procedures, and orders are all accurate and complete.

## 2022-09-30 NOTE — Patient Instructions (Addendum)
Medication Instructions:  Increase the losartan to 100 mg (two 50 mg pills of what you have) and then send me a mychart message in 1-2 weeks with readings. If it is still running high, we will add amlodipine.  *If you need a refill on your cardiac medications before your next appointment, please call your pharmacy*   Lab Work: None   Testing/Procedures: None   Follow-Up: At [facility], you and your health needs are our priority.  As part of our continuing mission to provide you with exceptional heart care, we have created designated Provider Care Teams.  These Care Teams include your primary Cardiologist (physician) and Advanced Practice Providers (APPs -  Physician Assistants and Nurse Practitioners) who all work together to provide you with the care you need, when you need it.  We recommend signing up for the patient portal called "MyChart".  Sign up information is provided on this After Visit Summary.  MyChart is used to connect with patients for Virtual Visits (Telemedicine).  Patients are able to view lab/test results, encounter notes, upcoming appointments, etc.  Non-urgent messages can be sent to your provider as well.   To learn more about what you can do with MyChart, go to [email or web address].    Your next appointment:   6 month(s)  Sooner if needed  Provider:   Buford Dresser, MD    Other Instructions None

## 2022-12-07 DIAGNOSIS — C4441 Basal cell carcinoma of skin of scalp and neck: Secondary | ICD-10-CM | POA: Diagnosis not present

## 2022-12-21 DIAGNOSIS — C4441 Basal cell carcinoma of skin of scalp and neck: Secondary | ICD-10-CM | POA: Diagnosis not present

## 2022-12-27 ENCOUNTER — Encounter: Payer: Self-pay | Admitting: Adult Health

## 2022-12-28 ENCOUNTER — Other Ambulatory Visit: Payer: Self-pay | Admitting: Adult Health

## 2022-12-28 ENCOUNTER — Encounter (HOSPITAL_BASED_OUTPATIENT_CLINIC_OR_DEPARTMENT_OTHER): Payer: Self-pay

## 2022-12-28 DIAGNOSIS — I10 Essential (primary) hypertension: Secondary | ICD-10-CM

## 2022-12-28 MED ORDER — LOSARTAN POTASSIUM 100 MG PO TABS: 100.0000 mg | ORAL_TABLET | Freq: Every day | ORAL | 2 refills | Status: DC

## 2022-12-28 NOTE — Telephone Encounter (Signed)
Please advise 

## 2023-01-11 ENCOUNTER — Encounter (HOSPITAL_BASED_OUTPATIENT_CLINIC_OR_DEPARTMENT_OTHER): Payer: Self-pay

## 2023-01-11 NOTE — Telephone Encounter (Signed)
BP log as requested  

## 2023-01-12 ENCOUNTER — Encounter: Payer: Self-pay | Admitting: Adult Health

## 2023-01-12 DIAGNOSIS — F5101 Primary insomnia: Secondary | ICD-10-CM

## 2023-01-12 DIAGNOSIS — F32A Depression, unspecified: Secondary | ICD-10-CM

## 2023-01-12 MED ORDER — TEMAZEPAM 30 MG PO CAPS: ORAL_CAPSULE | ORAL | 2 refills | Status: DC

## 2023-01-12 MED ORDER — CITALOPRAM HYDROBROMIDE 40 MG PO TABS: 40.0000 mg | ORAL_TABLET | Freq: Every day | ORAL | 1 refills | Status: DC

## 2023-02-21 ENCOUNTER — Other Ambulatory Visit: Payer: Self-pay | Admitting: Adult Health

## 2023-02-21 DIAGNOSIS — K219 Gastro-esophageal reflux disease without esophagitis: Secondary | ICD-10-CM

## 2023-03-24 ENCOUNTER — Other Ambulatory Visit (HOSPITAL_BASED_OUTPATIENT_CLINIC_OR_DEPARTMENT_OTHER): Payer: Self-pay | Admitting: Cardiology

## 2023-03-24 DIAGNOSIS — I10 Essential (primary) hypertension: Secondary | ICD-10-CM

## 2023-04-13 ENCOUNTER — Encounter (HOSPITAL_BASED_OUTPATIENT_CLINIC_OR_DEPARTMENT_OTHER): Payer: Self-pay | Admitting: Cardiology

## 2023-04-13 ENCOUNTER — Ambulatory Visit (INDEPENDENT_AMBULATORY_CARE_PROVIDER_SITE_OTHER): Payer: BC Managed Care – PPO | Admitting: Cardiology

## 2023-04-13 VITALS — BP 130/80 | HR 63 | Ht 67.5 in | Wt 300.4 lb

## 2023-04-13 DIAGNOSIS — G4733 Obstructive sleep apnea (adult) (pediatric): Secondary | ICD-10-CM

## 2023-04-13 DIAGNOSIS — E7800 Pure hypercholesterolemia, unspecified: Secondary | ICD-10-CM

## 2023-04-13 DIAGNOSIS — I2510 Atherosclerotic heart disease of native coronary artery without angina pectoris: Secondary | ICD-10-CM | POA: Diagnosis not present

## 2023-04-13 DIAGNOSIS — Z131 Encounter for screening for diabetes mellitus: Secondary | ICD-10-CM

## 2023-04-13 DIAGNOSIS — I10 Essential (primary) hypertension: Secondary | ICD-10-CM | POA: Diagnosis not present

## 2023-04-13 DIAGNOSIS — Z6841 Body Mass Index (BMI) 40.0 and over, adult: Secondary | ICD-10-CM

## 2023-04-13 DIAGNOSIS — R5383 Other fatigue: Secondary | ICD-10-CM

## 2023-04-13 DIAGNOSIS — R0602 Shortness of breath: Secondary | ICD-10-CM

## 2023-04-13 LAB — LIPID PANEL
Chol/HDL Ratio: 2.7 ratio (ref 0.0–4.4)
Cholesterol, Total: 195 mg/dL (ref 100–199)
HDL: 73 mg/dL (ref >39)
LDL Chol Calc (NIH): 100 mg/dL — ABNORMAL HIGH (ref 0–99)
Triglycerides: 125 mg/dL (ref 0–149)
VLDL Cholesterol Cal: 22 mg/dL (ref 5–40)

## 2023-04-13 LAB — HEMOGLOBIN A1C
Est. average glucose Bld gHb Est-mCnc: 123 mg/dL
Hgb A1c MFr Bld: 5.9 % — ABNORMAL HIGH (ref 4.8–5.6)

## 2023-04-13 NOTE — Progress Notes (Signed)
Cardiology Office Note:  .    Date:  04/13/2023  ID:  Holly Horn, DOB 12-25-1970, MRN [id number] PCP: Holly Frees, NP  [facility] Providers Cardiologist:  Holly Red, MD     History of Present Illness: .    Holly Horn is a 52 y.o. female with a hx of hypertension, hyperlipidemia, GERD, and ectopic pregnancy, who is seen for follow up today. I initially met her 06/21/22 as a new consult at the request of Holly Frees, NP for the evaluation and management of chest pain.   At her visit 09/2022, she noted only one recurrence of chest pain with a slow onset and resolved with calming herself. Also complained of DOE attributable to her weight. She was continuing to adjust to her CPAP with some difficulty. Her insurance was no longer covering Trulicity. Blood pressure in the office was 140/100 which was similar to her home readings on losartan. We increased her losartan to 100 mg daily. When finished with atorvastatin, planned to transition to rosuvastatin.  Today, she reports feeling fatigued. She has tried multiple mask types but remains intolerant of her CPAP device. Also she states she has gained some weight since her last visit and has low energy levels. She has tried Weight Watchers on 4 separate occasions. She has been trying to go on walks for exercise but is limited by her low energy. In the past when she was more active she could run up to 3 miles.  Sometimes she experiences chest heaviness and shortness of breath that is exacerbated by her anxiety. At times she will apply pressure to her chest which sometimes helps, especially when she is lying down at night.  Additionally notes being under significant stress at work. Her blood pressure is 130/80 in the office today. In the past her blood pressure has been as high as the 150s/90s.  Complains of frequent, loose stools. She is currently out of her metamucil for about a week. Metamucil usually helps her.    Currently she is taking two tablets of 20 mg atorvastatin daily (total 40 mg).  She denies any palpitations, peripheral edema, lightheadedness, headaches, syncope, orthopnea, or PND.  ROS:  Please see the history of present illness. ROS otherwise negative except as noted.  (+) Fatigue (+) Occasional chest heaviness and shortness of breath (+) Stress/Anxiety  Studies Reviewed: Marland Kitchen    EKG Interpretation Date/Time:  Wednesday April 13 2023 10:46:44 EDT Ventricular Rate:  63 PR Interval:  166 QRS Duration:  86 QT Interval:  416 QTC Calculation: 425 R Axis:   68  Text Interpretation: Normal sinus rhythm with sinus arrhythmia Normal ECG When compared with ECG of 27-May-2022 12:36, No significant change was found Confirmed by Holly Horn [phone]) on 04/13/2023 10:54:08 AM    Physical Exam:    VS:  BP 130/80   Pulse 63   Ht 5' 7.5" (1.715 m)   Wt (!) 300 lb 6.4 oz (136.3 kg)   LMP 08/23/2013   SpO2 96%   BMI 46.35 kg/m    Wt Readings from Last 3 Encounters:  04/13/23 (!) 300 lb 6.4 oz (136.3 kg)  09/30/22 295 lb (133.8 kg)  08/19/22 291 lb 3.2 oz (132.1 kg)    GEN: Well nourished, well developed in no acute distress HEENT: Normal, moist mucous membranes NECK: No JVD CARDIAC: regular rhythm, normal S1 and S2, no rubs or gallops. No murmur. VASCULAR: Radial and DP pulses 2+ bilaterally. No carotid bruits RESPIRATORY:  Clear to  auscultation without rales, wheezing or rhonchi  ABDOMEN: Soft, non-tender, non-distended MUSCULOSKELETAL:  Ambulates independently SKIN: Warm and dry, no edema NEUROLOGIC:  Alert and oriented x 3. No focal neuro deficits noted. PSYCHIATRIC:  Normal affect   ASSESSMENT AND PLAN: .    Chest pain Nonobstructive CAD -LDL goal <70, on statin and aspirin -reviewed Horn flag warning signs that need immediate medical attention   Sleep apnea -struggling with CPAP   Hypertension -continue losartan -If BP elevated, she will contact me and  we will start amlodipine   Hyperlipidemia -She is currently taking atorvastatin 40 mg daily. She is also receiving a prescription for rosuvastatin. We will use lipids to determine next steps.   Obesity: -with cardiac comorbidities, would benefit from [facility]. She will see if Reginal Lutes is covered, as she does not have diabetes (Trulicity was not covered) -Patient has a BMI of over 30 or over 27 with a weight related comorbidity -Current BMI 46.35 -Starting weight 300 lbs -Weight related comorbidities: sleep apnea, hypertension, hypercholesterolemia. -Has attempted weight loss with diet/exercise changes. Has tried weight watchers 4 times, has increased activity. Watching her food choices and portions; fiancee is a diabetic, has cut out high sugar drinks and foods. Has not been able to lose >5% of weight on their own. She is at her current highest weight ever. -Patient is medically appropriate for GLP treatment for weight loss. She has a contraindication to stimulants (phentermine, etc) with her hypertension. -discussed potential side effects, including GI discomfort (nausea, constipation, diarrhea). Discussed data re: pancreatitis. No known history of multiple endocrine neoplasia. She has tolerated trulicity in the past. -last A1c 5.9. Will recheck A1c as she is at risk for type II diabetes   Cardiac risk counseling and prevention recommendations: -recommend heart healthy/Mediterranean diet, with whole grains, fruits, vegetable, fish, lean meats, nuts, and olive oil. Limit salt. -recommend moderate walking, 3-5 times/week for 30-50 minutes each session. Aim for at least 150 minutes.week. Goal should be pace of 3 miles/hours, or walking 1.5 miles in 30 minutes -recommend avoidance of tobacco products. Avoid excess alcohol.  Dispo: Follow-up in 6 months, or sooner as needed.  I,Mathew Stumpf,acting as a Neurosurgeon for [employer], MD.,have documented all relevant documentation on the behalf of  Holly Red, MD,as directed by  Holly Red, MD while in the presence of Holly Red, MD.  I, Holly Red, MD, have reviewed all documentation for this visit. The documentation on 05/23/23 for the exam, diagnosis, procedures, and orders are all accurate and complete.   Signed, Holly Red, MD

## 2023-04-13 NOTE — Patient Instructions (Signed)
Medication Instructions:  The current medical regimen is effective;  continue present plan and medications.   *If you need a refill on your cardiac medications before your next appointment, please call your pharmacy*   Lab Work: Lipids, A1C  If you have labs (blood work) drawn today and your tests are completely normal, you will receive your results only by: MyChart Message (if you have MyChart) OR A paper copy in the mail If you have any lab test that is abnormal or we need to change your treatment, we will call you to review the results.   Testing/Procedures: None   Follow-Up: At [facility], you and your health needs are our priority.  As part of our continuing mission to provide you with exceptional heart care, we have created designated Provider Care Teams.  These Care Teams include your primary Cardiologist (physician) and Advanced Practice Providers (APPs -  Physician Assistants and Nurse Practitioners) who all work together to provide you with the care you need, when you need it.  We recommend signing up for the patient portal called "MyChart".  Sign up information is provided on this After Visit Summary.  MyChart is used to connect with patients for Virtual Visits (Telemedicine).  Patients are able to view lab/test results, encounter notes, upcoming appointments, etc.  Non-urgent messages can be sent to your provider as well.   To learn more about what you can do with MyChart, go to [email or web address].    Your next appointment:   6 month(s)  Provider:   Jodelle Red, MD    Other Instructions None

## 2023-05-17 ENCOUNTER — Other Ambulatory Visit: Payer: Self-pay

## 2023-05-17 ENCOUNTER — Telehealth: Payer: Self-pay | Admitting: Gastroenterology

## 2023-05-17 DIAGNOSIS — K862 Cyst of pancreas: Secondary | ICD-10-CM

## 2023-05-17 NOTE — Telephone Encounter (Signed)
Inbound call from patient stating she is having right sided abdominal pain. Patient is requesting a call to discuss. Please advise.

## 2023-05-17 NOTE — Telephone Encounter (Signed)
Lower EUS has been made for 07/25/23 at 12 noon at [facility] with GM  Left message on machine to call back

## 2023-05-17 NOTE — Telephone Encounter (Signed)
The pt would like to reschedule her Lower EUS for  rectal NET.  She has her insurance issues completed. She will bring in a copy of her new insurance as soon as she is able.   I will set up and call pt this week or next

## 2023-05-18 NOTE — Telephone Encounter (Signed)
Lower EUS scheduled, pt instructed and medications reviewed.  Patient instructions mailed to home.  Patient to call with any questions or concerns.

## 2023-05-25 ENCOUNTER — Encounter (HOSPITAL_BASED_OUTPATIENT_CLINIC_OR_DEPARTMENT_OTHER): Payer: Self-pay

## 2023-05-26 ENCOUNTER — Telehealth (HOSPITAL_BASED_OUTPATIENT_CLINIC_OR_DEPARTMENT_OTHER): Payer: Self-pay

## 2023-05-26 MED ORDER — ROSUVASTATIN CALCIUM 20 MG PO TABS
20.0000 mg | ORAL_TABLET | Freq: Every day | ORAL | 3 refills | Status: DC
Start: 2023-05-26 — End: 2024-06-20

## 2023-05-26 NOTE — Telephone Encounter (Addendum)
Seen by patient Holly Horn on 05/24/2023  7:13 PM; follow up mychart message sent to patient    ----- Message from [facility] sent at 05/23/2023  7:51 PM EDT ----- Her bad cholesterol (LDL) is 100, which is above the goal of less than 70. This is on atorvastatin 40 mg based on our records. Given that we are not at goal, I would stop the atorvastatin and change to rosuvastatin 20 mg daily and recheck lipids in 3 mos.

## 2023-06-21 ENCOUNTER — Ambulatory Visit (INDEPENDENT_AMBULATORY_CARE_PROVIDER_SITE_OTHER): Payer: BC Managed Care – PPO | Admitting: Adult Health

## 2023-06-21 VITALS — BP 136/84 | HR 75 | Temp 98.2°F | Ht 67.5 in | Wt 302.0 lb

## 2023-06-21 DIAGNOSIS — E782 Mixed hyperlipidemia: Secondary | ICD-10-CM

## 2023-06-21 DIAGNOSIS — G4733 Obstructive sleep apnea (adult) (pediatric): Secondary | ICD-10-CM

## 2023-06-21 DIAGNOSIS — F5101 Primary insomnia: Secondary | ICD-10-CM

## 2023-06-21 DIAGNOSIS — R7303 Prediabetes: Secondary | ICD-10-CM

## 2023-06-21 DIAGNOSIS — Z23 Encounter for immunization: Secondary | ICD-10-CM | POA: Diagnosis not present

## 2023-06-21 DIAGNOSIS — F419 Anxiety disorder, unspecified: Secondary | ICD-10-CM

## 2023-06-21 DIAGNOSIS — F32A Depression, unspecified: Secondary | ICD-10-CM

## 2023-06-21 DIAGNOSIS — E559 Vitamin D deficiency, unspecified: Secondary | ICD-10-CM

## 2023-06-21 DIAGNOSIS — Z720 Tobacco use: Secondary | ICD-10-CM

## 2023-06-21 DIAGNOSIS — Z6841 Body Mass Index (BMI) 40.0 and over, adult: Secondary | ICD-10-CM

## 2023-06-21 DIAGNOSIS — I10 Essential (primary) hypertension: Secondary | ICD-10-CM

## 2023-06-21 DIAGNOSIS — Z0000 Encounter for general adult medical examination without abnormal findings: Secondary | ICD-10-CM | POA: Diagnosis not present

## 2023-06-21 DIAGNOSIS — E66813 Obesity, class 3: Secondary | ICD-10-CM

## 2023-06-21 LAB — CBC
HCT: 37.7 % (ref 36.0–46.0)
Hemoglobin: 12.3 g/dL (ref 12.0–15.0)
MCHC: 32.6 g/dL (ref 30.0–36.0)
MCV: 88.4 fL (ref 78.0–100.0)
Platelets: 347 10*3/uL (ref 150.0–400.0)
RBC: 4.26 Mil/uL (ref 3.87–5.11)
RDW: 15.3 % (ref 11.5–15.5)
WBC: 9 10*3/uL (ref 4.0–10.5)

## 2023-06-21 LAB — BASIC METABOLIC PANEL
BUN: 10 mg/dL (ref 6–23)
CO2: 29 meq/L (ref 19–32)
Calcium: 9.7 mg/dL (ref 8.4–10.5)
Chloride: 100 meq/L (ref 96–112)
Creatinine, Ser: 0.61 mg/dL (ref 0.40–1.20)
GFR: 102.14 mL/min (ref >60.00)
Glucose, Bld: 98 mg/dL (ref 70–99)
Potassium: 4.3 meq/L (ref 3.5–5.1)
Sodium: 136 meq/L (ref 135–145)

## 2023-06-21 LAB — TSH: TSH: 1.2 u[IU]/mL (ref 0.35–5.50)

## 2023-06-21 LAB — VITAMIN D 25 HYDROXY (VIT D DEFICIENCY, FRACTURES): VITD: 27.98 ng/mL — ABNORMAL LOW (ref 30.00–100.00)

## 2023-06-21 MED ORDER — BUSPIRONE HCL 5 MG PO TABS
5.0000 mg | ORAL_TABLET | Freq: Two times a day (BID) | ORAL | 0 refills | Status: DC
Start: 2023-06-21 — End: 2023-07-14

## 2023-06-21 NOTE — Patient Instructions (Signed)
It was great seeing you today   We will follow up with you regarding your lab work   Please let me know if you need anything   I am going to add Buspar 5 mg twice daily to your regimen for Depression and Anxiety   Please follow up in one month

## 2023-06-21 NOTE — Progress Notes (Addendum)
Subjective:    Patient ID: Holly Horn, female    DOB: 1971-06-02, 52 y.o.   MRN: [id number]  HPI Patient presents for yearly preventative medicine examination. She is a 52 year old female who  has a past medical history of Ectopic pregnancy, Essential hypertension, GERD (gastroesophageal reflux disease), and Hyperlipidemia.  She was last seen a year ago for her CPE   Hyperlipidemia -managed with Crestor 20 mg daily.  She had a CT Calcium score was 7 in 06/2023  Lab Results  Component Value Date   CHOL 195 04/13/2023   HDL 73 04/13/2023   LDLCALC 100 (H) 04/13/2023   TRIG 125 04/13/2023   CHOLHDL 2.7 04/13/2023    Hypertension-managed with Cozaar 100 mg daily.  She denies dizziness, lightheadedness, chest pain, or shortness of breath BP Readings from Last 3 Encounters:  06/21/23 136/84  04/13/23 130/80  09/30/22 (!) 140/100   Anxiety/depression-managed with Celexa 40 mg daily.  She feels as though her anxiety and depression have become worse.  She is living a sedentary lifestyle, does not leave the house much after work and has no motivation to do anything.  She is unhappy with her weight gain and overall how she is living her life.  Pre diabetes/Obesity - Wt Readings from Last 10 Encounters:  06/21/23 (!) 302 lb (137 kg)  04/13/23 (!) 300 lb 6.4 oz (136.3 kg)  09/30/22 295 lb (133.8 kg)  08/19/22 291 lb 3.2 oz (132.1 kg)  06/21/22 291 lb 3.2 oz (132.1 kg)  06/18/22 284 lb (128.8 kg)  05/27/22 274 lb 14.6 oz (124.7 kg)  01/05/22 275 lb (124.7 kg)  12/09/21 249 lb (112.9 kg)  11/06/21 279 lb (126.6 kg)   Insomnia - takes Restoril 30 mg at bedtime PRN--   Tobacco Use  she restarted smoking.   OSA - she has tried multiple masks but remains intolerant to CPAP.    All immunizations and health maintenance protocols were reviewed with the patient and needed orders were placed.  Appropriate screening laboratory values were ordered for the patient including screening of  hyperlipidemia, renal function and hepatic function.   Medication reconciliation,  past medical history, social history, problem list and allergies were reviewed in detail with the patient  Goals were established with regard to weight loss, exercise, and  diet in compliance with medications  She has her mammogram and colonoscopy scheduled.    Review of Systems  Constitutional: Negative.   HENT: Negative.    Eyes: Negative.   Respiratory: Negative.    Cardiovascular: Negative.   Gastrointestinal: Negative.   Endocrine: Negative.   Genitourinary: Negative.   Musculoskeletal:  Positive for arthralgias and back pain.  Skin: Negative.   Allergic/Immunologic: Negative.   Neurological: Negative.   Hematological: Negative.   Psychiatric/Behavioral:  Positive for dysphoric mood and sleep disturbance. Negative for suicidal ideas. The patient is nervous/anxious.    Past Medical History:  Diagnosis Date   Ectopic pregnancy    x 3   Essential hypertension    GERD (gastroesophageal reflux disease)    Hyperlipidemia     Social History   Socioeconomic History   Marital status: Significant Other    Spouse name: Not on file   Number of children: Not on file   Years of education: Not on file   Highest education level: Bachelor's degree (e.g., BA, AB, BS)  Occupational History   Not on file  Tobacco Use   Smoking status: Some Days    Current  packs/day: 0.50    Types: Cigarettes   Smokeless tobacco: Never   Tobacco comments:    Socially 3-4 times a week   Vaping Use   Vaping status: Never Used  Substance and Sexual Activity   Alcohol use: Yes    Alcohol/week: 12.0 standard drinks of alcohol    Types: 12 Standard drinks or equivalent per week   Drug use: No   Sexual activity: Not on file  Other Topics Concern   Not on file  Social History Narrative   Not on file   Social Determinants of Health   Financial Resource Strain: Medium Risk (06/21/2023)   Overall Financial  Resource Strain (CARDIA)    Difficulty of Paying Living Expenses: Somewhat hard  Food Insecurity: No Food Insecurity (06/21/2023)   Hunger Vital Sign    Worried About Running Out of Food in the Last Year: Never true    Ran Out of Food in the Last Year: Never true  Transportation Needs: No Transportation Needs (06/21/2023)   PRAPARE - Administrator, Civil Service (Medical): No    Lack of Transportation (Non-Medical): No  Physical Activity: Unknown (06/21/2023)   Exercise Vital Sign    Days of Exercise per Week: 0 days    Minutes of Exercise per Session: Not on file  Stress: Stress Concern Present (06/21/2023)   [employer] of Occupational Health - Occupational Stress Questionnaire    Feeling of Stress : To some extent  Social Connections: Moderately Isolated (06/21/2023)   Social Connection and Isolation Panel [NHANES]    Frequency of Communication with Friends and Family: Three times a week    Frequency of Social Gatherings with Friends and Family: Once a week    Attends Religious Services: Never    Database administrator or Organizations: No    Attends Engineer, structural: Not on file    Marital Status: Living with partner  Intimate Partner Violence: Not on file    Past Surgical History:  Procedure Laterality Date   BIOPSY  01/06/2021   Procedure: BIOPSY;  Surgeon: Mansouraty, Netty Starring., MD;  Location: [facility];  Service: Gastroenterology;;   COLONOSCOPY  06/2020   ECTOPIC PREGNANCY SURGERY     ENDOSCOPIC MUCOSAL RESECTION  08/14/2020   Procedure: ENDOSCOPIC MUCOSAL RESECTION;  Surgeon: Lemar Lofty., MD;  Location[facility]COPY;  Service: Gastroenterology;;   ESOPHAGOGASTRODUODENOSCOPY N/A 01/06/2021   Procedure: ESOPHAGOGASTRODUODENOSCOPY (EGD);  Surgeon: Lemar Lofty., MD;  Location: [facility]OS[facility];  Service: Gastroenterology;  Laterality: N/A;   EUS N/A 08/14/2020   Procedure: LOWER ENDOSCOPIC ULTRASOUND (EUS);  Surgeon:  Lemar Lofty., MD;  Location: [facility] ENDOSCOPY;  Service: Gastroenterology;  Laterality: N/A;   EUS N/A 01/06/2021   Procedure: UPPER ENDOSCOPIC ULTRASOUND (EUS) RADIAL;  Surgeon: Lemar Lofty., MD;  Location: [facility] ENDOSCOPY;  Service: Gastroenterology;  Laterality: N/A;   FINE NEEDLE ASPIRATION  01/06/2021   Procedure: FINE NEEDLE ASPIRATION (FNA) LINEAR;  Surgeon: Lemar Lofty., MD;  Location: [facility]OS[facility];  Service: Gastroenterology;;   Wenda Low SIGMOIDOSCOPY N/A 08/14/2020   Procedure: Arnell Sieving;  Surgeon: Lemar Lofty., MD;  Location: [facility] ENDOSCOPY;  Service: Gastroenterology;  Laterality: N/A;   HEMOSTASIS CLIP PLACEMENT  08/14/2020   Procedure: HEMOSTASIS CLIP PLACEMENT;  Surgeon: Lemar Lofty., MD;  Location: [facility] ENDOSCOPY;  Service: Gastroenterology;;   SUBMUCOSAL LIFTING INJECTION  08/14/2020   Procedure: SUBMUCOSAL LIFTING INJECTION;  Surgeon: Lemar Lofty., MD;  Location: [facility] ENDOSCOPY;  Service: Gastroenterology;;  [facility] Surgery  2008 & 2010    Family History  Problem Relation Age of Onset   Hypertension Mother    Osteoarthritis Father    Hypertension Brother    Cancer Maternal Grandmother        unknown   Cancer Maternal Grandfather        unknown   Cancer Paternal Grandmother        unknown   Cancer Paternal Grandfather        unknowon   Breast cancer Other    Colon cancer Neg Hx    Colon polyps Neg Hx    Esophageal cancer Neg Hx    Rectal cancer Neg Hx    Stomach cancer Neg Hx    Inflammatory bowel disease Neg Hx    Liver disease Neg Hx    Pancreatic cancer Neg Hx     Allergies  Allergen Reactions   Morphine Sulfate Shortness Of Breath and Nausea And Vomiting   Lisinopril Cough   Progesterone Nausea Only    Current Outpatient Medications on File Prior to Visit  Medication Sig Dispense Refill   aspirin EC 81 MG tablet Take 1 tablet (81 mg total) by mouth daily. Swallow whole. 90 tablet 3    citalopram (CELEXA) 40 MG tablet Take 1 tablet (40 mg total) by mouth daily. 90 tablet 1   ibuprofen (ADVIL) 200 MG tablet Take 400 mg by mouth every 8 (eight) hours as needed (pain).     losartan (COZAAR) 100 MG tablet TAKE 1 TABLET BY MOUTH EVERY DAY 90 tablet 1   omeprazole (PRILOSEC) 20 MG capsule TAKE 1 CAPSULE BY MOUTH EVERY DAY 90 capsule 1   ondansetron (ZOFRAN-ODT) 8 MG disintegrating tablet Take 1 tablet (8 mg total) by mouth every 8 (eight) hours as needed for nausea or vomiting. 20 tablet 0   rosuvastatin (CRESTOR) 20 MG tablet Take 1 tablet (20 mg total) by mouth daily. 90 tablet 3   temazepam (RESTORIL) 30 MG capsule TAKE 1 CAPSULE BY MOUTH AT BEDTIME AS NEEDED FOR SLEEP. 30 capsule 2   No current facility-administered medications on file prior to visit.    BP 136/84   Pulse 75   Temp 98.2 F (36.8 C) (Oral)   Ht 5' 7.5" (1.715 m)   Wt (!) 302 lb (137 kg)   LMP 08/23/2013   SpO2 96%   BMI 46.60 kg/m       Objective:   Physical Exam Vitals and nursing note reviewed.  Constitutional:      General: She is not in acute distress.    Appearance: Normal appearance. She is obese. She is not ill-appearing.  HENT:     Head: Normocephalic and atraumatic.     Right Ear: Tympanic membrane, ear canal and external ear normal. There is no impacted cerumen.     Left Ear: Tympanic membrane, ear canal and external ear normal. There is no impacted cerumen.     Nose: Nose normal. No congestion or rhinorrhea.     Mouth/Throat:     Mouth: Mucous membranes are moist.     Pharynx: Oropharynx is clear.  Eyes:     Extraocular Movements: Extraocular movements intact.     Conjunctiva/sclera: Conjunctivae normal.     Pupils: Pupils are equal, round, and reactive to light.  Neck:     Vascular: No carotid bruit.  Cardiovascular:     Rate and Rhythm: Normal rate and regular rhythm.     Pulses: Normal pulses.  Heart sounds: No murmur heard.    No friction rub. No gallop.  Pulmonary:      Effort: Pulmonary effort is normal.     Breath sounds: Normal breath sounds.  Abdominal:     General: Abdomen is flat. Bowel sounds are normal. There is no distension.     Palpations: Abdomen is soft. There is no mass.     Tenderness: There is no abdominal tenderness. There is no guarding or rebound.     Hernia: No hernia is present.  Musculoskeletal:        General: Normal range of motion.     Cervical back: Normal range of motion and neck supple.  Lymphadenopathy:     Cervical: No cervical adenopathy.  Skin:    General: Skin is warm and dry.     Capillary Refill: Capillary refill takes less than 2 seconds.  Neurological:     General: No focal deficit present.     Mental Status: She is alert and oriented to person, place, and time.  Psychiatric:        Mood and Affect: Mood normal.        Behavior: Behavior normal.        Thought Content: Thought content normal.        Judgment: Judgment normal.       Assessment & Plan:  1. Routine general medical examination at a health care facility Today patient counseled on age appropriate routine health concerns for screening and prevention, each reviewed and up to date or declined. Immunizations reviewed and up to date or declined. Labs ordered and reviewed. Risk factors for depression reviewed and negative. Hearing function and visual acuity are intact. ADLs screened and addressed as needed. Functional ability and level of safety reviewed and appropriate. Education, counseling and referrals performed based on assessed risks today. Patient provided with a copy of personalized plan for preventive services. - Needs significant weight loss through diet and exercise - Follow up in one year for CPE; sooner fot acute issues - Quit smoking   2. Mixed hyperlipidemia -Lipid panel recently done by cardiology - CBC; Future - Basic Metabolic Panel; Future - TSH; Future - VITAMIN D 25 Hydroxy (Vit-D Deficiency, Fractures); Future - CBC - Basic  Metabolic Panel - TSH - VITAMIN D 25 Hydroxy (Vit-D Deficiency, Fractures)  3. Essential hypertension -Controlled.  No change in medication at this time - CBC; Future - Basic Metabolic Panel; Future - TSH; Future - VITAMIN D 25 Hydroxy (Vit-D Deficiency, Fractures); Future - CBC - Basic Metabolic Panel - TSH - VITAMIN D 25 Hydroxy (Vit-D Deficiency, Fractures)  4. Anxiety and depression Flowsheet Row Office Visit from 06/21/2023 in [facility] [place]  PHQ-9 Total Score 15         06/21/2023   10:58 AM  GAD 7 : Generalized Anxiety Score  Nervous, Anxious, on Edge 2  Control/stop worrying 1  Worry too much - different things 1  Trouble relaxing 1  Restless 0  Easily annoyed or irritable 0  Afraid - awful might happen 0  Total GAD 7 Score 5  Anxiety Difficulty Somewhat difficult     -Will add low-dose BuSpar 5 mg twice daily to her regimen.  Rexulti was not covered by her insurance.  Will have her follow-up in 30 days - CBC; Future - Basic Metabolic Panel; Future - TSH; Future - VITAMIN D 25 Hydroxy (Vit-D Deficiency, Fractures); Future - busPIRone (BUSPAR) 5 MG tablet; Take 1 tablet (5  mg total) by mouth 2 (two) times daily.  Dispense: 60 tablet; Refill: 0 - CBC - Basic Metabolic Panel - TSH - VITAMIN D 25 Hydroxy (Vit-D Deficiency, Fractures)  5. Pre-diabetes  - CBC; Future - Basic Metabolic Panel; Future - TSH; Future - VITAMIN D 25 Hydroxy (Vit-D Deficiency, Fractures); Future - CBC - Basic Metabolic Panel - TSH - VITAMIN D 25 Hydroxy (Vit-D Deficiency, Fractures)  6. Class 3 severe obesity with serious comorbidity and body mass index (BMI) of 40.0 to 44.9 in adult, unspecified obesity type [facility]) -Unfortunately her insurance does not cover Wegovy or Zepbound.  She had her A1c checked couple months ago at cardiology and it was 5.9. -Hoping if we can get a better handle on her depression that she will want to start exercising and  eating healthy  - CBC; Future - Basic Metabolic Panel; Future - TSH; Future - VITAMIN D 25 Hydroxy (Vit-D Deficiency, Fractures); Future - CBC - Basic Metabolic Panel - TSH - VITAMIN D 25 Hydroxy (Vit-D Deficiency, Fractures)  7. Primary insomnia - Continue with Restoril as needed - CBC; Future - Basic Metabolic Panel; Future - TSH; Future - VITAMIN D 25 Hydroxy (Vit-D Deficiency, Fractures); Future - CBC - Basic Metabolic Panel - TSH - VITAMIN D 25 Hydroxy (Vit-D Deficiency, Fractures)  8. Tobacco use - Encouraged to quit smoking  - CBC; Future - Basic Metabolic Panel; Future - TSH; Future - VITAMIN D 25 Hydroxy (Vit-D Deficiency, Fractures); Future - CBC - Basic Metabolic Panel - TSH - VITAMIN D 25 Hydroxy (Vit-D Deficiency, Fractures)  9. OSA (obstructive sleep apnea)  - CBC; Future - Basic Metabolic Panel; Future - TSH; Future - VITAMIN D 25 Hydroxy (Vit-D Deficiency, Fractures); Future - CBC - Basic Metabolic Panel - TSH - VITAMIN D 25 Hydroxy (Vit-D Deficiency, Fractures)  10. Need for influenza vaccination  - Flu vaccine trivalent PF, 6mos and older(Flulaval,Afluria,Fluarix,Fluzone)  11. Vitamin D deficiency  - VITAMIN D 25 Hydroxy (Vit-D Deficiency, Fractures); Future - VITAMIN D 25 Hydroxy (Vit-D Deficiency, Fractures)  Shirline Frees, NP

## 2023-06-28 ENCOUNTER — Encounter: Payer: Self-pay | Admitting: Adult Health

## 2023-07-14 ENCOUNTER — Other Ambulatory Visit: Payer: Self-pay | Admitting: Adult Health

## 2023-07-14 DIAGNOSIS — F32A Depression, unspecified: Secondary | ICD-10-CM

## 2023-07-19 ENCOUNTER — Encounter (HOSPITAL_COMMUNITY): Payer: Self-pay | Admitting: Gastroenterology

## 2023-07-21 ENCOUNTER — Ambulatory Visit: Payer: BC Managed Care – PPO | Admitting: Adult Health

## 2023-07-21 VITALS — BP 136/88 | HR 83 | Temp 97.9°F | Ht 67.5 in | Wt 301.0 lb

## 2023-07-21 DIAGNOSIS — F32A Depression, unspecified: Secondary | ICD-10-CM | POA: Diagnosis not present

## 2023-07-21 DIAGNOSIS — F419 Anxiety disorder, unspecified: Secondary | ICD-10-CM | POA: Diagnosis not present

## 2023-07-21 MED ORDER — BUSPIRONE HCL 15 MG PO TABS: 15.0000 mg | ORAL_TABLET | Freq: Two times a day (BID) | ORAL | 1 refills | Status: DC

## 2023-07-21 NOTE — Progress Notes (Signed)
Subjective:    Patient ID: Holly Horn, female    DOB: 10/23/1970, 52 y.o.   MRN: [id number]  HPI 52 year old female who  has a past medical history of Ectopic pregnancy, Essential hypertension, GERD (gastroesophageal reflux disease), and Hyperlipidemia.  She presents to the office today for follow-up regarding anxiety and depression.  She was seen a month ago she reported that her anxiety and depression had become worse.  She was living a sedentary lifestyle and did not like to leave the house much after work and had no motivation to do anything.  She was unhappy with her weight gain and overall how she is living her life.  At this time she was on Celexa 40 mg daily we added BuSpar 5 mg twice daily to her regimen.  Today she reports that she has been tolerating the Buspar pretty well but has not noticed much improvement in her anxiety and depression. She still feels like her anxiety and panic attacks are worse in the morning. Continue to live a pretty sedentary lifestyle   Review of Systems See HPI   Past Medical History:  Diagnosis Date   Ectopic pregnancy    x 3   Essential hypertension    GERD (gastroesophageal reflux disease)    Hyperlipidemia     Social History   Socioeconomic History   Marital status: Significant Other    Spouse name: Not on file   Number of children: Not on file   Years of education: Not on file   Highest education level: Bachelor's degree (e.g., BA, AB, BS)  Occupational History   Not on file  Tobacco Use   Smoking status: Some Days    Current packs/day: 0.50    Types: Cigarettes   Smokeless tobacco: Never   Tobacco comments:    Socially 3-4 times a week   Vaping Use   Vaping status: Never Used  Substance and Sexual Activity   Alcohol use: Yes    Alcohol/week: 12.0 standard drinks of alcohol    Types: 12 Standard drinks or equivalent per week   Drug use: No   Sexual activity: Not on file  Other Topics Concern   Not on file  Social  History Narrative   Not on file   Social Drivers of Health   Financial Resource Strain: Medium Risk (06/21/2023)   Overall Financial Resource Strain (CARDIA)    Difficulty of Paying Living Expenses: Somewhat hard  Food Insecurity: No Food Insecurity (06/21/2023)   Hunger Vital Sign    Worried About Running Out of Food in the Last Year: Never true    Ran Out of Food in the Last Year: Never true  Transportation Needs: No Transportation Needs (06/21/2023)   PRAPARE - Administrator, Civil Service (Medical): No    Lack of Transportation (Non-Medical): No  Physical Activity: Unknown (06/21/2023)   Exercise Vital Sign    Days of Exercise per Week: 0 days    Minutes of Exercise per Session: Not on file  Stress: Stress Concern Present (06/21/2023)   [employer] of Occupational Health - Occupational Stress Questionnaire    Feeling of Stress : To some extent  Social Connections: Moderately Isolated (06/21/2023)   Social Connection and Isolation Panel [NHANES]    Frequency of Communication with Friends and Family: Three times a week    Frequency of Social Gatherings with Friends and Family: Once a week    Attends Religious Services: Never    Active Member  of Clubs or Organizations: No    Attends Banker Meetings: Not on file    Marital Status: Living with partner  Intimate Partner Violence: Not on file    Past Surgical History:  Procedure Laterality Date   BIOPSY  01/06/2021   Procedure: BIOPSY;  Surgeon: Meridee Score Netty Starring., MD;  Location: [facility] ENDOSCOPY;  Service: Gastroenterology;;   COLONOSCOPY  06/2020   ECTOPIC PREGNANCY SURGERY     ENDOSCOPIC MUCOSAL RESECTION  08/14/2020   Procedure: ENDOSCOPIC MUCOSAL RESECTION;  Surgeon: Lemar Lofty., MD;  Location: [facility] ENDOSCOPY;  Service: Gastroenterology;;   ESOPHAGOGASTRODUODENOSCOPY N/A 01/06/2021   Procedure: ESOPHAGOGASTRODUODENOSCOPY (EGD);  Surgeon: Lemar Lofty., MD;  Location: [facility]  ENDOSCOPY;  Service: Gastroenterology;  Laterality: N/A;   EUS N/A 08/14/2020   Procedure: LOWER ENDOSCOPIC ULTRASOUND (EUS);  Surgeon: Lemar Lofty., MD;  Location: [facility] ENDOSCOPY;  Service: Gastroenterology;  Laterality: N/A;   EUS N/A 01/06/2021   Procedure: UPPER ENDOSCOPIC ULTRASOUND (EUS) RADIAL;  Surgeon: Lemar Lofty., MD;  Location: [facility] ENDOSCOPY;  Service: Gastroenterology;  Laterality: N/A;   FINE NEEDLE ASPIRATION  01/06/2021   Procedure: FINE NEEDLE ASPIRATION (FNA) LINEAR;  Surgeon: Lemar Lofty., MD;  Location: [facility] ENDOSCOPY;  Service: Gastroenterology;;   Wenda Low SIGMOIDOSCOPY N/A 08/14/2020   Procedure: Arnell Sieving;  Surgeon: Lemar Lofty., MD;  Location: [facility] ENDOSCOPY;  Service: Gastroenterology;  Laterality: N/A;   HEMOSTASIS CLIP PLACEMENT  08/14/2020   Procedure: HEMOSTASIS CLIP PLACEMENT;  Surgeon: Lemar Lofty., MD;  Location: [facility] ENDOSCOPY;  Service: Gastroenterology;;   SUBMUCOSAL LIFTING INJECTION  08/14/2020   Procedure: SUBMUCOSAL LIFTING INJECTION;  Surgeon: Lemar Lofty., MD;  Location: [facility] ENDOSCOPY;  Service: Gastroenterology;;   [facility]  2008 & 2010    Family History  Problem Relation Age of Onset   Hypertension Mother    Osteoarthritis Father    Hypertension Brother    Cancer Maternal Grandmother        unknown   Cancer Maternal Grandfather        unknown   Cancer Paternal Grandmother        unknown   Cancer Paternal Grandfather        unknowon   Breast cancer Other    Colon cancer Neg Hx    Colon polyps Neg Hx    Esophageal cancer Neg Hx    Rectal cancer Neg Hx    Stomach cancer Neg Hx    Inflammatory bowel disease Neg Hx    Liver disease Neg Hx    Pancreatic cancer Neg Hx     Allergies  Allergen Reactions   Morphine Sulfate Shortness Of Breath and Nausea And Vomiting   Lisinopril Cough   Progesterone Nausea Only    Current Outpatient Medications on File Prior to  Visit  Medication Sig Dispense Refill   aspirin EC 81 MG tablet Take 1 tablet (81 mg total) by mouth daily. Swallow whole. 90 tablet 3   citalopram (CELEXA) 40 MG tablet Take 1 tablet (40 mg total) by mouth daily. 90 tablet 1   ibuprofen (ADVIL) 200 MG tablet Take 400 mg by mouth every 8 (eight) hours as needed (pain).     losartan (COZAAR) 100 MG tablet TAKE 1 TABLET BY MOUTH EVERY DAY 90 tablet 1   omeprazole (PRILOSEC) 20 MG capsule TAKE 1 CAPSULE BY MOUTH EVERY DAY 90 capsule 1   ondansetron (ZOFRAN-ODT) 8 MG disintegrating tablet Take 1 tablet (8 mg total) by mouth every 8 (eight)  hours as needed for nausea or vomiting. 20 tablet 0   rosuvastatin (CRESTOR) 20 MG tablet Take 1 tablet (20 mg total) by mouth daily. 90 tablet 3   temazepam (RESTORIL) 30 MG capsule TAKE 1 CAPSULE BY MOUTH AT BEDTIME AS NEEDED FOR SLEEP. 30 capsule 2   No current facility-administered medications on file prior to visit.    BP 136/88   Pulse 83   Temp 97.9 F (36.6 C) (Oral)   Ht 5' 7.5" (1.715 m)   Wt (!) 301 lb (136.5 kg)   LMP 08/23/2013   SpO2 97%   BMI 46.45 kg/m       Objective:   Physical Exam Vitals and nursing note reviewed.  Constitutional:      Appearance: Normal appearance.  Cardiovascular:     Rate and Rhythm: Normal rate and regular rhythm.     Pulses: Normal pulses.     Heart sounds: Normal heart sounds.  Pulmonary:     Effort: Pulmonary effort is normal.     Breath sounds: Normal breath sounds.  Musculoskeletal:        General: Normal range of motion.  Skin:    General: Skin is warm and dry.  Neurological:     General: No focal deficit present.     Mental Status: She is alert and oriented to person, place, and time.  Psychiatric:        Mood and Affect: Mood normal.        Behavior: Behavior normal.        Thought Content: Thought content normal.        Judgment: Judgment normal.       Assessment & Plan:  1. Anxiety and depression (Primary) - There has been some  improvement in her depression score but GAD 7 has increased. I will increase Buspar to 15 mg BID and have her follow up in 30 days     07/21/2023    8:09 AM 06/21/2023   10:58 AM  GAD 7 : Generalized Anxiety Score  Nervous, Anxious, on Edge 1 2  Control/stop worrying 1 1  Worry too much - different things 1 1  Trouble relaxing 1 1  Restless 0 0  Easily annoyed or irritable 1 0  Afraid - awful might happen 2 0  Total GAD 7 Score 7 5  Anxiety Difficulty  Somewhat difficult       07/21/2023    8:09 AM 06/21/2023   10:58 AM 12/09/2021    2:09 PM  PHQ9 SCORE ONLY  PHQ-9 Total Score 11 15 9     - busPIRone (BUSPAR) 15 MG tablet; Take 1 tablet (15 mg total) by mouth 2 (two) times daily.  Dispense: 60 tablet; Refill: 1  Shirline Frees, NP

## 2023-07-25 ENCOUNTER — Ambulatory Visit (HOSPITAL_COMMUNITY): Payer: BC Managed Care – PPO

## 2023-07-25 ENCOUNTER — Ambulatory Visit (HOSPITAL_COMMUNITY)
Admission: RE | Admit: 2023-07-25 | Discharge: 2023-07-25 | Disposition: A | Discharge status: Home / Self Care | Payer: BC Managed Care – PPO | Attending: Gastroenterology | Admitting: Gastroenterology

## 2023-07-25 ENCOUNTER — Encounter (HOSPITAL_COMMUNITY): Payer: Self-pay | Admitting: Gastroenterology

## 2023-07-25 ENCOUNTER — Other Ambulatory Visit: Payer: Self-pay

## 2023-07-25 ENCOUNTER — Encounter (HOSPITAL_COMMUNITY)
Admission: RE | Disposition: A | Discharge status: Home / Self Care | Payer: Self-pay | Source: Home / Self Care | Attending: Gastroenterology

## 2023-07-25 ENCOUNTER — Telehealth: Payer: Self-pay

## 2023-07-25 DIAGNOSIS — Z09 Encounter for follow-up examination after completed treatment for conditions other than malignant neoplasm: Secondary | ICD-10-CM | POA: Diagnosis not present

## 2023-07-25 DIAGNOSIS — K862 Cyst of pancreas: Secondary | ICD-10-CM

## 2023-07-25 DIAGNOSIS — K649 Unspecified hemorrhoids: Secondary | ICD-10-CM | POA: Diagnosis not present

## 2023-07-25 DIAGNOSIS — D3A8 Other benign neuroendocrine tumors: Secondary | ICD-10-CM | POA: Diagnosis not present

## 2023-07-25 DIAGNOSIS — F418 Other specified anxiety disorders: Secondary | ICD-10-CM | POA: Diagnosis not present

## 2023-07-25 DIAGNOSIS — Z9889 Other specified postprocedural states: Secondary | ICD-10-CM

## 2023-07-25 DIAGNOSIS — K644 Residual hemorrhoidal skin tags: Secondary | ICD-10-CM

## 2023-07-25 DIAGNOSIS — I899 Noninfective disorder of lymphatic vessels and lymph nodes, unspecified: Secondary | ICD-10-CM

## 2023-07-25 DIAGNOSIS — F17210 Nicotine dependence, cigarettes, uncomplicated: Secondary | ICD-10-CM | POA: Insufficient documentation

## 2023-07-25 DIAGNOSIS — I10 Essential (primary) hypertension: Secondary | ICD-10-CM | POA: Insufficient documentation

## 2023-07-25 HISTORY — PX: FLEXIBLE SIGMOIDOSCOPY: SHX5431

## 2023-07-25 HISTORY — PX: EUS: SHX5427

## 2023-07-25 SURGERY — ULTRASOUND, LOWER GI TRACT, ENDOSCOPIC
Anesthesia: Monitor Anesthesia Care

## 2023-07-25 MED ORDER — PROPOFOL 10 MG/ML IV BOLUS
INTRAVENOUS | Status: AC
  Filled 2023-07-25: qty 20

## 2023-07-25 MED ORDER — SODIUM CHLORIDE 0.9 % IV SOLN: INTRAVENOUS | Status: DC

## 2023-07-25 MED ORDER — PROPOFOL 1000 MG/100ML IV EMUL
INTRAVENOUS | Status: AC
  Filled 2023-07-25: qty 100

## 2023-07-25 MED ORDER — PROPOFOL 500 MG/50ML IV EMUL
INTRAVENOUS | Status: DC | PRN
  Administered 2023-07-25: 125 ug/kg/min via INTRAVENOUS

## 2023-07-25 MED ORDER — PROPOFOL 10 MG/ML IV BOLUS
INTRAVENOUS | Status: DC | PRN
  Administered 2023-07-25 (×2): 50 mg via INTRAVENOUS
  Administered 2023-07-25: 120 mg via INTRAVENOUS
  Administered 2023-07-25: 60 mg via INTRAVENOUS
  Administered 2023-07-25: 20 mg via INTRAVENOUS

## 2023-07-25 NOTE — Anesthesia Procedure Notes (Addendum)
Procedure Name: MAC Date/Time: 07/25/2023 1:05 PM  Performed by: Maurene Capes, CRNAPre-anesthesia Checklist: Patient identified, Emergency Drugs available, Patient being monitored, Timeout performed and Suction available Patient Re-evaluated:Patient Re-evaluated prior to induction Oxygen Delivery Method: Nasal cannula Preoxygenation: Pre-oxygenation with 100% oxygen Induction Type: IV induction Placement Confirmation: positive ETCO2 Dental Injury: Teeth and Oropharynx as per pre-operative assessment

## 2023-07-25 NOTE — Telephone Encounter (Signed)
Recall has been entered as ordered  

## 2023-07-25 NOTE — H&P (Signed)
GASTROENTEROLOGY PROCEDURE H&P NOTE   Primary Care Physician: Shirline Frees, NP  HPI: Holly Horn is a 52 y.o. 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   Past Medical History:  Diagnosis Date   Ectopic pregnancy    x 3   Essential hypertension    GERD (gastroesophageal reflux disease)    Hyperlipidemia    Past Surgical History:  Procedure Laterality Date   BIOPSY  01/06/2021   Procedure: BIOPSY;  Surgeon: Lemar Lofty., MD;  Location: [facility] ENDOSCOPY;  Service: Gastroenterology;;   COLONOSCOPY  06/2020   ECTOPIC PREGNANCY SURGERY     ENDOSCOPIC MUCOSAL RESECTION  08/14/2020   Procedure: ENDOSCOPIC MUCOSAL RESECTION;  Surgeon: Lemar Lofty., MD;  Location: [facility] ENDOSCOPY;  Service: Gastroenterology;;   ESOPHAGOGASTRODUODENOSCOPY N/A 01/06/2021   Procedure: ESOPHAGOGASTRODUODENOSCOPY (EGD);  Surgeon: Lemar Lofty., MD;  Location: [facility] END[facility]COPY;  Service: Gastroenterology;  Laterality: N/A;   EUS N/A 08/14/2020   Procedure: LOWER ENDOSCOPIC ULTRASOUND (EUS);  Surgeon: Lemar Lofty., MD;  Location: [facility] ENDOSCOPY;  Service: Gastroenterology;  Laterality: N/A;   EUS N/A 01/06/2021   Procedure: UPPER ENDOSCOPIC ULTRASOUND (EUS) RADIAL;  Surgeon: Lemar Lofty., MD;  Location: [facility] ENDOSCOPY;  Service: Gastroenterology;  Laterality: N/A;   FINE NEEDLE ASPIRATION  01/06/2021   Procedure: FINE NEEDLE ASPIRATION (FNA) LINEAR;  Surgeon: Lemar Lofty., MD;  Location: [facility] ENDOSCOPY;  Service: Gastroenterology;;   Wenda Low SIGMOIDOSCOPY N/A 08/14/2020   Procedure: Arnell Sieving;  Surgeon: Lemar Lofty., MD;  Location: [facility] ENDOSCOPY;  Service: Gastroenterology;  Laterality: N/A;   HEMOSTASIS CLIP PLACEMENT  08/14/2020   Procedure: HEMOSTASIS CLIP PLACEMENT;  Surgeon: Meridee Score Netty Starring., MD;  Location: [facility] ENDOSCOPY;  Service: Gastroenterology;;   SUBMUCOSAL LIFTING INJECTION  08/14/2020   Procedure:  SUBMUCOSAL LIFTING INJECTION;  Surgeon: Lemar Lofty., MD;  Location: [facility] ENDOSCOPY;  Service: Gastroenterology;;   Livia Snellen Vein Surgery  2008 & 2010   No current facility-administered medications for this encounter.   No current facility-administered medications for this encounter. Allergies  Allergen Reactions   Morphine Sulfate Shortness Of Breath and Nausea And Vomiting   Lisinopril Cough   Progesterone Nausea Only   Family History  Problem Relation Age of Onset   Hypertension Mother    Osteoarthritis Father    Hypertension Brother    Cancer Maternal Grandmother        unknown   Cancer Maternal Grandfather        unknown   Cancer Paternal Grandmother        unknown   Cancer Paternal Grandfather        unknowon   Breast cancer Other    Colon cancer Neg Hx    Colon polyps Neg Hx    Esophageal cancer Neg Hx    Rectal cancer Neg Hx    Stomach cancer Neg Hx    Inflammatory bowel disease Neg Hx    Liver disease Neg Hx    Pancreatic cancer Neg Hx    Social History   Socioeconomic History   Marital status: Significant Other    Spouse name: Not on file   Number of children: Not on file   Years of education: Not on file   Highest education level: Bachelor's degree (e.g., BA, AB, BS)  Occupational History   Not on file  Tobacco Use   Smoking status: Some Days    Current packs/day: 0.50    Types: Cigarettes   Smokeless tobacco: Never  Tobacco comments:    Socially 3-4 times a week   Vaping Use   Vaping status: Never Used  Substance and Sexual Activity   Alcohol use: Yes    Alcohol/week: 12.0 standard drinks of alcohol    Types: 12 Standard drinks or equivalent per week   Drug use: No   Sexual activity: Not on file  Other Topics Concern   Not on file  Social History Narrative   Not on file   Social Drivers of Health   Financial Resource Strain: Medium Risk (06/21/2023)   Overall Financial Resource Strain (CARDIA)    Difficulty of Paying Living  Expenses: Somewhat hard  Food Insecurity: No Food Insecurity (06/21/2023)   Hunger Vital Sign    Worried About Running Out of Food in the Last Year: Never true    Ran Out of Food in the Last Year: Never true  Transportation Needs: No Transportation Needs (06/21/2023)   PRAPARE - Administrator, Civil Service (Medical): No    Lack of Transportation (Non-Medical): No  Physical Activity: Unknown (06/21/2023)   Exercise Vital Sign    Days of Exercise per Week: 0 days    Minutes of Exercise per Session: Not on file  Stress: Stress Concern Present (06/21/2023)   [employer] of Occupational Health - Occupational Stress Questionnaire    Feeling of Stress : To some extent  Social Connections: Moderately Isolated (06/21/2023)   Social Connection and Isolation Panel [NHANES]    Frequency of Communication with Friends and Family: Three times a week    Frequency of Social Gatherings with Friends and Family: Once a week    Attends Religious Services: Never    Database administrator or Organizations: No    Attends Engineer, structural: Not on file    Marital Status: Living with partner  Intimate Partner Violence: Not on file    Physical Exam: Today's Vitals   07/19/23 1146  Weight: (!) 137 kg   Body mass index is 46.61 kg/m. GEN: NAD EYE: Sclerae anicteric ENT: MMM CV: Non-tachycardic GI: Soft, NT/ND NEURO:  Alert & Oriented x 3  Lab Results: No results for input(s): "WBC", "HGB", "HCT", "PLT" in the last 72 hours. BMET No results for input(s): "NA", "K", "CL", "CO2", "GLUCOSE", "BUN", "CREATININE", "CALCIUM" in the last 72 hours. LFT No results for input(s): "PROT", "ALBUMIN", "AST", "ALT", "ALKPHOS", "BILITOT", "BILIDIR", "IBILI" in the last 72 hours. PT/INR No results for input(s): "LABPROT", "INR" in the last 72 hours.   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   The risks of an EUS  including intestinal perforation, bleeding, infection, aspiration, and medication effects were discussed as was the possibility it may not give a definitive diagnosis if a biopsy is performed.     The risks and benefits of endoscopic evaluation/treatment were discussed with the patient and/or family; these include but are not limited to the risk of perforation, infection, bleeding, missed lesions, lack of diagnosis, severe illness requiring hospitalization, as well as anesthesia and sedation related illnesses.  The patient's history has been reviewed, patient examined, no change in status, and deemed stable for procedure.  The patient and/or family is agreeable to proceed.    Corliss Parish, MD [facility] Office # [phone]

## 2023-07-25 NOTE — Transfer of Care (Signed)
Immediate Anesthesia Transfer of Care Note  Patient: Holly Horn  Procedure(s) Performed: LOWER ENDOSCOPIC ULTRASOUND (EUS) FLEXIBLE SIGMOIDOSCOPY  Patient Location: PACU and Endoscopy Unit  Anesthesia Type:MAC  Level of Consciousness: drowsy and patient cooperative  Airway & Oxygen Therapy: Patient Spontanous Breathing and Patient connected to nasal cannula oxygen  Post-op Assessment: Report given to RN and Post -op Vital signs reviewed and stable  Post vital signs: Reviewed and stable  Last Vitals:  Vitals Value Taken Time  BP    Temp    Pulse 83 07/25/23 1333  Resp 16 07/25/23 1333  SpO2 100 % 07/25/23 1333  Vitals shown include unfiled device data.  Last Pain:  Vitals:   07/25/23 1116  TempSrc: Temporal  PainSc: 0-No pain         Complications: No notable events documented.

## 2023-07-25 NOTE — Telephone Encounter (Signed)
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  Thanks. GM

## 2023-07-25 NOTE — Anesthesia Procedure Notes (Signed)
Date/Time: 07/25/2023 1:18 PM  Performed by: Maurene Capes, CRNAOxygen Delivery Method: Simple face mask Placement Confirmation: positive ETCO2 Dental Injury: Teeth and Oropharynx as per pre-operative assessment

## 2023-07-25 NOTE — Anesthesia Preprocedure Evaluation (Signed)
Anesthesia Evaluation  Patient identified by MRN, date of birth, ID band Patient awake    Reviewed: Allergy & Precautions, NPO status , Patient's Chart, lab work & pertinent test results  Airway Mallampati: II  TM Distance: >3 FB Neck ROM: Full    Dental no notable dental hx.    Pulmonary asthma , Current Smoker and Patient abstained from smoking.   Pulmonary exam normal        Cardiovascular hypertension, Pt. on medications  Rhythm:Regular Rate:Normal     Neuro/Psych  Headaches  Anxiety Depression       GI/Hepatic Neg liver ROS,GERD  Medicated,,  Endo/Other  negative endocrine ROS    Renal/GU negative Renal ROS  negative genitourinary   Musculoskeletal negative musculoskeletal ROS (+)    Abdominal Normal abdominal exam  (+)   Peds  Hematology Lab Results      Component                Value               Date                      WBC                      9.0                 06/21/2023                HGB                      12.3                06/21/2023                HCT                      37.7                06/21/2023                MCV                      88.4                06/21/2023                PLT                      347.0               06/21/2023              Anesthesia Other Findings   Reproductive/Obstetrics                             Anesthesia Physical Anesthesia Plan  ASA: 3  Anesthesia Plan: MAC   Post-op Pain Management:    Induction: Intravenous  PONV Risk Score and Plan: 1 and Propofol infusion and Treatment may vary due to age or medical condition  Airway Management Planned: Simple Face Mask and Nasal Cannula  Additional Equipment: None  Intra-op Plan:   Post-operative Plan:   Informed Consent: I have reviewed the patients History and Physical, chart, labs and discussed the procedure including the risks, benefits and alternatives for the proposed  anesthesia with the patient  or authorized representative who has indicated his/her understanding and acceptance.     Dental advisory given  Plan Discussed with: CRNA  Anesthesia Plan Comments:        Anesthesia Quick Evaluation

## 2023-07-25 NOTE — Op Note (Signed)
[facility] Patient Name: Holly Horn Procedure Date: 07/25/2023 MRN: [id number] Attending MD: Corliss Parish , MD, [id number] Date of Birth: 1971-07-28 CSN: [id number] Age: 52 Admit Type: Outpatient Procedure:                Lower EUS Indications:              Neuroendocrine Tumor Providers:                Corliss Parish, MD, Fransisca Connors, Harrington Challenger, Technician, Caprice Red, CRNA Referring MD:              Medicines:                Monitored Anesthesia Care Complications:            No immediate complications. Estimated Blood Loss:     Estimated blood loss was minimal. Procedure:                Pre-Anesthesia Assessment:                           - Prior to the procedure, a History and Physical                            was performed, and patient medications and                            allergies were reviewed. The patient's tolerance of                            previous anesthesia was also reviewed. The risks                            and benefits of the procedure and the sedation                            options and risks were discussed with the patient.                            All questions were answered, and informed consent                            was obtained. Prior Anticoagulants: The patient has                            taken no anticoagulant or antiplatelet agents                            except for aspirin. ASA Grade Assessment: III - A                            patient with severe systemic disease. After  reviewing the risks and benefits, the patient was                            deemed in satisfactory condition to undergo the                            procedure.                           After obtaining informed consent, the endoscope was                            passed under direct vision. Throughout the                            procedure, the patient's  blood pressure, pulse, and                            oxygen saturations were monitored continuously. The                            [id number]-[id number] ([id number]) Olympus endoscope was introduced                            through the anus and advanced to the the descending                            colon. The GF-[id number]-[id number] ([id number]) Olympus radial                            ultrasound scope was introduced through the anus                            and advanced to the the rectosigmoid junction for                            ultrasound. The lower EUS was accomplished without                            difficulty. The patient tolerated the procedure.                            The quality of the bowel preparation was adequate. Scope In: 1:10:52 PM Scope Out: 1:23:05 PM Total Procedure Duration: 0 hours 12 minutes 13 seconds  Findings:      The digital rectal exam findings include hemorrhoids. Pertinent       negatives include no palpable rectal lesions.      Skin tags were found on perianal exam.      ENDOSCOPIC FINDING: :      A medium post mucosectomy scar was found in the mid rectum. The scar       tissue was healthy in appearance.      Normal mucosa was found in the rectum, in the recto-sigmoid colon and in       the sigmoid colon.  ENDOSONOGRAPHIC FINDING: :      No malignant-appearing lymph nodes were visualized in the perirectal       region and in the left iliac region. The nodes were.      The perirectal space was normal.      The rectum was normal.      The internal anal sphincter was visualized endosonographically and       appeared normal. Impression:               FLEX Impression:                           - Hemorrhoids found on digital rectal exam.                            Perianal skin tags found on perianal exam.                           - Post mucosectomy scar in the mid rectum.                           - Normal mucosa in the rectum, in the recto-sigmoid                             colon and in the sigmoid colon.                           EUS Impression:                           - No malignant-appearing lymph nodes were                            visualized endosonographically in the perirectal                            region and in the left iliac region.                           - Endosonographic images of the perirectal space                            were unremarkable.                           - Endosonographic images of the rectum were                            unremarkable.                           - The internal anal sphincter was visualized                            endosonographically and appeared normal. Moderate Sedation:      Not Applicable - Patient had care per Anesthesia. Recommendation:           -  The patient will be observed post-procedure,                            until all discharge criteria are met.                           - Discharge patient to home.                           - Patient has a contact number available for                            emergencies. The signs and symptoms of potential                            delayed complications were discussed with the                            patient. Return to normal activities tomorrow.                            Written discharge instructions were provided to the                            patient.                           - Resume previous diet.                           - Repeat lower endoscopic ultrasound in 2 years for                            surveillance with history of previous Rectal NET.                           - The findings and recommendations were discussed                            with the patient. Procedure Code(s):        --- Professional ---                           [id number], Sigmoidoscopy, flexible; with endoscopic                            ultrasound examination Diagnosis Code(s):        --- Professional ---                           U04.540,  Other specified postprocedural states                           K64.9, Unspecified hemorrhoids                           I89.9,  Noninfective disorder of lymphatic vessels                            and lymph nodes, unspecified                           K64.4, Residual hemorrhoidal skin tags CPT copyright 2022 American Medical Association. All rights reserved. The codes documented in this report are preliminary and upon coder review may  be revised to meet current compliance requirements. Corliss Parish, MD 07/25/2023 1:34:15 PM Number of Addenda: 0

## 2023-07-25 NOTE — Discharge Instructions (Signed)

## 2023-07-26 NOTE — Anesthesia Postprocedure Evaluation (Signed)
Anesthesia Post Note  Patient: Holly Horn  Procedure(s) Performed: LOWER ENDOSCOPIC ULTRASOUND (EUS) FLEXIBLE SIGMOIDOSCOPY     Patient location during evaluation: PACU Anesthesia Type: MAC Level of consciousness: awake and alert Pain management: pain level controlled Vital Signs Assessment: post-procedure vital signs reviewed and stable Respiratory status: spontaneous breathing, nonlabored ventilation, respiratory function stable and patient connected to nasal cannula oxygen Cardiovascular status: stable and blood pressure returned to baseline Postop Assessment: no apparent nausea or vomiting Anesthetic complications: no   No notable events documented.  Last Vitals:  Vitals:   07/25/23 1345 07/25/23 1355  BP: (!) 145/81 134/85  Pulse: 93 79  Resp: 17 15  Temp:    SpO2: 98% 98%    Last Pain:  Vitals:   07/26/23 1639  TempSrc:   PainSc: 0-No pain                 Earl Lites P Neima Lacross

## 2023-07-28 ENCOUNTER — Encounter (HOSPITAL_COMMUNITY): Payer: Self-pay | Admitting: Gastroenterology

## 2023-07-28 DIAGNOSIS — Z01419 Encounter for gynecological examination (general) (routine) without abnormal findings: Secondary | ICD-10-CM | POA: Diagnosis not present

## 2023-07-28 DIAGNOSIS — Z1151 Encounter for screening for human papillomavirus (HPV): Secondary | ICD-10-CM | POA: Diagnosis not present

## 2023-07-28 DIAGNOSIS — Z124 Encounter for screening for malignant neoplasm of cervix: Secondary | ICD-10-CM | POA: Diagnosis not present

## 2023-07-28 DIAGNOSIS — Z1231 Encounter for screening mammogram for malignant neoplasm of breast: Secondary | ICD-10-CM | POA: Diagnosis not present

## 2023-08-15 ENCOUNTER — Other Ambulatory Visit: Payer: Self-pay | Admitting: Adult Health

## 2023-08-15 DIAGNOSIS — F5101 Primary insomnia: Secondary | ICD-10-CM

## 2023-08-15 DIAGNOSIS — F32A Depression, unspecified: Secondary | ICD-10-CM

## 2023-08-16 NOTE — Telephone Encounter (Signed)
 Okay for refill?

## 2023-08-23 ENCOUNTER — Ambulatory Visit: Payer: BC Managed Care – PPO | Admitting: Adult Health

## 2023-08-23 NOTE — Progress Notes (Deleted)
 Subjective:    Patient ID: Holly Horn, female    DOB: [birth year], 53 y.o.   MRN: [id number]  HPI 53 year old female who presents to the office today for follow-up regarding anxiety and depression.  During her prior visit she had been placed on BuSpar  5 mg twice daily a month prior and was kept on Celexa  40 mg.  On follow-up a month ago she reported that she was tolerating BuSpar  well but had not noticed much improvement in her anxiety and depression.  We decided to increase her BuSpar  to 15 mg twice daily and keep her on Celexa  40 mg   Review of Systems See HPI   Past Medical History:  Diagnosis Date   Ectopic pregnancy    x 3   Essential hypertension    GERD (gastroesophageal reflux disease)    Hyperlipidemia     Social History   Socioeconomic History   Marital status: Significant Other    Spouse name: Not on file   Number of children: Not on file   Years of education: Not on file   Highest education level: Bachelor's degree (e.g., BA, AB, BS)  Occupational History   Not on file  Tobacco Use   Smoking status: Some Days    Current packs/day: 0.50    Types: Cigarettes   Smokeless tobacco: Never   Tobacco comments:    Socially 3-4 times a week   Vaping Use   Vaping status: Never Used  Substance and Sexual Activity   Alcohol use: Yes    Alcohol/week: 12.0 standard drinks of alcohol    Types: 12 Standard drinks or equivalent per week   Drug use: No   Sexual activity: Not on file  Other Topics Concern   Not on file  Social History Narrative   Not on file   Social Drivers of Health   Financial Resource Strain: Low Risk  (08/22/2023)   Overall Financial Resource Strain (CARDIA)    Difficulty of Paying Living Expenses: Not hard at all  Recent Concern: Financial Resource Strain - Medium Risk (06/21/2023)   Overall Financial Resource Strain (CARDIA)    Difficulty of Paying Living Expenses: Somewhat hard  Food Insecurity: No Food Insecurity (08/22/2023)   Hunger  Vital Sign    Worried About Running Out of Food in the Last Year: Never true    Ran Out of Food in the Last Year: Never true  Transportation Needs: No Transportation Needs (08/22/2023)   PRAPARE - Administrator, Civil Service (Medical): No    Lack of Transportation (Non-Medical): No  Physical Activity: Inactive (08/22/2023)   Exercise Vital Sign    Days of Exercise per Week: 1 day    Minutes of Exercise per Session: 0 min  Stress: Stress Concern Present (08/22/2023)   [employer] of Occupational Health - Occupational Stress Questionnaire    Feeling of Stress : Very much  Social Connections: Moderately Isolated (08/22/2023)   Social Connection and Isolation Panel [NHANES]    Frequency of Communication with Friends and Family: More than three times a week    Frequency of Social Gatherings with Friends and Family: Once a week    Attends Religious Services: Never    Database Administrator or Organizations: No    Attends Engineer, Structural: Not on file    Marital Status: Living with partner  Intimate Partner Violence: Not on file    Past Surgical History:  Procedure Laterality Date   BIOPSY  01/06/2021  Procedure: BIOPSY;  Surgeon: Wilhelmenia Aloha Raddle., MD;  Location: [facility];  Service: Gastroenterology;;   COLONOSCOPY  06/2020   ECTOPIC PREGNANCY SURGERY     ENDOSCOPIC MUCOSAL RESECTION  08/14/2020   Procedure: ENDOSCOPIC MUCOSAL RESECTION;  Surgeon: Wilhelmenia Aloha Raddle., MD;  Location: [facility] END[facility]COPY;  Service: Gastroenterology;;   ESOPHAGOGASTRODUODENOSCOPY N/A 01/06/2021   Procedure: ESOPHAGOGASTRODUODENOSCOPY (EGD);  Surgeon: Wilhelmenia Aloha Raddle., MD;  Location: [facility];  Service: Gastroenterology;  Laterality: N/A;   EUS N/A 08/14/2020   Procedure: LOWER ENDOSCOPIC ULTRASOUND (EUS);  Surgeon: Wilhelmenia Aloha Raddle., MD;  Location: [facility] ENDOSCOPY;  Service: Gastroenterology;  Laterality: N/A;   EUS N/A 01/06/2021   Procedure: UPPER ENDOSCOPIC  ULTRASOUND (EUS) RADIAL;  Surgeon: Wilhelmenia Aloha Raddle., MD;  Location: [facility];  Service: Gastroenterology;  Laterality: N/A;   EUS N/A 07/25/2023   Procedure: LOWER ENDOSCOPIC ULTRASOUND (EUS);  Surgeon: Wilhelmenia Aloha Raddle., MD;  Location: [facility];  Service: Gastroenterology;  Laterality: N/A;   FINE NEEDLE ASPIRATION  01/06/2021   Procedure: FINE NEEDLE ASPIRATION (FNA) LINEAR;  Surgeon: Wilhelmenia Aloha Raddle., MD;  Location: [facility];  Service: Gastroenterology;;   ENID SIGMOIDOSCOPY N/A 08/14/2020   Procedure: ENID MORIN;  Surgeon: Wilhelmenia Aloha Raddle., MD;  Location: [facility]COPY;  Service: Gastroenterology;  Laterality: N/A;   FLEXIBLE SIGMOIDOSCOPY N/A 07/25/2023   Procedure: FLEXIBLE SIGMOIDOSCOPY;  Surgeon: Wilhelmenia Aloha Raddle., MD;  Location: [facility];  Service: Gastroenterology;  Laterality: N/A;   HEMOSTASIS CLIP PLACEMENT  08/14/2020   Procedure: HEMOSTASIS CLIP PLACEMENT;  Surgeon: Wilhelmenia Aloha Raddle., MD;  Location: [facility] END[facility]COPY;  Service: Gastroenterology;;   SUBMUCOSAL LIFTING INJECTION  08/14/2020   Procedure: SUBMUCOSAL LIFTING INJECTION;  Surgeon: Wilhelmenia Aloha Raddle., MD;  Location: [facility] ENDOSCOPY;  Service: Gastroenterology;;   [facility]  2008 & 2010    Family History  Problem Relation Age of Onset   Hypertension Mother    Osteoarthritis Father    Hypertension Brother    Cancer Maternal Grandmother        unknown   Cancer Maternal Grandfather        unknown   Cancer Paternal Grandmother        unknown   Cancer Paternal Grandfather        unknowon   Breast cancer Other    Colon cancer Neg Hx    Colon polyps Neg Hx    Esophageal cancer Neg Hx    Rectal cancer Neg Hx    Stomach cancer Neg Hx    Inflammatory bowel disease Neg Hx    Liver disease Neg Hx    Pancreatic cancer Neg Hx     Allergies  Allergen Reactions   Morphine Sulfate Shortness Of Breath and Nausea And Vomiting   Lisinopril  Cough    Progesterone Nausea Only    Current Outpatient Medications on File Prior to Visit  Medication Sig Dispense Refill   aspirin  EC 81 MG tablet Take 1 tablet (81 mg total) by mouth daily. Swallow whole. 90 tablet 3   busPIRone  (BUSPAR ) 15 MG tablet TAKE 1 TABLET BY MOUTH 2 TIMES DAILY. 180 tablet 1   citalopram  (CELEXA ) 40 MG tablet Take 1 tablet (40 mg total) by mouth daily. 90 tablet 1   ibuprofen (ADVIL) 200 MG tablet Take 400 mg by mouth every 8 (eight) hours as needed (pain).     losartan  (COZAAR ) 100 MG tablet TAKE 1 TABLET BY MOUTH EVERY DAY 90 tablet 1   omeprazole  (PRILOSEC) 20 MG capsule TAKE 1 CAPSULE BY  MOUTH EVERY DAY 90 capsule 1   ondansetron  (ZOFRAN -ODT) 8 MG disintegrating tablet Take 1 tablet (8 mg total) by mouth every 8 (eight) hours as needed for nausea or vomiting. 20 tablet 0   rosuvastatin  (CRESTOR ) 20 MG tablet Take 1 tablet (20 mg total) by mouth daily. 90 tablet 3   temazepam  (RESTORIL ) 30 MG capsule TAKE 1 CAPSULE BY MOUTH EVERY DAY AT BEDTIME AS NEEDED FOR SLEEP 30 capsule 2   No current facility-administered medications on file prior to visit.    LMP 08/23/2013       Objective:   Physical Exam Vitals and nursing note reviewed.  Constitutional:      Appearance: Normal appearance. She is obese.  Cardiovascular:     Rate and Rhythm: Normal rate and regular rhythm.     Pulses: Normal pulses.     Heart sounds: Normal heart sounds.  Pulmonary:     Effort: Pulmonary effort is normal.     Breath sounds: Normal breath sounds.  Skin:    General: Skin is warm and dry.  Neurological:     General: No focal deficit present.     Mental Status: She is alert and oriented to person, place, and time.  Psychiatric:        Mood and Affect: Mood normal.        Behavior: Behavior normal.        Thought Content: Thought content normal.        Judgment: Judgment normal.           Assessment & Plan:

## 2023-09-02 ENCOUNTER — Other Ambulatory Visit: Payer: Self-pay | Admitting: Adult Health

## 2023-09-02 ENCOUNTER — Encounter: Payer: Self-pay | Admitting: Adult Health

## 2023-09-02 DIAGNOSIS — F32A Depression, unspecified: Secondary | ICD-10-CM

## 2023-09-02 NOTE — Telephone Encounter (Signed)
Please advise if refill is okay?

## 2023-09-02 NOTE — Telephone Encounter (Signed)
Rx refilled.

## 2023-10-19 ENCOUNTER — Other Ambulatory Visit: Payer: Self-pay | Admitting: Adult Health

## 2023-10-19 DIAGNOSIS — K219 Gastro-esophageal reflux disease without esophagitis: Secondary | ICD-10-CM

## 2023-11-30 ENCOUNTER — Other Ambulatory Visit (HOSPITAL_BASED_OUTPATIENT_CLINIC_OR_DEPARTMENT_OTHER): Payer: Self-pay | Admitting: Cardiology

## 2023-11-30 DIAGNOSIS — I10 Essential (primary) hypertension: Secondary | ICD-10-CM

## 2023-12-05 DIAGNOSIS — H2513 Age-related nuclear cataract, bilateral: Secondary | ICD-10-CM | POA: Diagnosis not present

## 2023-12-05 DIAGNOSIS — H11153 Pinguecula, bilateral: Secondary | ICD-10-CM | POA: Diagnosis not present

## 2023-12-05 DIAGNOSIS — H524 Presbyopia: Secondary | ICD-10-CM | POA: Diagnosis not present

## 2023-12-05 DIAGNOSIS — H52203 Unspecified astigmatism, bilateral: Secondary | ICD-10-CM | POA: Diagnosis not present

## 2024-01-26 DIAGNOSIS — L821 Other seborrheic keratosis: Secondary | ICD-10-CM | POA: Diagnosis not present

## 2024-01-26 DIAGNOSIS — D235 Other benign neoplasm of skin of trunk: Secondary | ICD-10-CM | POA: Diagnosis not present

## 2024-01-26 DIAGNOSIS — L918 Other hypertrophic disorders of the skin: Secondary | ICD-10-CM | POA: Diagnosis not present

## 2024-01-26 DIAGNOSIS — Z85828 Personal history of other malignant neoplasm of skin: Secondary | ICD-10-CM | POA: Diagnosis not present

## 2024-01-26 DIAGNOSIS — D225 Melanocytic nevi of trunk: Secondary | ICD-10-CM | POA: Diagnosis not present

## 2024-02-08 NOTE — Progress Notes (Signed)
 [facility] Quality Team Note  Name: Holly Horn Date of Birth: [birth year] MRN: [id number] Date: 02/08/2024  [facility] Quality Team has reviewed this patient's chart, please see recommendations below:  [facility] Quality Other; (CHART REVIEWED. PATIENT NEEDS CONTROLLING BLOOD PRESSURE LESS THAN 140/90 FOR GAP CLOSURE.)

## 2024-02-20 ENCOUNTER — Other Ambulatory Visit: Payer: Self-pay | Admitting: Adult Health

## 2024-02-20 DIAGNOSIS — F32A Depression, unspecified: Secondary | ICD-10-CM

## 2024-02-20 DIAGNOSIS — K219 Gastro-esophageal reflux disease without esophagitis: Secondary | ICD-10-CM

## 2024-03-09 ENCOUNTER — Encounter: Payer: Self-pay | Admitting: Adult Health

## 2024-03-09 DIAGNOSIS — F32A Depression, unspecified: Secondary | ICD-10-CM

## 2024-03-09 MED ORDER — BUSPIRONE HCL 15 MG PO TABS: 15.0000 mg | ORAL_TABLET | Freq: Two times a day (BID) | ORAL | 1 refills | Status: AC

## 2024-03-20 ENCOUNTER — Other Ambulatory Visit: Payer: Self-pay | Admitting: Adult Health

## 2024-03-20 DIAGNOSIS — F5101 Primary insomnia: Secondary | ICD-10-CM

## 2024-03-22 ENCOUNTER — Other Ambulatory Visit: Payer: Self-pay | Admitting: Adult Health

## 2024-03-22 ENCOUNTER — Encounter: Payer: Self-pay | Admitting: Adult Health

## 2024-03-22 DIAGNOSIS — F5101 Primary insomnia: Secondary | ICD-10-CM

## 2024-03-22 MED ORDER — TEMAZEPAM 30 MG PO CAPS: ORAL_CAPSULE | ORAL | 2 refills | Status: DC

## 2024-03-22 MED ORDER — TEMAZEPAM 30 MG PO CAPS
ORAL_CAPSULE | ORAL | 2 refills | Status: DC
Start: 2024-03-22 — End: 2024-03-22

## 2024-03-22 NOTE — Addendum Note (Signed)
 Addended by: VICCI LEADER R on: 03/22/2024 09:25 AM   Modules accepted: Orders

## 2024-06-19 ENCOUNTER — Other Ambulatory Visit (HOSPITAL_BASED_OUTPATIENT_CLINIC_OR_DEPARTMENT_OTHER): Payer: Self-pay | Admitting: Cardiology

## 2024-06-20 ENCOUNTER — Encounter (HOSPITAL_BASED_OUTPATIENT_CLINIC_OR_DEPARTMENT_OTHER): Payer: Self-pay

## 2024-06-21 ENCOUNTER — Ambulatory Visit: Payer: Self-pay | Admitting: Adult Health

## 2024-06-21 ENCOUNTER — Other Ambulatory Visit: Payer: Self-pay | Admitting: Adult Health

## 2024-06-21 ENCOUNTER — Encounter: Payer: Self-pay | Admitting: Adult Health

## 2024-06-21 ENCOUNTER — Ambulatory Visit: Admitting: Adult Health

## 2024-06-21 VITALS — BP 130/86 | Temp 98.2°F | Ht 66.0 in | Wt 304.0 lb

## 2024-06-21 DIAGNOSIS — G4733 Obstructive sleep apnea (adult) (pediatric): Secondary | ICD-10-CM

## 2024-06-21 DIAGNOSIS — E782 Mixed hyperlipidemia: Secondary | ICD-10-CM

## 2024-06-21 DIAGNOSIS — I10 Essential (primary) hypertension: Secondary | ICD-10-CM

## 2024-06-21 DIAGNOSIS — Z720 Tobacco use: Secondary | ICD-10-CM

## 2024-06-21 DIAGNOSIS — R7303 Prediabetes: Secondary | ICD-10-CM | POA: Diagnosis not present

## 2024-06-21 DIAGNOSIS — Z0000 Encounter for general adult medical examination without abnormal findings: Secondary | ICD-10-CM

## 2024-06-21 DIAGNOSIS — F419 Anxiety disorder, unspecified: Secondary | ICD-10-CM

## 2024-06-21 DIAGNOSIS — Z0184 Encounter for antibody response examination: Secondary | ICD-10-CM

## 2024-06-21 DIAGNOSIS — Z23 Encounter for immunization: Secondary | ICD-10-CM | POA: Diagnosis not present

## 2024-06-21 DIAGNOSIS — F5101 Primary insomnia: Secondary | ICD-10-CM

## 2024-06-21 DIAGNOSIS — F32A Depression, unspecified: Secondary | ICD-10-CM

## 2024-06-21 LAB — CBC WITH DIFFERENTIAL/PLATELET
Basophils Absolute: 0 K/uL (ref 0.0–0.1)
Basophils Relative: 0.5 % (ref 0.0–3.0)
Eosinophils Absolute: 0.1 K/uL (ref 0.0–0.7)
Eosinophils Relative: 1.2 % (ref 0.0–5.0)
HCT: 38.2 % (ref 36.0–46.0)
Hemoglobin: 12.5 g/dL (ref 12.0–15.0)
Lymphocytes Relative: 25.8 % (ref 12.0–46.0)
Lymphs Abs: 2.1 K/uL (ref 0.7–4.0)
MCHC: 32.7 g/dL (ref 30.0–36.0)
MCV: 85.9 fl (ref 78.0–100.0)
Monocytes Absolute: 0.4 K/uL (ref 0.1–1.0)
Monocytes Relative: 4.8 % (ref 3.0–12.0)
Neutro Abs: 5.5 K/uL (ref 1.4–7.7)
Neutrophils Relative %: 67.7 % (ref 43.0–77.0)
Platelets: 291 K/uL (ref 150.0–400.0)
RBC: 4.45 Mil/uL (ref 3.87–5.11)
RDW: 15.8 % — ABNORMAL HIGH (ref 11.5–15.5)
WBC: 8.1 K/uL (ref 4.0–10.5)

## 2024-06-21 LAB — COMPREHENSIVE METABOLIC PANEL WITH GFR
ALT: 33 U/L (ref 0–35)
AST: 46 U/L — ABNORMAL HIGH (ref 0–37)
Albumin: 4.3 g/dL (ref 3.5–5.2)
Alkaline Phosphatase: 86 U/L (ref 39–117)
BUN: 11 mg/dL (ref 6–23)
CO2: 27 meq/L (ref 19–32)
Calcium: 9.3 mg/dL (ref 8.4–10.5)
Chloride: 101 meq/L (ref 96–112)
Creatinine, Ser: 0.6 mg/dL (ref 0.40–1.20)
GFR: 101.83 mL/min (ref >60.00)
Glucose, Bld: 98 mg/dL (ref 70–99)
Potassium: 4 meq/L (ref 3.5–5.1)
Sodium: 138 meq/L (ref 135–145)
Total Bilirubin: 0.8 mg/dL (ref 0.2–1.2)
Total Protein: 7.3 g/dL (ref 6.0–8.3)

## 2024-06-21 LAB — LIPID PANEL
Cholesterol: 152 mg/dL (ref 0–200)
HDL: 64.2 mg/dL (ref >39.00)
LDL Cholesterol: 70 mg/dL (ref 0–99)
NonHDL: 87.6
Total CHOL/HDL Ratio: 2
Triglycerides: 90 mg/dL (ref 0.0–149.0)
VLDL: 18 mg/dL (ref 0.0–40.0)

## 2024-06-21 LAB — TSH: TSH: 1.7 u[IU]/mL (ref 0.35–5.50)

## 2024-06-21 LAB — HEMOGLOBIN A1C: Hgb A1c MFr Bld: 5.9 % (ref 4.6–6.5)

## 2024-06-21 MED ORDER — LOSARTAN POTASSIUM 100 MG PO TABS: 100.0000 mg | ORAL_TABLET | Freq: Every day | ORAL | 3 refills | Status: AC

## 2024-06-21 MED ORDER — ZEPBOUND 2.5 MG/0.5ML ~~LOC~~ SOAJ: 2.5000 mg | SUBCUTANEOUS | 0 refills | Status: AC

## 2024-06-21 NOTE — Progress Notes (Deleted)
 Subjective:    Patient ID: Holly Horn, female    DOB: May 24, 1971, 53 y.o.   MRN: [id number]  HPI Patient presents for yearly preventative medicine examination. She is a 53 year old female who  has a past medical history of Ectopic pregnancy, Essential hypertension, GERD (gastroesophageal reflux disease), and Hyperlipidemia.  Hyperlipidemia -managed with Crestor  20 mg daily.  She had a CT Calcium  score was 7 in 06/2023  Lab Results  Component Value Date   CHOL 195 04/13/2023   HDL 73 04/13/2023   LDLCALC 100 (H) 04/13/2023   TRIG 125 04/13/2023   CHOLHDL 2.7 04/13/2023   Hypertension-managed with Cozaar  100 mg daily.  She denies dizziness, lightheadedness, chest pain, or shortness of breath BP Readings from Last 3 Encounters:  06/21/24 130/86  07/25/23 134/85  07/21/23 136/88   Anxiety/depression-managed with Celexa  40 mg daily and busapr 15 mg daily. She reports that when she took the medication twice daily it caused her to have a  tense jaw and then she went back to taking it daily. She feels as though her anxiety is good but her depression is still an issue. She does not leave the house much and leads a sedentary lifestyle.   Pre diabetes/Obesity - Lab Results  Component Value Date   HGBA1C 5.9 (H) 04/13/2023   HGBA1C 5.9 06/18/2022   HGBA1C 5.8 07/29/2021   Wt Readings from Last 3 Encounters:  06/21/24 (!) 304 lb (137.9 kg)  07/25/23 (!) 301 lb (136.5 kg)  07/21/23 (!) 301 lb (136.5 kg)   Insomnia - takes Restoril  30 mg at bedtime PRN-  Tobacco Use  she restarted smoking.   OSA - Sleep study in 2023 showed sleep  apnea with AHI 9.4/hr. She has tried multiple masks but remains intolerant to CPAP.   All immunizations and health maintenance protocols were reviewed with the patient and needed orders were placed.   Appropriate screening laboratory values were ordered for the patient including screening of hyperlipidemia, renal function and hepatic  function.   Medication reconciliation,  past medical history, social history, problem list and allergies were reviewed in detail with the patient  Goals were established with regard to weight loss, exercise, and  diet in compliance with medications. She is not exercising or following a specific diet  Wt Readings from Last 3 Encounters:  06/21/24 (!) 304 lb (137.9 kg)  07/25/23 (!) 301 lb (136.5 kg)  07/21/23 (!) 301 lb (136.5 kg)   She is scheduled for her mammogram and pap in December.   Review of Systems  Constitutional: Negative.   HENT: Negative.    Eyes: Negative.   Respiratory: Negative.    Cardiovascular: Negative.   Gastrointestinal: Negative.   Endocrine: Negative.   Genitourinary: Negative.   Musculoskeletal: Negative.   Skin: Negative.   Allergic/Immunologic: Negative.   Neurological: Negative.   Hematological: Negative.   Psychiatric/Behavioral: Negative.     Past Medical History:  Diagnosis Date   Ectopic pregnancy    x 3   Essential hypertension    GERD (gastroesophageal reflux disease)    Hyperlipidemia     Social History   Socioeconomic History   Marital status: Significant Other    Spouse name: Not on file   Number of children: Not on file   Years of education: Not on file   Highest education level: Bachelor's degree (e.g., BA, AB, BS)  Occupational History   Not on file  Tobacco Use   Smoking status: Some Days  Current packs/day: 0.50    Types: Cigarettes   Smokeless tobacco: Never   Tobacco comments:    Socially 3-4 times a week   Vaping Use   Vaping status: Never Used  Substance and Sexual Activity   Alcohol use: Yes    Alcohol/week: 12.0 standard drinks of alcohol    Types: 12 Standard drinks or equivalent per week   Drug use: No   Sexual activity: Not on file  Other Topics Concern   Not on file  Social History Narrative   Not on file   Social Drivers of Health   Financial Resource Strain: Low Risk  (06/18/2024)   Overall  Financial Resource Strain (CARDIA)    Difficulty of Paying Living Expenses: Not very hard  Food Insecurity: No Food Insecurity (06/18/2024)   Hunger Vital Sign    Worried About Running Out of Food in the Last Year: Never true    Ran Out of Food in the Last Year: Never true  Transportation Needs: No Transportation Needs (06/18/2024)   PRAPARE - Administrator, Civil Service (Medical): No    Lack of Transportation (Non-Medical): No  Physical Activity: Inactive (08/22/2023)   Exercise Vital Sign    Days of Exercise per Week: 1 day    Minutes of Exercise per Session: 0 min  Stress: Stress Concern Present (08/22/2023)   [employer] of Occupational [employer] - Occupational Stress Questionnaire    Feeling of Stress : Very much  Social Connections: Socially Isolated (06/18/2024)   Social Connection and Isolation Panel    Frequency of Communication with Friends and Family: Once a week    Frequency of Social Gatherings with Friends and Family: Once a week    Attends Religious Services: Never    Database Administrator or Organizations: No    Attends Engineer, Structural: Not on file    Marital Status: Living with partner  Intimate Partner Violence: Not on file    Past Surgical History:  Procedure Laterality Date   BIOPSY  01/06/2021   Procedure: BIOPSY;  Surgeon: Mansouraty, Aloha Raddle., MD;  Location: [facility];  Service: Gastroenterology;;   COLONOSCOPY  06/2020   ECTOPIC PREGNANCY SURGERY     ENDOSCOPIC MUCOSAL RESECTION  08/14/2020   Procedure: ENDOSCOPIC MUCOSAL RESECTION;  Surgeon: Wilhelmenia Aloha Raddle., MD;  Location: [facility] ENDOSCOPY;  Service: Gastroenterology;;   ESOPHAGOGASTRODUODENOSCOPY N/A 01/06/2021   Procedure: ESOPHAGOGASTRODUODENOSCOPY (EGD);  Surgeon: Wilhelmenia Aloha Raddle., MD;  Location: [facility];  Service: Gastroenterology;  Laterality: N/A;   EUS N/A 08/14/2020   Procedure: LOWER ENDOSCOPIC ULTRASOUND (EUS);  Surgeon: Wilhelmenia Aloha Raddle.,  MD;  Location: [facility] ENDOSCOPY;  Service: Gastroenterology;  Laterality: N/A;   EUS N/A 01/06/2021   Procedure: UPPER ENDOSCOPIC ULTRASOUND (EUS) RADIAL;  Surgeon: Wilhelmenia Aloha Raddle., MD;  Location: [facility];  Service: Gastroenterology;  Laterality: N/A;   EUS N/A 07/25/2023   Procedure: LOWER ENDOSCOPIC ULTRASOUND (EUS);  Surgeon: Wilhelmenia Aloha Raddle., MD;  Location: [facility];  Service: Gastroenterology;  Laterality: N/A;   FINE NEEDLE ASPIRATION  01/06/2021   Procedure: FINE NEEDLE ASPIRATION (FNA) LINEAR;  Surgeon: Wilhelmenia Aloha Raddle., MD;  Location: [facility];  Service: Gastroenterology;;   ENID SIGMOIDOSCOPY N/A 08/14/2020   Procedure: ENID MORIN;  Surgeon: Wilhelmenia Aloha Raddle., MD;  Location: [facility];  Service: Gastroenterology;  Laterality: N/A;   FLEXIBLE SIGMOIDOSCOPY N/A 07/25/2023   Procedure: FLEXIBLE SIGMOIDOSCOPY;  Surgeon: Wilhelmenia Aloha Raddle., MD;  Location: [facility];  Service: Gastroenterology;  Laterality: N/A;  HEMOSTASIS CLIP PLACEMENT  08/14/2020   Procedure: HEMOSTASIS CLIP PLACEMENT;  Surgeon: Wilhelmenia Aloha Raddle., MD;  Location: [facility] ENDOSCOPY;  Service: Gastroenterology;;   SUBMUCOSAL LIFTING INJECTION  08/14/2020   Procedure: SUBMUCOSAL LIFTING INJECTION;  Surgeon: Wilhelmenia Aloha Raddle., MD;  Location: [facility] ENDOSCOPY;  Service: Gastroenterology;;   [facility]  2008 & 2010    Family History  Problem Relation Age of Onset   Hypertension Mother    Osteoarthritis Father    Hypertension Brother    Cancer Maternal Grandmother        unknown   Cancer Maternal Grandfather        unknown   Cancer Paternal Grandmother        unknown   Cancer Paternal Grandfather        unknowon   Breast cancer Other    Colon cancer Neg Hx    Colon polyps Neg Hx    Esophageal cancer Neg Hx    Rectal cancer Neg Hx    Stomach cancer Neg Hx    Inflammatory bowel disease Neg Hx    Liver disease Neg Hx    Pancreatic cancer Neg Hx      Allergies  Allergen Reactions   Morphine Sulfate Shortness Of Breath and Nausea And Vomiting   Lisinopril  Cough   Progesterone Nausea Only    Current Outpatient Medications on File Prior to Visit  Medication Sig Dispense Refill   aspirin  EC 81 MG tablet Take 1 tablet (81 mg total) by mouth daily. Swallow whole. 90 tablet 3   busPIRone  (BUSPAR ) 15 MG tablet Take 1 tablet (15 mg total) by mouth 2 (two) times daily. 180 tablet 1   citalopram  (CELEXA ) 40 MG tablet TAKE 1 TABLET BY MOUTH EVERY DAY 90 tablet 1   ibuprofen (ADVIL) 200 MG tablet Take 400 mg by mouth every 8 (eight) hours as needed (pain).     omeprazole  (PRILOSEC) 20 MG capsule TAKE 1 CAPSULE BY MOUTH EVERY DAY 90 capsule 1   ondansetron  (ZOFRAN -ODT) 8 MG disintegrating tablet Take 1 tablet (8 mg total) by mouth every 8 (eight) hours as needed for nausea or vomiting. 20 tablet 0   rosuvastatin  (CRESTOR ) 20 MG tablet TAKE 1 TABLET BY MOUTH EVERY DAY 30 tablet 0   temazepam  (RESTORIL ) 30 MG capsule TAKE 1 CAPSULE BY MOUTH EVERY DAY AT BEDTIME AS NEEDED FOR SLEEP 30 capsule 2   No current facility-administered medications on file prior to visit.    BP 130/86   Temp 98.2 F (36.8 C) (Oral)   Ht 5' 6 (1.676 m)   Wt (!) 304 lb (137.9 kg)   LMP 08/23/2013   BMI 49.07 kg/m       Objective:   Physical Exam Vitals and nursing note reviewed.  Constitutional:      General: She is not in acute distress.    Appearance: Normal appearance. She is obese. She is not ill-appearing.  HENT:     Head: Normocephalic and atraumatic.     Right Ear: Tympanic membrane, ear canal and external ear normal. There is no impacted cerumen.     Left Ear: Tympanic membrane, ear canal and external ear normal. There is no impacted cerumen.     Nose: Nose normal. No congestion or rhinorrhea.     Mouth/Throat:     Mouth: Mucous membranes are moist.     Pharynx: Oropharynx is clear.  Eyes:     Extraocular Movements: Extraocular movements  intact.     Conjunctiva/sclera:  Conjunctivae normal.     Pupils: Pupils are equal, round, and reactive to light.  Neck:     Vascular: No carotid bruit.  Cardiovascular:     Rate and Rhythm: Normal rate and regular rhythm.     Pulses: Normal pulses.     Heart sounds: No murmur heard.    No friction rub. No gallop.  Pulmonary:     Effort: Pulmonary effort is normal.     Breath sounds: Normal breath sounds.  Abdominal:     General: Abdomen is flat. Bowel sounds are normal. There is no distension.     Palpations: Abdomen is soft. There is no mass.     Tenderness: There is no abdominal tenderness. There is no guarding or rebound.     Hernia: No hernia is present.  Musculoskeletal:        General: Normal range of motion.     Cervical back: Normal range of motion and neck supple.  Lymphadenopathy:     Cervical: No cervical adenopathy.  Skin:    General: Skin is warm and dry.     Capillary Refill: Capillary refill takes less than 2 seconds.  Neurological:     General: No focal deficit present.     Mental Status: She is alert and oriented to person, place, and time.  Psychiatric:        Mood and Affect: Mood normal.        Behavior: Behavior normal.        Thought Content: Thought content normal.        Judgment: Judgment normal.        Assessment & Plan:  1. Routine general medical examination at a health care facility (Primary) Today patient counseled on age appropriate routine health concerns for screening and prevention, each reviewed and up to date or declined. Immunizations reviewed and up to date or declined. Labs ordered and reviewed. Risk factors for depression reviewed and negative. Hearing function and visual acuity are intact. ADLs screened and addressed as needed. Functional ability and level of safety reviewed and appropriate. Education, counseling and referrals performed based on assessed risks today. Patient provided with a copy of personalized plan for preventive  services.   2. Mixed hyperlipidemia - Consider increase in statin  - CBC with Differential/Platelet; Future - Comprehensive metabolic panel with GFR; Future - Lipid panel; Future - TSH; Future  3. Essential hypertension - Controlled  - CBC with Differential/Platelet; Future - Comprehensive metabolic panel with GFR; Future - Lipid panel; Future - TSH; Future - losartan  (COZAAR ) 100 MG tablet; Take 1 tablet (100 mg total) by mouth daily.  Dispense: 90 tablet; Refill: 3  4. Anxiety and depression - Not controlled. Will keep her on current medication and refer to psychiatry  - CBC with Differential/Platelet; Future - Comprehensive metabolic panel with GFR; Future - Lipid panel; Future - TSH; Future - Ambulatory referral to Psychiatry  5. Pre-diabetes - Consider adding agent  - CBC with Differential/Platelet; Future - Comprehensive metabolic panel with GFR; Future - Lipid panel; Future - TSH; Future - Hemoglobin A1c; Future  6. Morbid obesity (HCC) - encouraged lifestyle modifications. Will try and get her covered for Zepbound d/t history of OSA - CBC with Differential/Platelet; Future - Comprehensive metabolic panel with GFR; Future - Lipid panel; Future - TSH; Future  7. Primary insomnia - Continue Restoril  PRN - CBC with Differential/Platelet; Future - Comprehensive metabolic panel with GFR; Future - Lipid panel; Future - TSH; Future  8. Tobacco use -  Encouraged to quit smoking  - CBC with Differential/Platelet; Future - Comprehensive metabolic panel with GFR; Future - Lipid panel; Future - TSH; Future  9. OSA (obstructive sleep apnea) - Will see if Zepbound is covered by her insurance.  - CBC with Differential/Platelet; Future - Comprehensive metabolic panel with GFR; Future - Lipid panel; Future - TSH; Future  10. Need for tetanus booster  - Tdap vaccine greater than or equal to 7yo IM  11. Need for influenza vaccination  - Flu vaccine trivalent PF,  6mos and older(Flulaval,Afluria,Fluarix,Fluzone)  12. Immunity status testing - She would like to find out if she had chicken pox as a child  - Varicella zoster antibody, IgG; Future  Darleene Shape, NP

## 2024-06-21 NOTE — Progress Notes (Signed)
 Subjective:    Patient ID: Holly Horn, female    DOB: 1971/05/23, 53 y.o.   MRN: [id number]  HPI Patient presents for yearly preventative medicine examination. She is a 53 year old female who  has a past medical history of Ectopic pregnancy, Essential hypertension, GERD (gastroesophageal reflux disease), and Hyperlipidemia.  Hyperlipidemia -managed with Crestor  20 mg daily.  She had a CT Calcium  score was 7 in 06/2023  Lab Results  Component Value Date   CHOL 195 04/13/2023   HDL 73 04/13/2023   LDLCALC 100 (H) 04/13/2023   TRIG 125 04/13/2023   CHOLHDL 2.7 04/13/2023   Hypertension-managed with Cozaar  100 mg daily.  She denies dizziness, lightheadedness, chest pain, or shortness of breath BP Readings from Last 3 Encounters:  06/21/24 130/86  07/25/23 134/85  07/21/23 136/88   Anxiety/depression-managed with Celexa  40 mg daily and busapr 15 mg daily. She reports that when she took the medication twice daily it caused her to have a  tense jaw and then she went back to taking it daily. She feels as though her anxiety is good but her depression is still an issue. She does not leave the house much and leads a sedentary lifestyle.   Pre diabetes/Obesity - Lab Results  Component Value Date   HGBA1C 5.9 (H) 04/13/2023   HGBA1C 5.9 06/18/2022   HGBA1C 5.8 07/29/2021   Wt Readings from Last 3 Encounters:  06/21/24 (!) 304 lb (137.9 kg)  07/25/23 (!) 301 lb (136.5 kg)  07/21/23 (!) 301 lb (136.5 kg)   Insomnia - takes Restoril  30 mg at bedtime PRN-  Tobacco Use  she restarted smoking.   OSA - Sleep study in 2023 showed sleep  apnea with AHI 9.4/hr. She has tried multiple masks but remains intolerant to CPAP.   All immunizations and health maintenance protocols were reviewed with the patient and needed orders were placed.   Appropriate screening laboratory values were ordered for the patient including screening of hyperlipidemia, renal function and hepatic  function.   Medication reconciliation,  past medical history, social history, problem list and allergies were reviewed in detail with the patient  Goals were established with regard to weight loss, exercise, and  diet in compliance with medications. She is not exercising or following a specific diet  Wt Readings from Last 3 Encounters:  06/21/24 (!) 304 lb (137.9 kg)  07/25/23 (!) 301 lb (136.5 kg)  07/21/23 (!) 301 lb (136.5 kg)   She is scheduled for her mammogram and pap in December.     Review of Systems  Constitutional: Negative.   HENT: Negative.    Eyes: Negative.   Respiratory: Negative.    Cardiovascular: Negative.   Gastrointestinal: Negative.   Endocrine: Negative.   Genitourinary: Negative.   Musculoskeletal: Negative.   Skin: Negative.   Allergic/Immunologic: Negative.   Neurological: Negative.   Hematological: Negative.   Psychiatric/Behavioral:  Positive for decreased concentration, dysphoric mood and sleep disturbance.    Past Medical History:  Diagnosis Date   Ectopic pregnancy    x 3   Essential hypertension    GERD (gastroesophageal reflux disease)    Hyperlipidemia     Social History   Socioeconomic History   Marital status: Significant Other    Spouse name: Not on file   Number of children: Not on file   Years of education: Not on file   Highest education level: Bachelor's degree (e.g., BA, AB, BS)  Occupational History   Not on file  Tobacco Use   Smoking status: Some Days    Current packs/day: 0.50    Types: Cigarettes   Smokeless tobacco: Never   Tobacco comments:    Socially 3-4 times a week   Vaping Use   Vaping status: Never Used  Substance and Sexual Activity   Alcohol use: Yes    Alcohol/week: 12.0 standard drinks of alcohol    Types: 12 Standard drinks or equivalent per week   Drug use: No   Sexual activity: Not on file  Other Topics Concern   Not on file  Social History Narrative   Not on file   Social Drivers of  Health   Financial Resource Strain: Low Risk  (06/18/2024)   Overall Financial Resource Strain (CARDIA)    Difficulty of Paying Living Expenses: Not very hard  Food Insecurity: No Food Insecurity (06/18/2024)   Hunger Vital Sign    Worried About Running Out of Food in the Last Year: Never true    Ran Out of Food in the Last Year: Never true  Transportation Needs: No Transportation Needs (06/18/2024)   PRAPARE - Administrator, Civil Service (Medical): No    Lack of Transportation (Non-Medical): No  Physical Activity: Inactive (08/22/2023)   Exercise Vital Sign    Days of Exercise per Week: 1 day    Minutes of Exercise per Session: 0 min  Stress: Stress Concern Present (08/22/2023)   [employer] of Occupational Health - Occupational Stress Questionnaire    Feeling of Stress : Very much  Social Connections: Socially Isolated (06/18/2024)   Social Connection and Isolation Panel    Frequency of Communication with Friends and Family: Once a week    Frequency of Social Gatherings with Friends and Family: Once a week    Attends Religious Services: Never    Database Administrator or Organizations: No    Attends Engineer, Structural: Not on file    Marital Status: Living with partner  Intimate Partner Violence: Not on file    Past Surgical History:  Procedure Laterality Date   BIOPSY  01/06/2021   Procedure: BIOPSY;  Surgeon: Mansouraty, Aloha Raddle., MD;  Location: [facility];  Service: Gastroenterology;;   COLONOSCOPY  06/2020   ECTOPIC PREGNANCY SURGERY     ENDOSCOPIC MUCOSAL RESECTION  08/14/2020   Procedure: ENDOSCOPIC MUCOSAL RESECTION;  Surgeon: Wilhelmenia Aloha Raddle., MD;  Location: [facility] END[facility]COPY;  Service: Gastroenterology;;   ESOPHAGOGASTRODUODENOSCOPY N/A 01/06/2021   Procedure: ESOPHAGOGASTRODUODENOSCOPY (EGD);  Surgeon: Wilhelmenia Aloha Raddle., MD;  Location: [facility];  Service: Gastroenterology;  Laterality: N/A;   EUS N/A 08/14/2020   Procedure:  LOWER ENDOSCOPIC ULTRASOUND (EUS);  Surgeon: Wilhelmenia Aloha Raddle., MD;  Location: [facility] ENDOSCOPY;  Service: Gastroenterology;  Laterality: N/A;   EUS N/A 01/06/2021   Procedure: UPPER ENDOSCOPIC ULTRASOUND (EUS) RADIAL;  Surgeon: Wilhelmenia Aloha Raddle., MD;  Location: [facility];  Service: Gastroenterology;  Laterality: N/A;   EUS N/A 07/25/2023   Procedure: LOWER ENDOSCOPIC ULTRASOUND (EUS);  Surgeon: Wilhelmenia Aloha Raddle., MD;  Location: [facility];  Service: Gastroenterology;  Laterality: N/A;   FINE NEEDLE ASPIRATION  01/06/2021   Procedure: FINE NEEDLE ASPIRATION (FNA) LINEAR;  Surgeon: Wilhelmenia Aloha Raddle., MD;  Location: [facility];  Service: Gastroenterology;;   ENID SIGMOIDOSCOPY N/A 08/14/2020   Procedure: ENID MORIN;  Surgeon: Wilhelmenia Aloha Raddle., MD;  Location: [facility] ENDOSCOPY;  Service: Gastroenterology;  Laterality: N/A;   FLEXIBLE SIGMOIDOSCOPY N/A 07/25/2023   Procedure: FLEXIBLE SIGMOIDOSCOPY;  Surgeon: Wilhelmenia Aloha Raddle.,  MD;  Location: [facility];  Service: Gastroenterology;  Laterality: N/A;   HEMOSTASIS CLIP PLACEMENT  08/14/2020   Procedure: HEMOSTASIS CLIP PLACEMENT;  Surgeon: Wilhelmenia Aloha Raddle., MD;  Location: [facility] ENDOSCOPY;  Service: Gastroenterology;;   SUBMUCOSAL LIFTING INJECTION  08/14/2020   Procedure: SUBMUCOSAL LIFTING INJECTION;  Surgeon: Wilhelmenia Aloha Raddle., MD;  Location: [facility] ENDOSCOPY;  Service: Gastroenterology;;   [facility]  2008 & 2010    Family History  Problem Relation Age of Onset   Hypertension Mother    Osteoarthritis Father    Hypertension Brother    Cancer Maternal Grandmother        unknown   Cancer Maternal Grandfather        unknown   Cancer Paternal Grandmother        unknown   Cancer Paternal Grandfather        unknowon   Breast cancer Other    Colon cancer Neg Hx    Colon polyps Neg Hx    Esophageal cancer Neg Hx    Rectal cancer Neg Hx    Stomach cancer Neg Hx    Inflammatory  bowel disease Neg Hx    Liver disease Neg Hx    Pancreatic cancer Neg Hx     Allergies  Allergen Reactions   Morphine Sulfate Shortness Of Breath and Nausea And Vomiting   Lisinopril  Cough   Progesterone Nausea Only    Current Outpatient Medications on File Prior to Visit  Medication Sig Dispense Refill   aspirin  EC 81 MG tablet Take 1 tablet (81 mg total) by mouth daily. Swallow whole. 90 tablet 3   busPIRone  (BUSPAR ) 15 MG tablet Take 1 tablet (15 mg total) by mouth 2 (two) times daily. 180 tablet 1   citalopram  (CELEXA ) 40 MG tablet TAKE 1 TABLET BY MOUTH EVERY DAY 90 tablet 1   ibuprofen (ADVIL) 200 MG tablet Take 400 mg by mouth every 8 (eight) hours as needed (pain).     omeprazole  (PRILOSEC) 20 MG capsule TAKE 1 CAPSULE BY MOUTH EVERY DAY 90 capsule 1   ondansetron  (ZOFRAN -ODT) 8 MG disintegrating tablet Take 1 tablet (8 mg total) by mouth every 8 (eight) hours as needed for nausea or vomiting. 20 tablet 0   rosuvastatin  (CRESTOR ) 20 MG tablet TAKE 1 TABLET BY MOUTH EVERY DAY 30 tablet 0   temazepam  (RESTORIL ) 30 MG capsule TAKE 1 CAPSULE BY MOUTH EVERY DAY AT BEDTIME AS NEEDED FOR SLEEP 30 capsule 2   No current facility-administered medications on file prior to visit.    BP 130/86   Temp 98.2 F (36.8 C) (Oral)   Ht 5' 6 (1.676 m)   Wt (!) 304 lb (137.9 kg)   LMP 08/23/2013   BMI 49.07 kg/m       Objective:   Physical Exam Vitals and nursing note reviewed.  Constitutional:      General: She is not in acute distress.    Appearance: Normal appearance. She is obese. She is not ill-appearing.  HENT:     Head: Normocephalic and atraumatic.     Right Ear: Tympanic membrane, ear canal and external ear normal. There is no impacted cerumen.     Left Ear: Tympanic membrane, ear canal and external ear normal. There is no impacted cerumen.     Nose: Nose normal. No congestion or rhinorrhea.     Mouth/Throat:     Mouth: Mucous membranes are moist.     Pharynx:  Oropharynx is clear.  Eyes:  Extraocular Movements: Extraocular movements intact.     Conjunctiva/sclera: Conjunctivae normal.     Pupils: Pupils are equal, round, and reactive to light.  Neck:     Vascular: No carotid bruit.  Cardiovascular:     Rate and Rhythm: Normal rate and regular rhythm.     Pulses: Normal pulses.     Heart sounds: No murmur heard.    No friction rub. No gallop.  Pulmonary:     Effort: Pulmonary effort is normal.     Breath sounds: Normal breath sounds.  Abdominal:     General: Abdomen is flat. Bowel sounds are normal. There is no distension.     Palpations: Abdomen is soft. There is no mass.     Tenderness: There is no abdominal tenderness. There is no guarding or rebound.     Hernia: No hernia is present.  Musculoskeletal:        General: Normal range of motion.     Cervical back: Normal range of motion and neck supple.  Lymphadenopathy:     Cervical: No cervical adenopathy.  Skin:    General: Skin is warm and dry.     Capillary Refill: Capillary refill takes less than 2 seconds.  Neurological:     General: No focal deficit present.     Mental Status: She is alert and oriented to person, place, and time.  Psychiatric:        Mood and Affect: Mood normal.        Behavior: Behavior normal.        Thought Content: Thought content normal.        Judgment: Judgment normal.           Assessment & Plan:  1. Routine general medical examination at a health care facility (Primary) Today patient counseled on age appropriate routine health concerns for screening and prevention, each reviewed and up to date or declined. Immunizations reviewed and up to date or declined. Labs ordered and reviewed. Risk factors for depression reviewed and negative. Hearing function and visual acuity are intact. ADLs screened and addressed as needed. Functional ability and level of safety reviewed and appropriate. Education, counseling and referrals performed based on  assessed risks today. Patient provided with a copy of personalized plan for preventive services.     2. Mixed hyperlipidemia - Consider increase in statin  - CBC with Differential/Platelet; Future - Comprehensive metabolic panel with GFR; Future - Lipid panel; Future - TSH; Future   3. Essential hypertension - Controlled  - CBC with Differential/Platelet; Future - Comprehensive metabolic panel with GFR; Future - Lipid panel; Future - TSH; Future - losartan  (COZAAR ) 100 MG tablet; Take 1 tablet (100 mg total) by mouth daily.  Dispense: 90 tablet; Refill: 3   4. Anxiety and depression - Not controlled. Will keep her on current medication and refer to psychiatry  - CBC with Differential/Platelet; Future - Comprehensive metabolic panel with GFR; Future - Lipid panel; Future - TSH; Future - Ambulatory referral to Psychiatry   5. Pre-diabetes - Consider adding agent  - CBC with Differential/Platelet; Future - Comprehensive metabolic panel with GFR; Future - Lipid panel; Future - TSH; Future - Hemoglobin A1c; Future   6. Morbid obesity (HCC) - encouraged lifestyle modifications. Will try and get her covered for Zepbound d/t history of OSA - CBC with Differential/Platelet; Future - Comprehensive metabolic panel with GFR; Future - Lipid panel; Future - TSH; Future   7. Primary insomnia - Continue Restoril  PRN - CBC with  Differential/Platelet; Future - Comprehensive metabolic panel with GFR; Future - Lipid panel; Future - TSH; Future   8. Tobacco use - Encouraged to quit smoking  - CBC with Differential/Platelet; Future - Comprehensive metabolic panel with GFR; Future - Lipid panel; Future - TSH; Future   9. OSA (obstructive sleep apnea) - Will see if Zepbound is covered by her insurance.  - CBC with Differential/Platelet; Future - Comprehensive metabolic panel with GFR; Future - Lipid panel; Future - TSH; Future   10. Need for tetanus booster   - Tdap vaccine  greater than or equal to 7yo IM   11. Need for influenza vaccination   - Flu vaccine trivalent PF, 6mos and older(Flulaval,Afluria,Fluarix,Fluzone)   12. Immunity status testing - She would like to find out if she had chicken pox as a child  - Varicella zoster antibody, IgG; Future   Darleene Shape, NP

## 2024-06-22 LAB — VARICELLA ZOSTER ANTIBODY, IGG: Varicella IgG: 26.5 {s_co_ratio}

## 2024-07-13 ENCOUNTER — Other Ambulatory Visit (HOSPITAL_BASED_OUTPATIENT_CLINIC_OR_DEPARTMENT_OTHER): Payer: Self-pay | Admitting: Cardiology

## 2024-07-17 ENCOUNTER — Encounter: Payer: Self-pay | Admitting: Adult Health

## 2024-07-19 ENCOUNTER — Other Ambulatory Visit: Payer: Self-pay | Admitting: Adult Health

## 2024-07-19 DIAGNOSIS — F5101 Primary insomnia: Secondary | ICD-10-CM

## 2024-07-19 MED ORDER — TEMAZEPAM 30 MG PO CAPS: ORAL_CAPSULE | ORAL | 2 refills | Status: DC

## 2024-07-19 MED ORDER — TEMAZEPAM 30 MG PO CAPS: ORAL_CAPSULE | ORAL | 2 refills | Status: AC

## 2024-07-31 DIAGNOSIS — N951 Menopausal and female climacteric states: Secondary | ICD-10-CM | POA: Diagnosis not present

## 2024-07-31 DIAGNOSIS — Z01419 Encounter for gynecological examination (general) (routine) without abnormal findings: Secondary | ICD-10-CM | POA: Diagnosis not present

## 2024-07-31 DIAGNOSIS — Z1231 Encounter for screening mammogram for malignant neoplasm of breast: Secondary | ICD-10-CM | POA: Diagnosis not present

## 2024-07-31 DIAGNOSIS — F520 Hypoactive sexual desire disorder: Secondary | ICD-10-CM | POA: Diagnosis not present

## 2024-08-22 ENCOUNTER — Encounter (HOSPITAL_BASED_OUTPATIENT_CLINIC_OR_DEPARTMENT_OTHER): Payer: Self-pay | Admitting: Cardiology

## 2024-08-23 ENCOUNTER — Telehealth: Payer: Self-pay | Admitting: Cardiology

## 2024-08-23 MED ORDER — ROSUVASTATIN CALCIUM 20 MG PO TABS: 20.0000 mg | ORAL_TABLET | Freq: Every day | ORAL | 0 refills | Status: DC

## 2024-08-23 NOTE — Telephone Encounter (Signed)
*  STAT* If patient is at the pharmacy, call can be transferred to refill team.   1. Which medications need to be refilled? (please list name of each medication and dose if known)  rosuvastatin  (CRESTOR ) 20 MG tablet   2. Which pharmacy/location (including street and city if local pharmacy) is medication to be sent to? [id number] - [place], [place] - [address] AT [address]    3. Do they need a 30 day or 90 day supply?  90 day supply

## 2024-08-29 MED ORDER — ROSUVASTATIN CALCIUM 20 MG PO TABS: 20.0000 mg | ORAL_TABLET | Freq: Every day | ORAL | 0 refills | Status: AC

## 2024-08-29 NOTE — Telephone Encounter (Signed)
 Pt's medication was sent to pt's pharmacy as requested. Confirmation received.

## 2024-11-06 ENCOUNTER — Ambulatory Visit (HOSPITAL_BASED_OUTPATIENT_CLINIC_OR_DEPARTMENT_OTHER): Admitting: Cardiology
